# Patient Record
Sex: Female | Born: 1980
Health system: Southern US, Community
[De-identification: ages and names within clinical notes are randomized; demographics above are authoritative.]

## PROBLEM LIST (undated history)

## (undated) DIAGNOSIS — G35 Multiple sclerosis: Secondary | ICD-10-CM

## (undated) DIAGNOSIS — IMO0001 Reserved for inherently not codable concepts without codable children: Secondary | ICD-10-CM

## (undated) DIAGNOSIS — R002 Palpitations: Secondary | ICD-10-CM

## (undated) DIAGNOSIS — I639 Cerebral infarction, unspecified: Secondary | ICD-10-CM

## (undated) DIAGNOSIS — I10 Essential (primary) hypertension: Secondary | ICD-10-CM

## (undated) DIAGNOSIS — F419 Anxiety disorder, unspecified: Secondary | ICD-10-CM

## (undated) DIAGNOSIS — E7800 Pure hypercholesterolemia, unspecified: Secondary | ICD-10-CM

## (undated) DIAGNOSIS — E785 Hyperlipidemia, unspecified: Secondary | ICD-10-CM

## (undated) DIAGNOSIS — Z8719 Personal history of other diseases of the digestive system: Secondary | ICD-10-CM

## (undated) HISTORY — PX: LAPAROSCOPIC TUBAL LIGATION: SUR803

## (undated) HISTORY — DX: Personal history of other diseases of the digestive system: Z87.19

## (undated) HISTORY — PX: BLADDER NECK SUSPENSION: SHX1240

## (undated) HISTORY — DX: Anxiety disorder, unspecified: F41.9

## (undated) HISTORY — DX: Palpitations: R00.2

## (undated) HISTORY — PX: ABDOMINAL HYSTERECTOMY: SHX81

## (undated) HISTORY — DX: Hyperlipidemia, unspecified: E78.5

## (undated) HISTORY — PX: CHOLECYSTECTOMY: SHX55

---

## 2008-03-03 ENCOUNTER — Encounter (INDEPENDENT_AMBULATORY_CARE_PROVIDER_SITE_OTHER): Payer: Self-pay | Admitting: Urology

## 2008-03-03 ENCOUNTER — Ambulatory Visit (HOSPITAL_BASED_OUTPATIENT_CLINIC_OR_DEPARTMENT_OTHER): Admission: RE | Admit: 2008-03-03 | Discharge: 2008-03-03 | Payer: Self-pay | Admitting: Urology

## 2009-04-06 ENCOUNTER — Emergency Department (HOSPITAL_COMMUNITY): Admission: EM | Admit: 2009-04-06 | Discharge: 2009-04-06 | Payer: Self-pay | Admitting: Emergency Medicine

## 2010-09-04 LAB — URINALYSIS, ROUTINE W REFLEX MICROSCOPIC
Bilirubin Urine: NEGATIVE
Glucose, UA: NEGATIVE mg/dL
Ketones, ur: NEGATIVE mg/dL
Leukocytes, UA: NEGATIVE
Nitrite: NEGATIVE
pH: 6 (ref 5.0–8.0)

## 2010-09-04 LAB — URINE CULTURE

## 2010-09-04 LAB — URINE MICROSCOPIC-ADD ON

## 2010-09-04 LAB — PREGNANCY, URINE: Preg Test, Ur: NEGATIVE

## 2010-10-15 NOTE — Op Note (Signed)
[name], Lindsey Phelps             ACCOUNT NO.:  [id number]   MEDICAL RECORD NO.:  [id number]          PATIENT TYPE:  AMB   LOCATION:  [facility]                         FACILITY:  [facility]   PHYSICIAN:  Ronald L. Earlene Plater, M.D.  DATE OF BIRTH:  [birth year]   DATE OF PROCEDURE:  03/03/2008  DATE OF DISCHARGE:                               OPERATIVE REPORT   DIAGNOSIS:  Possible interstitial cystitis.   OPERATIVE PROCEDURE:  Cystourethroscopy, hydraulic bladder distention,  bladder biopsy.   SURGEON:  Lucrezia Starch. Earlene Plater, M.D.   ANESTHESIA:  LMA.   ESTIMATED BLOOD LOSS:  Negligible.   TUBES:  None.   COMPLICATIONS:  None.   FINDINGS:  A 600 mL capacity bladder at 80 cm water pressure, submucosal  glomerulations noted after distention.   INDICATIONS FOR PROCEDURE:  Lindsey Phelps is a lovely 30 year old white female  who presents with urinary symptoms.  She has had recurrent cystitis  since she was quite young.  She has had 2 episodes of gross hematuria.  She has also had suprapubic pressure relieved by voiding; urgency,  frequency and some dysuria.  Her workup has been essentially negative  and we are suspicious of interstitial cystitis.  After understanding the  risks, benefits and alternatives, she has elected to undergo cysto,  hydraulic bladder distention and bladder biopsy for both therapeutic and  diagnostic reasons.   DESCRIPTION OF PROCEDURE:  The patient was placed in supine position.  After appropriate LMA anesthesia, was placed in the dorsal lithotomy  position and prepped and draped with Betadine in a sterile fashion.  Cystourethroscopy was done with a 22.5-French Olympus panendoscope.  Utilizing the 12 and 70-degree lenses, the bladder was carefully  inspected and noted be without lesions.  Efflux of clear urine was noted  from the normally-placed ureteral orifices bilaterally.  There was some  mild circumferential urethritis.  The bladder was then carefully  distended to 80 cm of  water pressure with sterile water.  She had a  total capacity of 600 mL.  The bladder was drained.  Multiple submucosal  glomerulations were noted throughout the bladder and several areas of  mild cracking and bleeding were noted, consistent with interstitial  cystitis.  A biopsy was taken from the posterior midline with  cold cup biopsy forceps and submitted to pathology, and the base was  cauterized with Bugbee coagulation cautery.  Reinspection revealed there  were no other lesions and no significant bleeding was noted.  The  bladder was drained.  The panendoscope was removed and the patient was  taken to the recovery room stable.      Ronald L. Earlene Plater, M.D.  Electronically Signed     RLD/MEDQ  D:  03/03/2008  T:  03/04/2008  Job:  [id number]

## 2010-10-30 ENCOUNTER — Other Ambulatory Visit (HOSPITAL_COMMUNITY): Payer: Self-pay | Admitting: Family Medicine

## 2010-10-30 ENCOUNTER — Ambulatory Visit (HOSPITAL_COMMUNITY)
Admission: RE | Admit: 2010-10-30 | Discharge: 2010-10-30 | Disposition: A | Discharge status: Home / Self Care | Payer: Managed Care, Other (non HMO) | Source: Ambulatory Visit | Attending: Family Medicine | Admitting: Family Medicine

## 2010-10-30 DIAGNOSIS — R1031 Right lower quadrant pain: Secondary | ICD-10-CM

## 2010-10-30 DIAGNOSIS — N83209 Unspecified ovarian cyst, unspecified side: Secondary | ICD-10-CM | POA: Insufficient documentation

## 2010-10-30 DIAGNOSIS — R932 Abnormal findings on diagnostic imaging of liver and biliary tract: Secondary | ICD-10-CM | POA: Insufficient documentation

## 2010-10-30 MED ORDER — IOHEXOL 300 MG/ML  SOLN
100.0000 mL | Freq: Once | INTRAMUSCULAR | Status: AC | PRN
  Administered 2010-10-30: 100 mL via INTRAVENOUS

## 2010-12-17 ENCOUNTER — Encounter (INDEPENDENT_AMBULATORY_CARE_PROVIDER_SITE_OTHER): Payer: Self-pay | Admitting: Internal Medicine

## 2010-12-17 ENCOUNTER — Ambulatory Visit (INDEPENDENT_AMBULATORY_CARE_PROVIDER_SITE_OTHER): Payer: Managed Care, Other (non HMO) | Admitting: Internal Medicine

## 2010-12-17 DIAGNOSIS — K5190 Ulcerative colitis, unspecified, without complications: Secondary | ICD-10-CM

## 2010-12-18 NOTE — Consult Note (Unsigned)
NAME:  Lindsey Phelps, Lindsey Phelps                    ACCOUNT NO.:  MEDICAL RECORD NO.:  [id number]  LOCATION:  [facility]                           FACILITY:  [facility]  PHYSICIAN:  Lionel December, M.D.    DATE OF BIRTH:  05/28/1981  DATE OF CONSULTATION: DATE OF DISCHARGE:  10/30/2010                                CONSULTATION   REASON FOR CONSULTATION:  Colitis.  HISTORY OF PRESENT ILLNESS:  Lindsey Phelps is a 30 year old female referred to our office by Dr. Phillips Odor, for colitis.  At the first of June of this year, Bonnnie began to have right lower quadrant pain and back pain. She thought this was a kidney stone.  She underwent a CT of the abdomen which revealed questionable wall thickening and minimal pericolic inflammatory changes of the sigmoid and rectum, raising question of colitis.  No masses, adenopathy, or free fluid.  She had a 2.5 x 2.1 cm right ovarian cyst.  She was seen by her GYN and was told she had a normal exam.  Her pain lasted approximately 3 days.  During that time, she had multiple stools and also had diarrhea.  She does tell me she keeps diarrhea most of the time.  She has had diarrhea since 2011, after undergoing a cholecystectomy.  Today, she tells me she does have right- sided abdominal pain after she eats.  When she has this pain, she will usually have a bowel movement.  She is having anywhere from 3-6 stools a day and they are loose.  Sometimes they are watery.  She does tell me if she takes pain medicines, her stools are normal.  She has had no severe abdominal pain for month.  She does have a gassy feeling.  Her appetite has remained good.  There has been no weight loss.  No recent fever, no melena, or rectal bleeding.  HOME MEDICATIONS: 1. Nitrofurantoin 50 mg one a day. 2. Diazepam 5 mg at night. 3. Wellbutrin 150 mg b.i.d. for smoking cessation.  SURGERIES:  She underwent a hysterectomy in August of 2011, she tells me an incompetent cervix.  She had a cholecystectomy in  2009.  She had a bilateral tubal ligation.  She had a bladder distention in 2009.  MEDICAL HISTORY:  Interstitial cystitis.  FAMILY HISTORY:  Her mother and father are alive in good health.  Two brothers in good health with a history of coronary artery disease.  SOCIAL HISTORY:  She is married.  She is employed by [employer].  She smokes less than a pack a day x15 years.  She is however trying to quit smoking.  She occasionally drinks alcohol.  No children.  There is no family history of Crohn's.  Her father has history of colon polyps and diverticulitis.  OBJECTIVE:  VITAL SIGNS:  Her height is 5 feet 6 inches, her weight is 142.9, her blood pressure is 96/72, her temperature is 99.2, her pulse is 68. GENERAL:  She has natural teeth. HEENT:  Her oral mucosa is moist.  There are no lesions.  Her conjunctivae are pink.  Her sclerae are anicteric.  Her thyroid is normal.  There is no cervical lymphadenopathy.  LUNGS:  Clear. HEART:  Regular rate and rhythm. ABDOMEN:  Soft.  Bowel sounds are positive.  I do not feel any masses. She has very, very slight tenderness on palpation to the right lower quadrant. EXTREMITIES:  There is no edema to her lower extremities.  Her stool was guaiac negative today.  ASSESSMENT:  [name] is a 30 year old female presenting with an abnormal CT which revealed colitis.  An inflammatory disease such as Crohn's or ulcerative colitis needs to be ruled out.  I did discuss this case with Dr. Karilyn Cota.  RECOMMENDATIONS:  We will schedule a colonoscopy in the near future. The risk and benefits were reviewed with the patient and she is agreeable.  She will also start dicyclomine 10 mg twice a day #60.    ______________________________ Dorene Ar, NP   ______________________________ Lionel December, M.D.    TS/MEDQ  D:  12/17/2010  T:  12/18/2010  Job:  [id number]  cc:   Dr. Phillips Odor

## 2011-01-22 MED ORDER — SODIUM CHLORIDE 0.45 % IV SOLN
Freq: Once | INTRAVENOUS | Status: AC
  Administered 2011-01-23: 1000 mL via INTRAVENOUS

## 2011-01-23 ENCOUNTER — Encounter (HOSPITAL_COMMUNITY): Payer: Self-pay | Admitting: *Deleted

## 2011-01-23 ENCOUNTER — Encounter (HOSPITAL_COMMUNITY)
Admission: RE | Disposition: A | Discharge status: Home / Self Care | Payer: Self-pay | Source: Ambulatory Visit | Attending: Internal Medicine

## 2011-01-23 ENCOUNTER — Ambulatory Visit (HOSPITAL_COMMUNITY)
Admission: RE | Admit: 2011-01-23 | Discharge: 2011-01-23 | Disposition: A | Discharge status: Home / Self Care | Payer: Managed Care, Other (non HMO) | Source: Ambulatory Visit | Attending: Internal Medicine | Admitting: Internal Medicine

## 2011-01-23 DIAGNOSIS — J449 Chronic obstructive pulmonary disease, unspecified: Secondary | ICD-10-CM | POA: Insufficient documentation

## 2011-01-23 DIAGNOSIS — R859 Unspecified abnormal finding in specimens from digestive organs and abdominal cavity: Secondary | ICD-10-CM

## 2011-01-23 DIAGNOSIS — I10 Essential (primary) hypertension: Secondary | ICD-10-CM | POA: Insufficient documentation

## 2011-01-23 DIAGNOSIS — E119 Type 2 diabetes mellitus without complications: Secondary | ICD-10-CM | POA: Insufficient documentation

## 2011-01-23 DIAGNOSIS — R197 Diarrhea, unspecified: Secondary | ICD-10-CM | POA: Insufficient documentation

## 2011-01-23 DIAGNOSIS — J4489 Other specified chronic obstructive pulmonary disease: Secondary | ICD-10-CM | POA: Insufficient documentation

## 2011-01-23 DIAGNOSIS — R933 Abnormal findings on diagnostic imaging of other parts of digestive tract: Secondary | ICD-10-CM | POA: Insufficient documentation

## 2011-01-23 DIAGNOSIS — K5289 Other specified noninfective gastroenteritis and colitis: Secondary | ICD-10-CM

## 2011-01-23 HISTORY — PX: COLONOSCOPY: SHX5424

## 2011-01-23 SURGERY — COLONOSCOPY
Anesthesia: Moderate Sedation

## 2011-01-23 MED ORDER — DICYCLOMINE HCL 10 MG PO CAPS: 10.0000 mg | ORAL_CAPSULE | Freq: Three times a day (TID) | ORAL | Status: DC

## 2011-01-23 MED ORDER — MIDAZOLAM HCL 5 MG/5ML IJ SOLN
INTRAMUSCULAR | Status: AC
  Filled 2011-01-23: qty 10

## 2011-01-23 MED ORDER — FENTANYL CITRATE 0.05 MG/ML IJ SOLN
INTRAMUSCULAR | Status: AC
  Filled 2011-01-23: qty 2

## 2011-01-23 MED ORDER — FENTANYL CITRATE 0.05 MG/ML IJ SOLN
INTRAMUSCULAR | Status: DC | PRN
  Administered 2011-01-23 (×4): 25 ug via INTRAVENOUS

## 2011-01-23 MED ORDER — MIDAZOLAM HCL 5 MG/5ML IJ SOLN
INTRAMUSCULAR | Status: DC | PRN
  Administered 2011-01-23 (×5): 2 mg via INTRAVENOUS

## 2011-01-23 NOTE — Op Note (Signed)
COLONOSCOPY PROCEDURE REPORT  PATIENT:  Lindsey Phelps  MR#:  [id number] Birthdate:  Feb 14, 1981, 30 y.o., female Endoscopist:  Dr. Malissa Hippo, MD Referred By:  Loren Racer, MD  Procedure Date: 01/23/2011  Procedure:   Colonoscopy  Indications:  Diarrhea and thickened segments of colon on CT; negative stool studies.  Informed Consent:  Procedure and risks were reviewed with the patient and informed consent was obtained.  Medications:  Fentanyl 100 mcg IV. Versed 10  mg IV  Description of procedure:  After a digital rectal exam was performed, that colonoscope was advanced from the anus through the rectum and colon to the area of the cecum, ileocecal valve and appendiceal orifice. The cecum was deeply intubated. These structures were well-seen and photographed for the record. From the level of the cecum and ileocecal valve, the scope was slowly and cautiously withdrawn. The mucosal surfaces were carefully surveyed utilizing scope tip to flexion to facilitate fold flattening as needed. The scope was pulled down into the rectum where a thorough exam including retroflexion was performed.  Findings:   Prep excellent. Normal mucosa of terminal ileum. Normal colorectal mucosa. Normal anorectal junction.  Therapeutic/Diagnostic Maneuvers Performed:  None  Complications:  None  Cecal Withdrawal Time:  8 minutes  Impression:  Normal terminal ileum. Normal colonoscopy. She possibly had self-limiting enterocolitis which would account for CT findings. Current symptoms  secondary to irritable bowel syndrome.  Recommendations:  Resume usual medications and diet. Dicyclomine 10 mg before each meal. Office visit in 8 week  REHMAN,NAJEEB U  01/23/2011 4:13 PM  CC: Dr. Colette Ribas, MD

## 2011-01-23 NOTE — H&P (Signed)
Lindsey Phelps is an 30 y.o. female.   Chief Complaint: Patient is here for colonoscopy HPI: Patient is 29 year old Asian female who is here for diagnostic colonoscopy. In May of this year she developed pain in right side of her abdomen. She thought she had kidney stone. She went to emergency room and had abdominopelvic CT which showed changes of colitis no evidence of kidney stone to her pain has resolved however she still has postprandial diarrhea therefore undergoing this exam to rule out inflammatory bowel disease also has intermittent nocturnal bowel movement. She denies rectal bleeding or melena. she says she has gained few pounds since she had hysterectomy one year ago . She does not take any NSAIDs on regular basis. Him history is negative for inflammatory bowel disease.  History reviewed. No pertinent past medical history.  Past Surgical History  Procedure Date  . Abdominal hysterectomy   . Cholecystectomy   . Bladder neck suspension   . Laparoscopic tubal ligation     Family History  Problem Relation Age of Onset  . Diabetes Mother   . Hypertension Mother   . Coronary artery disease Mother   . COPD Mother   . Diabetes Father   . Hypertension Father   . Colon polyps Father    Social History:  reports that she has been smoking Cigarettes.  She has a 7.5 pack-year smoking history. She does not have any smokeless tobacco history on file. She reports that she drinks about .6 ounces of alcohol per week. She reports that she does not use illicit drugs.  Allergies:  Allergies  Allergen Reactions  . Darvocet (Propoxyphene N-Acetaminophen)   . Demerol     Medications Prior to Admission  Medication Dose Route Frequency Provider Last Rate Last Dose  . 0.45 % sodium chloride infusion   Intravenous Once Malissa Hippo, MD 20 mL/hr at 01/23/11 1429 1,000 mL at 01/23/11 1429  . midazolam (VERSED) 5 MG/5ML injection            Medications Prior to Admission  Medication Sig Dispense  Refill  . buPROPion (WELLBUTRIN SR) 150 MG 12 hr tablet Take 150 mg by mouth 2 (two) times daily.        . nitrofurantoin (MACRODANTIN) 50 MG capsule Take 50 mg by mouth 1 day or 1 dose. As needed       . diazepam (VALIUM) 5 MG tablet Take 5 mg by mouth at bedtime as needed.          No results found for this or any previous visit (from the past 48 hour(s)). No results found.  Review of Systems  Constitutional: Negative for fever and weight loss.  Gastrointestinal: Positive for diarrhea. Negative for constipation, blood in stool and melena.    Blood pressure 118/74, pulse 82, temperature 98.6 F (37 C), temperature source Oral, resp. rate 16, height 5\' 6"  (1.676 m), weight 142 lb (64.411 kg), SpO2 100.00%. Physical Exam  Constitutional: She appears well-developed and well-nourished.  HENT:  Mouth/Throat: Oropharynx is clear and moist.  Eyes: Conjunctivae are normal. No scleral icterus.  Neck: No thyromegaly present.  Cardiovascular: Normal rate, regular rhythm and normal heart sounds.   No murmur heard. Respiratory: Breath sounds normal.  GI: Soft. She exhibits no distension and no mass. There is no tenderness.       Has tattoo in RUQ  Lymphadenopathy:    She has no cervical adenopathy.  Neurological: She is alert.  Skin: Skin is warm and dry.  Assessment/Plan Diarrhea and abnormal abdominal CT revealing changes of colitis. Colonoscopy  REHMAN,NAJEEB U 01/23/2011, 3:40 PM

## 2011-01-28 ENCOUNTER — Encounter (HOSPITAL_COMMUNITY): Payer: Self-pay | Admitting: Internal Medicine

## 2011-03-04 LAB — POCT PREGNANCY, URINE: Preg Test, Ur: NEGATIVE

## 2011-03-10 ENCOUNTER — Encounter (INDEPENDENT_AMBULATORY_CARE_PROVIDER_SITE_OTHER): Payer: Self-pay | Admitting: Internal Medicine

## 2011-03-10 ENCOUNTER — Ambulatory Visit (INDEPENDENT_AMBULATORY_CARE_PROVIDER_SITE_OTHER): Payer: Managed Care, Other (non HMO) | Admitting: Internal Medicine

## 2011-03-10 VITALS — BP 98/52 | HR 80 | Temp 98.7°F | Ht 66.0 in | Wt 141.0 lb

## 2011-03-10 DIAGNOSIS — K589 Irritable bowel syndrome without diarrhea: Secondary | ICD-10-CM

## 2011-03-10 NOTE — Progress Notes (Signed)
Subjective:     Patient ID: Lindsey Phelps, female   DOB: 06-02-1981, 30 y.o.   MRN: [id number]  HPI  Lindsey Phelps is a 30 yr old female here today for f/u after recently undergoing a colonoscopy for colitis. She had been having rt lower quadrant pain and  Back pain.  She underwent a CT scan of the abdomen which revealed questionable wall thickening and minimal pericolic colitis.  She tells me the Dicyclomine is helping. She really does not have pain since starting the Dicyclomine.  She is having a watery stool about once every other weeks. Her stools have firmed up.  She is taking the Dicyclomine on a prn basis. She is satisfied at this point. Her appetite is good. No weight loss. She has a BM about once a day or twice a day. Normal.   Colonoscopy 01/23/11 Impression:  Normal terminal ileum.  Normal colonoscopy.  She possibly had self-limiting enterocolitis which would account for CT findings.  Current symptoms secondary to irritable bowel syndrome.     Review of Systems see hpi    Objective:   Physical Exam Alert and oriented. Skin warm and dry. Oral mucosa is moist. Natural teeth in good condition. Sclera anicteric, conjunctivae is pink. Thyroid not enlarged. No cervical lymphadenopathy. Lungs clear. Heart regular rate and rhythm.  Abdomen is soft. Bowel sounds are positive. No hepatomegaly. No abdominal masses felt. No tenderness.  No edema to lower extremities. Patient is alert and oriented.      Assessment:    Possible IBS.  Symptoms are much better since starting the Dicyclomine.    Plan:     Continue the Dicyclomine.    Patient is satisified with her care.  She will have an office visit in one year unless she has further GI problems.

## 2011-03-10 NOTE — Patient Instructions (Signed)
Irritable Bowel Syndrome   (Spastic Colon)   Irritable Bowel Syndrome (IBS) is caused by a disturbance of normal bowel function. Other terms used are spastic colon, mucous colitis, and irritable colon. It does not require surgery, nor does it lead to cancer. There is no cure for IBS. But with proper diet, stress reduction, and medication, you will find that your problems (symptoms) will gradually disappear or improve. IBS is a common digestive disorder. It usually appears in late adolescence or early adulthood. Women develop it twice as often as men.   CAUSES   After food has been digested and absorbed in the small intestine, waste material is moved into the colon (large intestine). In the colon, water and salts are absorbed from the undigested products coming from the small intestine. The remaining residue, or fecal material, is held for elimination. Under normal circumstances, gentle, rhythmic contractions on the bowel walls push the fecal material along the colon towards the rectum. In IBS, however, these contractions are irregular and poorly coordinated. The fecal material is either retained too long, resulting in constipation, or expelled too soon, producing diarrhea.   SYMPTOMS   The most common symptom of IBS is pain. It is typically in the lower left side of the belly (abdomen). But it may occur anywhere in the abdomen. It can be felt as heartburn, backache, or even as a dull pain in the arms or shoulders. The pain comes from excessive bowel-muscle spasms and from the buildup of gas and fecal material in the colon. This pain:   Can range from sharp belly (abdominal) cramps to a dull, continuous ache.   Usually worsens soon after eating.   Is typically relieved by having a bowel movement or passing gas.   Abdominal pain is usually accompanied by constipation. But it may also produce diarrhea. The diarrhea typically occurs right after a meal or upon arising in the morning. The stools are typically soft and  watery. They are often flecked with secretions (mucus).   Other symptoms of IBS include:   Bloating.   Loss of appetite.  Heartburn.   Feeling sick to your stomach (nausea).  Belching   Vomiting  Gas.    IBS may also cause a number of symptoms that are unrelated to the digestive system:   Fatigue.   Headaches.  Anxiety   Shortness of breath  Difficulty in concentrating.   Dizziness.    These symptoms tend to come and go.   DIAGNOSIS   The symptoms of IBS closely mimic the symptoms of other, more serious digestive disorders. So your caregiver may wish to perform a variety of additional tests to exclude these disorders. He/she wants to be certain of learning what is wrong (diagnosis). The nature and purpose of each test will be explained to you.   TREATMENT   A number of medications are available to help correct bowel function and/or relieve bowel spasms and abdominal pain. Among the drugs available are:   Mild, non-irritating laxatives for severe constipation and to help restore normal bowel habits.   Specific anti-diarrheal medications to treat severe or prolonged diarrhea.   Anti-spasmodic agents to relieve intestinal cramps.   Your caregiver may also decide to treat you with a mild tranquilizer or sedative during unusually stressful periods in your life.   The important thing to remember is that if any drug is prescribed for you, make sure that you take it exactly as directed. Make sure that your caregiver knows how well it worked   for you.   HOME CARE INSTRUCTIONS   Avoid foods that are high in fat or oils. Some examples are:heavy cream, butter, frankfurters, sausage, and other fatty meats.   Avoid foods that have a laxative effect, such as fruit, fruit juice, and dairy products.   Cut out carbonated drinks, chewing gum, and "gassy" foods, such as beans and cabbage. This may help relieve bloating and belching.   Bran taken with plenty of liquids may help relieve constipation.   Keep track of what foods seem to  trigger your symptoms.   Avoid emotionally charged situations or circumstances that produce anxiety.   Start or continue exercising.   Get plenty of rest and sleep.   MAKE SURE YOU:   Understand these instructions.   Will watch your condition.   Will get help right away if you are not doing well or get worse.   Document Released: 05/19/2005 Document Re-Released: 10/05/2008   [employer]® Patient Information ©2011 [employer].

## 2011-11-12 ENCOUNTER — Encounter: Payer: Self-pay | Admitting: *Deleted

## 2012-03-10 ENCOUNTER — Encounter (INDEPENDENT_AMBULATORY_CARE_PROVIDER_SITE_OTHER): Payer: Self-pay | Admitting: *Deleted

## 2012-03-25 ENCOUNTER — Ambulatory Visit (INDEPENDENT_AMBULATORY_CARE_PROVIDER_SITE_OTHER): Payer: Managed Care, Other (non HMO) | Admitting: Internal Medicine

## 2012-04-07 ENCOUNTER — Encounter (INDEPENDENT_AMBULATORY_CARE_PROVIDER_SITE_OTHER): Payer: Self-pay | Admitting: Internal Medicine

## 2012-04-07 ENCOUNTER — Ambulatory Visit (INDEPENDENT_AMBULATORY_CARE_PROVIDER_SITE_OTHER): Payer: Managed Care, Other (non HMO) | Admitting: Internal Medicine

## 2012-04-07 VITALS — BP 102/70 | HR 60 | Temp 98.1°F | Ht 66.0 in | Wt 146.9 lb

## 2012-04-07 DIAGNOSIS — K5289 Other specified noninfective gastroenteritis and colitis: Secondary | ICD-10-CM

## 2012-04-07 DIAGNOSIS — K529 Noninfective gastroenteritis and colitis, unspecified: Secondary | ICD-10-CM

## 2012-04-07 NOTE — Patient Instructions (Addendum)
May follow up on a prn basis. 

## 2012-04-07 NOTE — Progress Notes (Signed)
Subjective:     Patient ID: Lindsey Phelps, female   DOB: 26-Feb-1981, 31 y.o.   MRN: [id number]  HPI Here today for f/u. She was seen in May of this for follow up. She underwent a  CT of the abdomen which revealed questionable wall thickening and minimal pericolic colitis.  She usually has a BM about twice a day and are solid Appetite is good. No weight loss. She has actually gained weight. No GI problems.    Colonoscopy 01/23/11  Impression:  Normal terminal ileum.  Normal colonoscopy.  She possibly had self-limiting enterocolitis which would account for CT findings.  Current symptoms secondary to irritable bowel syndrome.   Review of Systems see hpi Current Outpatient Prescriptions  Medication Sig Dispense Refill  . nitrofurantoin (MACRODANTIN) 50 MG capsule Take 50 mg by mouth 1 day or 1 dose. As needed       . dicyclomine (BENTYL) 10 MG capsule Take 10 mg by mouth as needed.      . [DISCONTINUED] dicyclomine (BENTYL) 10 MG capsule Take 1 capsule (10 mg total) by mouth 3 (three) times daily before meals.  90 capsule  5   Past Medical History  Diagnosis Date  . History of IBS   . Heart palpitations    Past Surgical History  Procedure Date  . Abdominal hysterectomy   . Cholecystectomy   . Bladder neck suspension   . Laparoscopic tubal ligation   . Colonoscopy 01/23/2011    Procedure: COLONOSCOPY;  Surgeon: Malissa Hippo, MD;  Location: [facility];  Service: Endoscopy;  Laterality: N/A;   History   Social History  . Marital Status: Married    Spouse Name: N/A    Number of Children: N/A  . Years of Education: N/A   Occupational History  . Not on file.   Social History Main Topics  . Smoking status: Current Every Day Smoker -- 0.5 packs/day for 15 years    Types: Cigarettes  . Smokeless tobacco: Not on file  . Alcohol Use: 0.6 oz/week    1 Shots of liquor per week     Comment: every few months  . Drug Use: No  . Sexually Active:    Other Topics Concern  .  Not on file   Social History Narrative  . No narrative on file   Family Status  Relation Status Death Age  . Father Alive    Allergies  Allergen Reactions  . Darvocet (Propoxyphene-Acetaminophen)   . Demerol         Objective:   Physical Exam  Filed Vitals:   04/07/12 1039  BP: 102/70  Pulse: 60  Temp: 98.1 F (36.7 C)  Height: 5\' 6"  (1.676 m)  Weight: 146 lb 14.4 oz (66.633 kg)   Alert and oriented. Skin warm and dry. Oral mucosa is moist.   . Sclera anicteric, conjunctivae is pink. Thyroid not enlarged. No cervical lymphadenopathy. Lungs clear. Heart regular rate and rhythm.  Abdomen is soft. Bowel sounds are positive. No hepatomegaly. No abdominal masses felt. No tenderness.  No edema to lower extremities      Assessment:   Colitis which has resolved. No GI problems.    Plan:    Follow up on a prn basis.

## 2013-09-15 ENCOUNTER — Encounter: Payer: Self-pay | Admitting: *Deleted

## 2013-09-16 ENCOUNTER — Encounter: Payer: Self-pay | Admitting: Cardiovascular Disease

## 2013-09-16 ENCOUNTER — Ambulatory Visit (INDEPENDENT_AMBULATORY_CARE_PROVIDER_SITE_OTHER): Payer: BC Managed Care – PPO | Admitting: Cardiovascular Disease

## 2013-09-16 ENCOUNTER — Encounter: Payer: Self-pay | Admitting: *Deleted

## 2013-09-16 VITALS — BP 122/84 | HR 82 | Ht 66.0 in | Wt 184.0 lb

## 2013-09-16 DIAGNOSIS — R000 Tachycardia, unspecified: Secondary | ICD-10-CM

## 2013-09-16 DIAGNOSIS — R42 Dizziness and giddiness: Secondary | ICD-10-CM

## 2013-09-16 DIAGNOSIS — R002 Palpitations: Secondary | ICD-10-CM

## 2013-09-16 DIAGNOSIS — Z136 Encounter for screening for cardiovascular disorders: Secondary | ICD-10-CM

## 2013-09-16 DIAGNOSIS — R079 Chest pain, unspecified: Secondary | ICD-10-CM

## 2013-09-16 NOTE — Progress Notes (Signed)
Patient ID: Lindsey Phelps, female   DOB: [birth year], 33 y.o.   MRN: [id number]       CARDIOLOGY CONSULT NOTE  Patient ID: Lindsey Phelps MRN: [id number] DOB/AGE: [birth year] 33 y.o.  Admit date: (Not on file) Primary Physician Colette Ribas, MD  Reason for Consultation: chest pain, palpitations, presyncope  HPI: The patient is a 33 year old woman who has been referred for chest pain, palpitations, and presyncope. She has been experiencing intermittent palpitations for the past 4 years. 4 years ago, she was reportedly hospitalized at [facility] for palpitations. An echocardiogram was reportedly done and she says it showed "3 leaky valves". She was told to quit smoking cigarettes and to reduce caffeine consumption. She quit smoking one year ago and has not had any caffeinated beverages for the past 2 months. She said for the past year, she has been experiencing more severe and frequent palpitations. She describes it as a rapid heart rate in the center of her chest which then "feels like the heart is flipping up and around" to the base of her neck. She has associated shortness of breath and dizziness and lightheadedness, and feels like she is going to pass out. She said that she did pass out 2 years ago due to this. The last time she wore a heart monitor was 4 years ago. She also describes intermittent chest pressure which causes a fullness in her chest and radiates to her neck and head. This can occur without exertion. Her palpitations also occur spontaneously and without exertion.  ECG today shows normal sinus rhythm, PR interval 104 ms, heart rate 82 beats per minute. There is an RSR prime pattern in V1 and V2.  Soc: Quit smoking one year ago. Works at [employer] in [place].  Fam: Brother had MI at age 53 and received two stents, is a smoker.    Allergies  Allergen Reactions  . Adhesive [Tape]     Including bandaids  . Darvocet [Propoxyphene N-Acetaminophen]   . Demerol      Current Outpatient Prescriptions  Medication Sig Dispense Refill  . cetirizine (ZYRTEC) 10 MG tablet Take 10 mg by mouth as needed for allergies.       No current facility-administered medications for this visit.    Past Medical History  Diagnosis Date  . History of IBS   . Heart palpitations   . Anxiety     Past Surgical History  Procedure Laterality Date  . Abdominal hysterectomy    . Cholecystectomy    . Bladder neck suspension    . Laparoscopic tubal ligation    . Colonoscopy  01/23/2011    Procedure: COLONOSCOPY;  Surgeon: Malissa Hippo, MD;  Location: [facility];  Service: Endoscopy;  Laterality: N/A;    History   Social History  . Marital Status: Married    Spouse Name: N/A    Number of Children: N/A  . Years of Education: N/A   Occupational History  . Not on file.   Social History Main Topics  . Smoking status: Former Smoker -- 0.50 packs/day for 15 years    Types: Cigarettes    Quit date: 09/16/2012  . Smokeless tobacco: Not on file  . Alcohol Use: 0.6 oz/week    1 Shots of liquor per week     Comment: every few months  . Drug Use: No  . Sexual Activity: Not on file   Other Topics Concern  . Not on file   Social History Narrative  .  No narrative on file     No family history of premature CAD in 1st degree relatives.  Prior to Admission medications   Medication Sig Start Date End Date Taking? Authorizing Provider  cetirizine (ZYRTEC) 10 MG tablet Take 10 mg by mouth as needed for allergies.   Yes Historical Provider, MD        Physical exam Blood pressure 122/84, pulse 82, height 5\' 6"  (1.676 m), weight 184 lb (83.462 kg). General: NAD Neck: No JVD, no thyromegaly or thyroid nodule.  Lungs: Clear to auscultation bilaterally with normal respiratory effort. CV: Nondisplaced PMI.  Heart regular S1/S2, no S3/S4, no murmur.  No peripheral edema.  No carotid bruit.  Normal pedal pulses.  Abdomen: Soft, nontender, no  hepatosplenomegaly, no distention.  Skin: Intact without lesions or rashes.  Neurologic: Alert and oriented x 3.  Psych: Normal affect. Extremities: No clubbing or cyanosis.  HEENT: Normal.   Labs:   Lab Results  Component Value Date   HGB 13.7 03/03/2008   No results found for this basename: NA, K, CL, CO2, BUN, CREATININE, CALCIUM, LABALBU, PROT, BILITOT, ALKPHOS, ALT, AST, GLUCOSE,  in the last 168 hours No results found for this basename: CKTOTAL, CKMB, CKMBINDEX, TROPONINI    No results found for this basename: CHOL   No results found for this basename: HDL   No results found for this basename: LDLCALC   No results found for this basename: TRIG   No results found for this basename: CHOLHDL   No results found for this basename: LDLDIRECT         Studies: No results found.  ASSESSMENT AND PLAN:  1. Tachycardia and palpitations: In order to rule out a symptomatic sinus tachycardia and supraventricular arrhythmia, I will proceed with a 30 day event monitor. I will also obtain an echocardiogram to evaluate for structural heart disease. 2. Chest pain: Her symptoms are somewhat atypical for ischemic heart disease. Her risk factors include a prior history of tobacco use and a family history of premature coronary artery disease. In order to be thorough, I will proceed with an exercise treadmill stress test.  Dispo: f/u 6 weeks.  Signed: Prentice DockerSuresh Cartina Brousseau, M.D., F.A.C.C.  09/16/2013, 2:59 PM

## 2013-09-16 NOTE — Patient Instructions (Signed)
Your physician has requested that you have an echocardiogram. Echocardiography is a painless test that uses sound waves to create images of your heart. It provides your doctor with information about the size and shape of your heart and how well your heart's chambers and valves are working. This procedure takes approximately one hour. There are no restrictions for this procedure. Your physician has requested that you have an exercise tolerance test. For further information please visit [email or web address] Please also follow instruction sheet, as given. Your physician has recommended that you wear a 30 day event monitor. Event monitors are medical devices that record the heart's electrical activity. Doctors most often [place] these monitors to diagnose arrhythmias. Arrhythmias are problems with the speed or rhythm of the heartbeat. The monitor is a small, portable device. You can wear one while you do your normal daily activities. This is usually used to diagnose what is causing palpitations/syncope (passing out). Office will contact with results via phone or letter.   Follow up in  6 weeks

## 2013-09-21 ENCOUNTER — Other Ambulatory Visit (HOSPITAL_COMMUNITY): Payer: BC Managed Care – PPO

## 2013-09-21 ENCOUNTER — Other Ambulatory Visit: Payer: Self-pay | Admitting: *Deleted

## 2013-09-21 DIAGNOSIS — R002 Palpitations: Secondary | ICD-10-CM

## 2013-09-22 ENCOUNTER — Ambulatory Visit (HOSPITAL_COMMUNITY)
Admission: RE | Admit: 2013-09-22 | Discharge: 2013-09-22 | Disposition: A | Discharge status: Home / Self Care | Payer: BC Managed Care – PPO | Source: Ambulatory Visit | Attending: Cardiovascular Disease | Admitting: Cardiovascular Disease

## 2013-09-22 DIAGNOSIS — Z87891 Personal history of nicotine dependence: Secondary | ICD-10-CM | POA: Insufficient documentation

## 2013-09-22 DIAGNOSIS — R079 Chest pain, unspecified: Secondary | ICD-10-CM

## 2013-09-22 DIAGNOSIS — R002 Palpitations: Secondary | ICD-10-CM | POA: Insufficient documentation

## 2013-09-22 DIAGNOSIS — R000 Tachycardia, unspecified: Secondary | ICD-10-CM | POA: Insufficient documentation

## 2013-09-22 DIAGNOSIS — R072 Precordial pain: Secondary | ICD-10-CM

## 2013-09-22 NOTE — Progress Notes (Signed)
  Echocardiogram 2D Echocardiogram has been performed.  Renae FickleCynthia L Mathias Bogacki 09/22/2013, 2:11 PM

## 2013-09-22 NOTE — Progress Notes (Addendum)
Stress Lab Nurses Notes - Lindsey Phelps  Lindsey Phelps 09/22/2013 Reason for doing test: Chest Pain and Palpitation Type of test: Regular GTX Nurse performing test: Lindsey Poisson, RN Nuclear Medicine Tech: Not Applicable Echo Tech: Not Applicable MD performing test: Lindsey Nicolaou/K.Lawrence NP Family MD: Lindsey Phelps Test explained and consent signed: yes IV started: No IV started Symptoms: fatigue in legs & lightheaded Treatment/Intervention: None Reason test stopped: fatigue and reached target HR After recovery IV was: NA Patient to return to Nuc. Med at : NA Patient discharged: Home Patient's Condition upon discharge was: stable Comments: During test peak BP 124/79 & HR 190.  Recovery BP 111/83 & HR 103  .  Symptoms resolved in recovery.  Lindsey Phelps  Attending Note Patient exercised according to the Bruce protocol for 7 min 39 sec achieving 10.10 METs. Heart rate increased from 102 bpm up to 190 bpm (102% of THR). Blood pressure increased from 109/87 up to 124/79. The test was stopped due to fatigue, the patient did not experience any chest pain.  Baseline EKG showed NSR. There were no ischemic EKG changes or significant arrhythmias.  Conclusions 1. Negative exercise stress test for ischemia 2. No exercised induced arrhythmias 3. Duke treadmill score of 7 consistent with low risk for major cardiac events. 4. Good exercise functional capacity (100% of predicted based on age and gender)   Lindsey Rich MD

## 2013-09-23 DIAGNOSIS — R002 Palpitations: Secondary | ICD-10-CM

## 2013-09-27 ENCOUNTER — Encounter: Payer: Self-pay | Admitting: *Deleted

## 2013-09-30 ENCOUNTER — Telehealth: Payer: Self-pay | Admitting: Cardiovascular Disease

## 2013-09-30 NOTE — Telephone Encounter (Signed)
Returned call in reference to monitor results.

## 2013-10-07 ENCOUNTER — Telehealth: Payer: Self-pay | Admitting: Cardiovascular Disease

## 2013-10-07 NOTE — Telephone Encounter (Signed)
Spoke with patient.  Stated that she now has a sinus infection since she has not been able to take her Zyrtec-D.  Stated PMD wants to prescribe Prednisone, but will not do so without our permission first.    Notified patient & Dr. Phillips Odor office okay per Dr. Purvis Sheffield to give Prednisone.

## 2013-10-07 NOTE — Telephone Encounter (Signed)
Called requesting permission to take prednisone due to her wearing a heart attack

## 2013-10-07 NOTE — Telephone Encounter (Signed)
Previously discussed with patient.  Patient still wearing monitor till 10/22/2013.

## 2013-10-28 ENCOUNTER — Ambulatory Visit (INDEPENDENT_AMBULATORY_CARE_PROVIDER_SITE_OTHER): Payer: BC Managed Care – PPO | Admitting: Cardiovascular Disease

## 2013-10-28 ENCOUNTER — Encounter: Payer: Self-pay | Admitting: Cardiovascular Disease

## 2013-10-28 VITALS — BP 125/85 | HR 87 | Ht 66.0 in | Wt 189.0 lb

## 2013-10-28 DIAGNOSIS — R002 Palpitations: Secondary | ICD-10-CM

## 2013-10-28 DIAGNOSIS — R000 Tachycardia, unspecified: Secondary | ICD-10-CM

## 2013-10-28 DIAGNOSIS — Z713 Dietary counseling and surveillance: Secondary | ICD-10-CM

## 2013-10-28 DIAGNOSIS — Z7182 Exercise counseling: Secondary | ICD-10-CM

## 2013-10-28 MED ORDER — METOPROLOL SUCCINATE ER 25 MG PO TB24: 25.0000 mg | ORAL_TABLET | Freq: Every day | ORAL | Status: DC

## 2013-10-28 NOTE — Progress Notes (Signed)
Patient ID: Lindsey Phelps, female   DOB: [birth year], 33 y.o.   MRN: [id number]      SUBJECTIVE: The patient is here to followup on the results of cardiovascular testing performed for the evaluation of palpitations and chest pain. Event monitoring demonstrated sinus rhythm and sinus tachycardia. Symptoms of chest pain correlated with sinus rhythm, wall symptoms of dizziness and rapid heartbeat correlated with sinus tachycardia. Her exercise treadmill stress test was normal with a Duke treadmill score of 7. Echocardiogram demonstrated normal left ventricular systolic function. She denies chest pain but continues to have palpitations. During one of her episodes of tachycardia, she was sitting down and her heart rate was 164 beats per minute. She quit smoking over one year ago. Her father died of lung cancer and she has committed never to smoke again. She has cut out sweetened soft drinks. She drinks much more water now. She has gained weight since quitting smoking and is trying to make lifestyle changes including walking, but hasn't begun walking yet.    Allergies  Allergen Reactions  . Adhesive [Tape]     Including bandaids  . Darvocet [Propoxyphene N-Acetaminophen]   . Demerol     Current Outpatient Prescriptions  Medication Sig Dispense Refill  . cetirizine (ZYRTEC) 10 MG tablet Take 10 mg by mouth as needed for allergies.      . nitrofurantoin (MACRODANTIN) 50 MG capsule Take 50 mg by mouth 4 (four) times daily.       No current facility-administered medications for this visit.    Past Medical History  Diagnosis Date  . History of IBS   . Heart palpitations   . Anxiety     Past Surgical History  Procedure Laterality Date  . Abdominal hysterectomy    . Cholecystectomy    . Bladder neck suspension    . Laparoscopic tubal ligation    . Colonoscopy  01/23/2011    Procedure: COLONOSCOPY;  Surgeon: Malissa HippoNajeeb U Rehman, MD;  Location: [facility];  Service: Endoscopy;  Laterality:  N/A;    History   Social History  . Marital Status: Married    Spouse Name: N/A    Number of Children: N/A  . Years of Education: N/A   Occupational History  . Not on file.   Social History Main Topics  . Smoking status: Former Smoker -- 0.50 packs/day for 15 years    Types: Cigarettes    Quit date: 09/16/2012  . Smokeless tobacco: Not on file  . Alcohol Use: 0.6 oz/week    1 Shots of liquor per week     Comment: every few months  . Drug Use: No  . Sexual Activity: Not on file   Other Topics Concern  . Not on file   Social History Narrative  . No narrative on file     Filed Vitals:   10/28/13 1058  BP: 125/85  Pulse: 87  Height: 5\' 6"  (1.676 m)  Weight: 189 lb (85.73 kg)    PHYSICAL EXAM General: NAD Neck: No JVD, no thyromegaly. Lungs: Clear to auscultation bilaterally with normal respiratory effort. CV: Nondisplaced PMI.  Regular rate and rhythm, normal S1/S2, no S3/S4, no murmur. No pretibial or periankle edema.  No carotid bruit.  Normal pedal pulses.  Abdomen: Soft, nontender, no hepatosplenomegaly, no distention.  Neurologic: Alert and oriented x 3.  Psych: Normal affect. Extremities: No clubbing or cyanosis.   ECG: reviewed and available in electronic records.      ASSESSMENT AND PLAN: 1.  Tachycardia and palpitations: I will start Toprol XL 25 mg daily, for what appears to be an inappropriate sinus tachycardia. I have advised her to continue to avoid caffeinated beverages in excess.  2. Chest pain: Given normal exercise treadmill stress test, no further testing is indicated, as her symptoms were atypical. 3. Lifestyle risk factor modification: I gave her extensive counseling on diet and exercise.  Dispo: f/u 1 month.     Prentice Docker, M.D., F.A.C.C.

## 2013-10-28 NOTE — Patient Instructions (Signed)
   Begin Toprol XL 25mg  daily - new sent to pharm Continue all other medications.   Follow up in  1 month

## 2013-11-21 ENCOUNTER — Telehealth: Payer: Self-pay | Admitting: Cardiovascular Disease

## 2013-11-21 NOTE — Telephone Encounter (Signed)
OV scheduled for 11/25/13 with Dr. Purvis SheffieldKoneswaran.    Feet, ankles, from knee down - swelling.  X 2 weeks, no weight gain, no SOB, no dizziness.  Thought this was due to the walking, but has not made any difference since stopping.  Has continued to take the Metoprolol for now.

## 2013-11-21 NOTE — Telephone Encounter (Signed)
I will need to examine her. Doubt Toprol-XL is causing this but I will evaluate at ov on 6/26.

## 2013-11-21 NOTE — Telephone Encounter (Signed)
Lindsey Phelps states that she has been walking for the past 2 weeks. Having pain in her legs,ankles and feet. States that she Is having a lot of swelling. Is wondering if the Toprol XL 25mg  daily could be a factor with this. # [id number].

## 2013-11-22 NOTE — Telephone Encounter (Signed)
Patient notified

## 2013-11-25 ENCOUNTER — Encounter: Payer: Self-pay | Admitting: Cardiovascular Disease

## 2013-11-25 ENCOUNTER — Ambulatory Visit (INDEPENDENT_AMBULATORY_CARE_PROVIDER_SITE_OTHER): Payer: BC Managed Care – PPO | Admitting: Cardiovascular Disease

## 2013-11-25 VITALS — BP 113/74 | HR 83 | Ht 66.0 in | Wt 190.0 lb

## 2013-11-25 DIAGNOSIS — R002 Palpitations: Secondary | ICD-10-CM

## 2013-11-25 DIAGNOSIS — R000 Tachycardia, unspecified: Secondary | ICD-10-CM

## 2013-11-25 DIAGNOSIS — M7989 Other specified soft tissue disorders: Secondary | ICD-10-CM

## 2013-11-25 DIAGNOSIS — M25476 Effusion, unspecified foot: Secondary | ICD-10-CM

## 2013-11-25 DIAGNOSIS — M25473 Effusion, unspecified ankle: Secondary | ICD-10-CM

## 2013-11-25 MED ORDER — POTASSIUM CHLORIDE ER 10 MEQ PO TBCR: 10.0000 meq | EXTENDED_RELEASE_TABLET | ORAL | Status: DC | PRN

## 2013-11-25 MED ORDER — FUROSEMIDE 20 MG PO TABS
20.0000 mg | ORAL_TABLET | ORAL | Status: DC | PRN
Start: 2013-11-25 — End: 2015-02-13

## 2013-11-25 NOTE — Progress Notes (Signed)
Patient ID: Lindsey Phelps, female   DOB: [birth year], 33 y.o.   MRN: [id number]      SUBJECTIVE: The patient is here to followup for tachycardia and palpitations. She had previously been experiencing chest pain and had a normal stress test. She has normal left ventricular systolic function. I previously started her on Toprol-XL. She has recently been complaining of pain and swelling in the legs, ankles, and feet. She is very happy with the Toprol-XL, as it has entirely alleviated her tachycardia and palpitations. She denies chest pain as well. She said her ankles are not swollen in the mornings, but notices significant bilateral ankle swelling as well as dorsal foot swelling by the end of the day.   Allergies  Allergen Reactions  . Adhesive [Tape]     Including bandaids  . Darvocet [Propoxyphene N-Acetaminophen]   . Demerol     Current Outpatient Prescriptions  Medication Sig Dispense Refill  . cetirizine (ZYRTEC) 10 MG tablet Take 10 mg by mouth as needed for allergies.      . metoprolol succinate (TOPROL XL) 25 MG 24 hr tablet Take 1 tablet (25 mg total) by mouth daily.  30 tablet  6  . nitrofurantoin (MACRODANTIN) 50 MG capsule Take 50 mg by mouth 4 (four) times daily as needed.        No current facility-administered medications for this visit.    Past Medical History  Diagnosis Date  . History of IBS   . Heart palpitations   . Anxiety     Past Surgical History  Procedure Laterality Date  . Abdominal hysterectomy    . Cholecystectomy    . Bladder neck suspension    . Laparoscopic tubal ligation    . Colonoscopy  01/23/2011    Procedure: COLONOSCOPY;  Surgeon: Malissa Hippo, MD;  Location: [facility];  Service: Endoscopy;  Laterality: N/A;    History   Social History  . Marital Status: Married    Spouse Name: N/A    Number of Children: N/A  . Years of Education: N/A   Occupational History  . Not on file.   Social History Main Topics  . Smoking status:  Former Smoker -- 0.50 packs/day for 15 years    Types: Cigarettes    Quit date: 09/16/2012  . Smokeless tobacco: Never Used  . Alcohol Use: 0.6 oz/week    1 Shots of liquor per week     Comment: every few months  . Drug Use: No  . Sexual Activity: Not on file   Other Topics Concern  . Not on file   Social History Narrative  . No narrative on file     Filed Vitals:   11/25/13 0846  BP: 113/74  Pulse: 83  Height: 5\' 6"  (1.676 m)  Weight: 190 lb (86.183 kg)  SpO2: 99%    PHYSICAL EXAM General: NAD Neck: No JVD, no thyromegaly. Lungs: Clear to auscultation bilaterally with normal respiratory effort. CV: Nondisplaced PMI.  Regular rate and rhythm, normal S1/S2, no S3/S4, no murmur. No pretibial or periankle edema.  No carotid bruit.  Normal pedal pulses.  Abdomen: Soft, nontender, no hepatosplenomegaly, no distention.  Neurologic: Alert and oriented x 3.  Psych: Normal affect. Extremities: No clubbing or cyanosis.   ECG: reviewed and available in electronic records.      ASSESSMENT AND PLAN: 1. Tachycardia and palpitations: I will continueToprol XL 25 mg daily, for what appears to be an inappropriate sinus tachycardia. I have advised her  to continue to avoid caffeinated beverages in excess.  2. Chest pain: Given normal exercise treadmill stress test, no further testing is indicated, as her symptoms were atypical.  3. Lifestyle risk factor modification: I gave her extensive counseling on diet and exercise.  4. Leg/ankle/feet swelling: She has bilateral venous for cost abuse. I will prescribe Lasix 20 mg as needed for ankle and feet swelling, with concomitant use of 10 mEq potassium chloride.  Dispo: f/u 6 weeks.  Prentice DockerSuresh Koneswaran, M.D., F.A.C.C.

## 2013-11-25 NOTE — Patient Instructions (Signed)
   Lasix 20mg  as needed for ankle/feet swelling   Potassium 10meq as needed on days that you take your Lasix Continue all other current medications. Follow up in  6 weeks

## 2013-12-30 ENCOUNTER — Ambulatory Visit (INDEPENDENT_AMBULATORY_CARE_PROVIDER_SITE_OTHER): Payer: BC Managed Care – PPO | Admitting: Cardiovascular Disease

## 2013-12-30 ENCOUNTER — Encounter: Payer: Self-pay | Admitting: Cardiovascular Disease

## 2013-12-30 VITALS — BP 109/74 | HR 72 | Ht 66.0 in | Wt 192.0 lb

## 2013-12-30 DIAGNOSIS — R002 Palpitations: Secondary | ICD-10-CM

## 2013-12-30 DIAGNOSIS — Z7182 Exercise counseling: Secondary | ICD-10-CM

## 2013-12-30 DIAGNOSIS — M7989 Other specified soft tissue disorders: Secondary | ICD-10-CM

## 2013-12-30 DIAGNOSIS — R079 Chest pain, unspecified: Secondary | ICD-10-CM

## 2013-12-30 DIAGNOSIS — R000 Tachycardia, unspecified: Secondary | ICD-10-CM

## 2013-12-30 MED ORDER — POTASSIUM CHLORIDE ER 10 MEQ PO TBCR: 20.0000 meq | EXTENDED_RELEASE_TABLET | ORAL | Status: DC | PRN

## 2013-12-30 NOTE — Progress Notes (Signed)
Patient ID: Lindsey Phelps, female   DOB: 1980-09-27, 33 y.o.   MRN: [id number]      SUBJECTIVE: The patient is here to followup for tachycardia and palpitations. She had previously been experiencing chest pain and had a normal stress test. She has normal left ventricular systolic function. I previously started her on Toprol-XL. This has completely alleviated her symptoms and she is very happy with this. However, she has been complaining of pain and swelling in the legs, ankles, and feet.   She said her ankles are not swollen in the mornings, but notices significant bilateral ankle swelling as well as dorsal foot swelling by the end of the day. She has taken 20 mg of Lasix for this and while this alleviated the swelling, she had significant leg and feet cramping. She initially tried 10 mEq of potassium but this did not alleviate it, and then tried 20 mEq and this has helped. She has done very little walking and physical activity but has modified her diet considerably. She is very interested in losing weight. She has cut out chocolate and mayonnaise and has reduced her sweet tea consumption.    Review of Systems: As per "subjective", otherwise negative.  Allergies  Allergen Reactions  . Adhesive [Tape]     Including bandaids  . Darvocet [Propoxyphene N-Acetaminophen]   . Demerol     Current Outpatient Prescriptions  Medication Sig Dispense Refill  . cetirizine (ZYRTEC) 10 MG tablet Take 10 mg by mouth as needed for allergies.      . furosemide (LASIX) 20 MG tablet Take 1 tablet (20 mg total) by mouth as needed for edema (ankle/feet swelling).  30 tablet  2  . metoprolol succinate (TOPROL XL) 25 MG 24 hr tablet Take 1 tablet (25 mg total) by mouth daily.  30 tablet  6  . nitrofurantoin (MACRODANTIN) 50 MG capsule Take 50 mg by mouth 4 (four) times daily as needed.       . potassium chloride (K-DUR) 10 MEQ tablet Take 1 tablet (10 mEq total) by mouth as needed (take on days you take your  Lasix).  30 tablet  2   No current facility-administered medications for this visit.    Past Medical History  Diagnosis Date  . History of IBS   . Heart palpitations   . Anxiety     Past Surgical History  Procedure Laterality Date  . Abdominal hysterectomy    . Cholecystectomy    . Bladder neck suspension    . Laparoscopic tubal ligation    . Colonoscopy  01/23/2011    Procedure: COLONOSCOPY;  Surgeon: Malissa HippoNajeeb U Rehman, MD;  Location: [facility];  Service: Endoscopy;  Laterality: N/A;    History   Social History  . Marital Status: Married    Spouse Name: N/A    Number of Children: N/A  . Years of Education: N/A   Occupational History  . Not on file.   Social History Main Topics  . Smoking status: Former Smoker -- 0.50 packs/day for 15 years    Types: Cigarettes    Quit date: 09/16/2012  . Smokeless tobacco: Never Used  . Alcohol Use: 0.6 oz/week    1 Shots of liquor per week     Comment: every few months  . Drug Use: No  . Sexual Activity: Not on file   Other Topics Concern  . Not on file   Social History Narrative  . No narrative on file     [facility]  [facility]itals:   12/30/13 1116  BP: 109/74  Pulse: 72  Height: 5\' 6"  (1.676 m)  Weight: 192 lb (87.091 kg)    PHYSICAL EXAM General: NAD Neck: No JVD, no thyromegaly. Lungs: Clear to auscultation bilaterally with normal respiratory effort. CV: Nondisplaced PMI.  Regular rate and rhythm, normal S1/S2, no S3/S4, no murmur. No pretibial or periankle edema.  No carotid bruit.  Normal pedal pulses.  Abdomen: Soft, nontender, no hepatosplenomegaly, no distention.  Neurologic: Alert and oriented x 3.  Psych: Normal affect. Extremities: No clubbing or cyanosis.   ECG: reviewed and available in electronic records.      ASSESSMENT AND PLAN: 1. Tachycardia and palpitations: I will continueToprol XL 25 mg daily, for what appears to be an inappropriate sinus tachycardia. I have advised her to continue to avoid  caffeinated beverages in excess. I offered switching her to diltiazem but she would prefer to stay on the metoprolol.  2. Chest pain: No further recurrences. Given normal exercise treadmill stress test, no further testing is indicated, as her symptoms were atypical.   3. Lifestyle risk factor modification: I again gave her extensive counseling on diet and exercise. I encouraged her to try swimming as well as using an elliptical.  4. Leg/ankle/feet swelling: She has bilateral venous varicosities. I will continue Lasix 20 mg as needed for ankle and feet swelling, with concomitant use of 20 mEq potassium chloride. I also encouraged her to drink electrolyte-containing beverages  Dispo: f/u 3 months.   Prentice Docker, M.D., F.A.C.C.

## 2013-12-30 NOTE — Patient Instructions (Signed)
   Increase Potassium to on days you take your Lasix Continue all other medications.   Follow up in  3 months

## 2014-03-23 ENCOUNTER — Telehealth: Payer: Self-pay | Admitting: *Deleted

## 2014-03-23 NOTE — Telephone Encounter (Signed)
Patient called with c/o chest pain/pressure.  Yesterday at work had cramp, sharp pain in left arm down to elbow.  Was awoken at 4:30 this morning with chest pain in center of chest, stabbing/pressure feeling.  Is currently at work & still has some pain (3 1/2 - 4/10).  Has eased some, but does feel dull, but constant.  Does c/o some SOB with pain.  No dizziness.  She questions if this could be indigestion.  Also, states that her heart rate feels normal.    Discussed above with Dr. Purvis SheffieldKoneswaran - offer patient appointment to come into office for evaluation this afternoon.    Patient notified of above.  States that she will take antacid & see how she feels.  States she has hot feeling in back of throat & feels bubbly in her chest.  Offered for her to be seen this afternoon in office multiple times, but she declined.  If gets worse, stated that she would go to ED.  Again offered OV & told her to call office back if she changed her mind.  Patient verbalized understanding.

## 2014-03-31 ENCOUNTER — Ambulatory Visit: Payer: BC Managed Care – PPO | Admitting: Cardiovascular Disease

## 2014-04-14 ENCOUNTER — Ambulatory Visit: Payer: BC Managed Care – PPO | Admitting: Cardiovascular Disease

## 2014-10-12 ENCOUNTER — Other Ambulatory Visit (HOSPITAL_COMMUNITY): Payer: Self-pay | Admitting: Internal Medicine

## 2014-10-12 ENCOUNTER — Ambulatory Visit (HOSPITAL_COMMUNITY)
Admission: RE | Admit: 2014-10-12 | Discharge: 2014-10-12 | Disposition: A | Discharge status: Home / Self Care | Payer: BLUE CROSS/BLUE SHIELD | Source: Ambulatory Visit | Attending: Internal Medicine | Admitting: Internal Medicine

## 2014-10-12 DIAGNOSIS — M79672 Pain in left foot: Secondary | ICD-10-CM

## 2015-01-22 ENCOUNTER — Ambulatory Visit: Payer: BLUE CROSS/BLUE SHIELD | Admitting: Podiatry

## 2015-01-30 ENCOUNTER — Encounter: Payer: BLUE CROSS/BLUE SHIELD | Admitting: Podiatry

## 2015-01-30 ENCOUNTER — Ambulatory Visit: Payer: Self-pay

## 2015-01-31 NOTE — Progress Notes (Signed)
This encounter was created in error - please disregard.

## 2015-02-13 ENCOUNTER — Ambulatory Visit (INDEPENDENT_AMBULATORY_CARE_PROVIDER_SITE_OTHER): Payer: BLUE CROSS/BLUE SHIELD | Admitting: Podiatry

## 2015-02-13 ENCOUNTER — Ambulatory Visit (INDEPENDENT_AMBULATORY_CARE_PROVIDER_SITE_OTHER): Payer: BLUE CROSS/BLUE SHIELD

## 2015-02-13 ENCOUNTER — Ambulatory Visit: Payer: BLUE CROSS/BLUE SHIELD

## 2015-02-13 ENCOUNTER — Encounter: Payer: Self-pay | Admitting: Podiatry

## 2015-02-13 VITALS — BP 118/77 | HR 73 | Resp 16 | Ht 66.0 in | Wt 200.0 lb

## 2015-02-13 DIAGNOSIS — M79672 Pain in left foot: Secondary | ICD-10-CM | POA: Diagnosis not present

## 2015-02-13 DIAGNOSIS — M79671 Pain in right foot: Secondary | ICD-10-CM | POA: Diagnosis not present

## 2015-02-13 DIAGNOSIS — M722 Plantar fascial fibromatosis: Secondary | ICD-10-CM

## 2015-02-13 MED ORDER — MELOXICAM 15 MG PO TABS: 15.0000 mg | ORAL_TABLET | Freq: Every day | ORAL | Status: DC

## 2015-02-13 MED ORDER — METHYLPREDNISOLONE 4 MG PO TBPK: ORAL_TABLET | ORAL | Status: DC

## 2015-02-13 NOTE — Progress Notes (Signed)
   Subjective:    Patient ID: Donnie Aho, female    DOB: 1980/08/20, 34 y.o.   MRN: [id number]  HPI: She presents today with a five-month duration of pain to her heels and the lateral aspect of her foot she states that she feels that she has broken bones along the fifth metatarsals bilaterally. She did not continue trying to treat the symptoms. She states this becoming worse as time goes along and is becoming more debilitating.:    Review of Systems  Musculoskeletal: Positive for arthralgias.  All other systems reviewed and are negative.      Objective:   Physical Exam: 34 year old white female in no apparent distress vital signs stable alert and oriented 3 pulses are strongly palpable. Neurologic sensorium is intact bilateral deep tendon reflexes are intact bilateral muscle strength is 5 over 5 dorsiflexion flexors and inverters everters all intrinsic musculature is intact. Orthopedic evaluation of his total joints distal to the ankle for range of motion without crepitus. She has pain on palpation fourth and fifth metatarsals of the bilateral foot she also has moderate tenderness on palpation of the plantar fascia bilateral. Radiographs 3 views taken today in the office demonstrated only soft tissue increase in density at the plantar fascial calcaneal insertion sites no fractures are identified. Cutaneous evaluation demonstrates supple well-hydrated cutis no erythema edema cellulitis drainage odors open lesions or wounds.        Assessment & Plan:  Assessment: Plantar fasciitis with more prominent lateral compensatory syndrome.  Plan: At this point she was scanned for a new set of orthotics started on methylprednisolone to be followed by meloxicam.

## 2015-03-16 ENCOUNTER — Ambulatory Visit: Payer: BLUE CROSS/BLUE SHIELD | Admitting: *Deleted

## 2015-03-16 DIAGNOSIS — M722 Plantar fascial fibromatosis: Secondary | ICD-10-CM

## 2015-03-16 NOTE — Patient Instructions (Signed)

## 2015-03-16 NOTE — Progress Notes (Signed)
Patient ID: Lindsey Phelps, female   DOB: Jan 22, 1981, 34 y.o.   MRN: [id number] Patient presents for orthotic pick up.  Verbal and written break in and wear instructions given.  Patient will follow up in 4 weeks if symptoms worsen or fail to improve.

## 2015-04-17 ENCOUNTER — Ambulatory Visit: Payer: BLUE CROSS/BLUE SHIELD | Admitting: Podiatry

## 2015-10-23 DIAGNOSIS — M25532 Pain in left wrist: Secondary | ICD-10-CM | POA: Diagnosis not present

## 2015-11-19 DIAGNOSIS — M25532 Pain in left wrist: Secondary | ICD-10-CM | POA: Diagnosis not present

## 2015-11-19 DIAGNOSIS — R202 Paresthesia of skin: Secondary | ICD-10-CM | POA: Diagnosis not present

## 2015-11-19 DIAGNOSIS — R200 Anesthesia of skin: Secondary | ICD-10-CM | POA: Diagnosis not present

## 2015-11-19 DIAGNOSIS — M7722 Periarthritis, left wrist: Secondary | ICD-10-CM | POA: Diagnosis not present

## 2015-11-26 DIAGNOSIS — M25532 Pain in left wrist: Secondary | ICD-10-CM | POA: Diagnosis not present

## 2015-11-26 DIAGNOSIS — R202 Paresthesia of skin: Secondary | ICD-10-CM | POA: Diagnosis not present

## 2015-11-26 DIAGNOSIS — R200 Anesthesia of skin: Secondary | ICD-10-CM | POA: Diagnosis not present

## 2016-01-08 ENCOUNTER — Encounter: Payer: Self-pay | Admitting: *Deleted

## 2016-01-09 ENCOUNTER — Ambulatory Visit (INDEPENDENT_AMBULATORY_CARE_PROVIDER_SITE_OTHER): Payer: BLUE CROSS/BLUE SHIELD | Admitting: Cardiovascular Disease

## 2016-01-09 ENCOUNTER — Encounter: Payer: Self-pay | Admitting: Cardiovascular Disease

## 2016-01-09 VITALS — BP 124/90 | HR 79 | Ht 66.0 in | Wt 209.0 lb

## 2016-01-09 DIAGNOSIS — I10 Essential (primary) hypertension: Secondary | ICD-10-CM

## 2016-01-09 DIAGNOSIS — R002 Palpitations: Secondary | ICD-10-CM | POA: Diagnosis not present

## 2016-01-09 DIAGNOSIS — R635 Abnormal weight gain: Secondary | ICD-10-CM

## 2016-01-09 DIAGNOSIS — R000 Tachycardia, unspecified: Secondary | ICD-10-CM | POA: Diagnosis not present

## 2016-01-09 MED ORDER — DILTIAZEM HCL 30 MG PO TABS: 30.0000 mg | ORAL_TABLET | ORAL | 6 refills | Status: DC

## 2016-01-09 NOTE — Progress Notes (Signed)
SUBJECTIVE: The patient is here to followup for tachycardia and palpitations. I have not seen her since 2015. She is no longer on metoprolol. She has had weight gain and fatigue. She has a watch which monitors her heart rate. She went walking with her husband the other day and her heart rate was 170 bpm. After 15 minutes it slowed to 145 bpm. Her blood pressure has been elevated and as high as 150/98.  Her mother has thyroid disease.  ECG performed in the office today which I personally reviewed demonstrates normal sinus rhythm with no ischemic ST segment or T-wave abnormalities, nor any arrhythmias.  Soc: Her brother Mitchell Heir is also my patient.    Review of Systems: As per "subjective", otherwise negative.  Allergies  Allergen Reactions  . Adhesive [Tape]     Including bandaids  . Darvocet [Propoxyphene N-Acetaminophen]   . Demerol     Current Outpatient Prescriptions  Medication Sig Dispense Refill  . cetirizine (ZYRTEC) 10 MG tablet Take 10 mg by mouth as needed for allergies.    . diazepam (VALIUM) 5 MG tablet Take 5 mg by mouth every 6 (six) hours as needed for anxiety.    . nitrofurantoin (MACRODANTIN) 50 MG capsule Take 50 mg by mouth 4 (four) times daily as needed.      No current facility-administered medications for this visit.     Past Medical History:  Diagnosis Date  . Anxiety   . Heart palpitations   . History of IBS     Past Surgical History:  Procedure Laterality Date  . ABDOMINAL HYSTERECTOMY    . BLADDER NECK SUSPENSION    . CHOLECYSTECTOMY    . COLONOSCOPY  01/23/2011   Procedure: COLONOSCOPY;  Surgeon: Malissa Hippo, MD;  Location: [facility];  Service: Endoscopy;  Laterality: N/A;  . LAPAROSCOPIC TUBAL LIGATION      Social History   Social History  . Marital status: Married    Spouse name: N/A  . Number of children: N/A  . Years of education: N/A   Occupational History  . Not on file.   Social History Main Topics  .  Smoking status: Former Smoker    Packs/day: 0.50    Years: 15.00    Types: Cigarettes    Quit date: 09/16/2012  . Smokeless tobacco: Never Used  . Alcohol use 0.6 oz/week    1 Shots of liquor per week     Comment: every few months  . Drug use: No  . Sexual activity: Not on file   Other Topics Concern  . Not on file   Social History Narrative  . No narrative on file     Vitals:   01/09/16 1302  BP: 124/90  Pulse: 79  SpO2: 99%  Weight: 209 lb (94.8 kg)  Height: 5\' 6"  (1.676 m)    PHYSICAL EXAM General: NAD HEENT: Normal. Neck: No JVD, no thyromegaly. Lungs: Clear to auscultation bilaterally with normal respiratory effort. CV: Nondisplaced PMI.  Regular rate and rhythm, normal S1/S2, no S3/S4, no murmur. No pretibial or periankle edema.  No carotid bruit.   Abdomen: Soft, nontender, no distention.  Neurologic: Alert and oriented.  Psych: Normal affect. Skin: Normal. Musculoskeletal: No gross deformities.    ECG: Most recent ECG reviewed.      ASSESSMENT AND PLAN: 1. Tachycardia and palpitations: I will start diltiazem 30 mg q am for what appears to be an inappropriate sinus tachycardia.   2. Essential  HTN: Starting diltiazem 30 mg q am for palps/tachycardia. Encouraged weight loss.  3. Weight gain: Will check TSH and free T4.  Dispo: fu 3 months.   Prentice Docker, M.D., F.A.C.C.

## 2016-01-09 NOTE — Patient Instructions (Signed)
Medication Instructions:   Begin Diltiazem (short acting) 30mg  every morning.  Continue all other medications.    Labwork:  TSH, free T4 - orders given today.   Office will contact with results via phone or letter.    Testing/Procedures: none  Follow-Up: 3 months  Any Other Special Instructions Will Be Listed Below (If Applicable).  If you need a refill on your cardiac medications before your next appointment, please call your pharmacy.

## 2016-01-15 ENCOUNTER — Encounter: Payer: Self-pay | Admitting: *Deleted

## 2016-01-22 ENCOUNTER — Telehealth: Payer: Self-pay | Admitting: Cardiovascular Disease

## 2016-01-22 NOTE — Telephone Encounter (Signed)
Pt describes SOB as "feels like I need a deeper breath" and pressure on chest when coughing. Coughing has been mainly in the evening. Says she takes Cardizem and phentermine in morning. Concerned that cough is side effect of Cardizem. Will forward to Dr. Wyline Mood Dr. Purvis Sheffield out of office. HR has been remaining in the 90s, BP running 130s/90s.

## 2016-01-22 NOTE — Telephone Encounter (Signed)
Mrs. Inwood called stating that she started Phentermine 37.5 mg from Dr. Sherwood Gambler on 01-11-16.  States that she also started taking the Cardizem 30mg  on 01-10-16. From 08-11 thru 08-20 everything seemed fine. She started coughing 2 days ago, having a dry mouth,she states that she is really not having shortness of breath but at times she feels like she can't get air.  She said that she sometimes feels like a weight is on her chest.  Please call her cell #.

## 2016-01-22 NOTE — Telephone Encounter (Signed)
Symptoms are not common side effects of cardizem, she can try not taking the cardizem for the rest of the week and then update us on Friday about her symptoms.

## 2016-01-22 NOTE — Telephone Encounter (Signed)
Pt aware and will hold cardizem until Friday and call us back

## 2016-01-25 MED ORDER — METOPROLOL TARTRATE 25 MG PO TABS: 25.0000 mg | ORAL_TABLET | Freq: Two times a day (BID) | ORAL | 6 refills | Status: DC

## 2016-01-25 NOTE — Telephone Encounter (Signed)
I see that she is on phentermine. This can cause palpitations, tachycardia and chest pressure as well. As she is feeling better off of diltiazem we can try low dose metoprolol 25 mg BID. She should not be on phentermine long term, as this is an amphetamine and to be used for only a very short time.

## 2016-01-25 NOTE — Telephone Encounter (Addendum)
Patient notified and verbalized understanding.  New medication sent to [facility] today.

## 2016-01-25 NOTE — Telephone Encounter (Signed)
Pt says side effects have resolved. Wanting to know what other medication she can take other than diltiazem. Will forward to Joni ReiningKathryn Lawrence, NP Dr. Purvis SheffieldKoneswaran out of office as well as Dr. Wyline MoodBranch

## 2016-01-25 NOTE — Addendum Note (Signed)
Addended by: Lesle Chris on: 01/25/2016 05:11 PM   Modules accepted: Orders

## 2016-01-26 ENCOUNTER — Emergency Department (HOSPITAL_COMMUNITY): Payer: BLUE CROSS/BLUE SHIELD

## 2016-01-26 ENCOUNTER — Encounter (HOSPITAL_COMMUNITY): Payer: Self-pay | Admitting: *Deleted

## 2016-01-26 ENCOUNTER — Emergency Department (HOSPITAL_COMMUNITY)
Admission: EM | Admit: 2016-01-26 | Discharge: 2016-01-26 | Disposition: A | Discharge status: Home / Self Care | Payer: BLUE CROSS/BLUE SHIELD | Attending: Emergency Medicine | Admitting: Emergency Medicine

## 2016-01-26 DIAGNOSIS — R079 Chest pain, unspecified: Secondary | ICD-10-CM | POA: Diagnosis present

## 2016-01-26 DIAGNOSIS — R0602 Shortness of breath: Secondary | ICD-10-CM | POA: Diagnosis not present

## 2016-01-26 DIAGNOSIS — R202 Paresthesia of skin: Secondary | ICD-10-CM | POA: Diagnosis not present

## 2016-01-26 DIAGNOSIS — Z87891 Personal history of nicotine dependence: Secondary | ICD-10-CM | POA: Diagnosis not present

## 2016-01-26 DIAGNOSIS — R0789 Other chest pain: Secondary | ICD-10-CM | POA: Diagnosis not present

## 2016-01-26 LAB — CBC WITH DIFFERENTIAL/PLATELET
BASOS ABS: 0 10*3/uL (ref 0.0–0.1)
BASOS PCT: 0 %
EOS ABS: 0.2 10*3/uL (ref 0.0–0.7)
EOS PCT: 4 %
HCT: 41.2 % (ref 36.0–46.0)
Hemoglobin: 14.2 g/dL (ref 12.0–15.0)
Lymphocytes Relative: 45 %
Lymphs Abs: 3.1 10*3/uL (ref 0.7–4.0)
MCH: 30.2 pg (ref 26.0–34.0)
MCHC: 34.5 g/dL (ref 30.0–36.0)
MCV: 87.7 fL (ref 78.0–100.0)
MONO ABS: 0.6 10*3/uL (ref 0.1–1.0)
Monocytes Relative: 8 %
NEUTROS ABS: 2.9 10*3/uL (ref 1.7–7.7)
Neutrophils Relative %: 43 %
PLATELETS: 262 10*3/uL (ref 150–400)
RBC: 4.7 MIL/uL (ref 3.87–5.11)
RDW: 12.1 % (ref 11.5–15.5)
WBC: 6.8 10*3/uL (ref 4.0–10.5)

## 2016-01-26 LAB — BASIC METABOLIC PANEL
ANION GAP: 7 (ref 5–15)
BUN: 8 mg/dL (ref 6–20)
CALCIUM: 9.6 mg/dL (ref 8.9–10.3)
CO2: 21 mmol/L — ABNORMAL LOW (ref 22–32)
Chloride: 109 mmol/L (ref 101–111)
Creatinine, Ser: 0.92 mg/dL (ref 0.44–1.00)
GFR calc Af Amer: >60 mL/min (ref >60)
GLUCOSE: 80 mg/dL (ref 65–99)
Potassium: 3.3 mmol/L — ABNORMAL LOW (ref 3.5–5.1)
Sodium: 137 mmol/L (ref 135–145)

## 2016-01-26 LAB — I-STAT TROPONIN, ED
TROPONIN I, POC: 0 ng/mL (ref 0.00–0.08)
TROPONIN I, POC: 0 ng/mL (ref 0.00–0.08)

## 2016-01-26 LAB — I-STAT BETA HCG BLOOD, ED (MC, WL, AP ONLY): I-stat hCG, quantitative: <5 m[IU]/mL (ref <5)

## 2016-01-26 MED ORDER — LORAZEPAM 1 MG PO TABS: 1.0000 mg | ORAL_TABLET | Freq: Once | ORAL | Status: DC

## 2016-01-26 MED ORDER — ONDANSETRON HCL 4 MG/2ML IJ SOLN
4.0000 mg | Freq: Once | INTRAMUSCULAR | Status: AC
  Administered 2016-01-26: 4 mg via INTRAVENOUS
  Filled 2016-01-26: qty 2

## 2016-01-26 MED ORDER — IOPAMIDOL (ISOVUE-370) INJECTION 76%
INTRAVENOUS | Status: AC
  Administered 2016-01-26: 100 mL
  Filled 2016-01-26: qty 100

## 2016-01-26 MED ORDER — HYDROMORPHONE HCL 1 MG/ML IJ SOLN
0.5000 mg | Freq: Once | INTRAMUSCULAR | Status: AC
  Administered 2016-01-26: 0.5 mg via INTRAVENOUS
  Filled 2016-01-26: qty 1

## 2016-01-26 NOTE — ED Provider Notes (Signed)
[facility]EMER[facility]ENCY DEPT Provider Note   CSN: [id number] Arrival date & time: 01/26/16  1719     History   Chief Complaint Chief Complaint  Patient presents with  . Chest Pain    HPI  Blood pressure 125/86, pulse 98, temperature 98.9 F (37.2 C), temperature source Oral, resp. rate 14, SpO2 100 %.  Lindsey Phelps is a 35 y.o. female complaining of severe acute onset of chest pain earlier in the day with associated radiation to the left jaw, left arm and associated bilateral arm tingling. No cough, shortness of breath, recent immobilization, calf pain, leg swelling. Patient recently started taking phentermine for weight loss and energy, started on the 11th, she cleared this with her cardiologist for short term use. Recently started Cardizem for palpitations and tachycardia. Extensive family history of ACS with brother having MI at 5 with 2 stents. She's never had a cath. Followed by cardiology for palpitations/tachycardia and hypertension. Patient denies cocaine, methamphetamine use. She was given full dose aspirin and nitroglycerin prior to arrival with little relief. States she has a history of anxiety panic but she hasn't been medicated in years and doesn't feel similar. No particular stressors in her life at this time. She denies numbness, weakness, syncope, abdominal pain, unilateral weakness.    Cardiology RF: Former Smoker, Brother MI 12 2x stents  Cardiology: Darl Householder PCP: Phillips Odor  HPI  Past Medical History:  Diagnosis Date  . Anxiety   . Heart palpitations   . History of IBS     Patient Active Problem List   Diagnosis Date Noted  . Leg swelling 12/30/2013  . Palpitations 09/16/2013  . Chest pain 09/16/2013    Past Surgical History:  Procedure Laterality Date  . ABDOMINAL HYSTERECTOMY    . BLADDER NECK SUSPENSION    . CHOLECYSTECTOMY    . COLONOSCOPY  01/23/2011   Procedure: COLONOSCOPY;  Surgeon: Malissa Hippo, MD;  Location: [facility];  Service:  Endoscopy;  Laterality: N/A;  . LAPAROSCOPIC TUBAL LIGATION      OB History    No data available       Home Medications    Prior to Admission medications   Medication Sig Start Date End Date Taking? Authorizing Provider  cetirizine (ZYRTEC) 10 MG tablet Take 10 mg by mouth as needed for allergies.    Historical Provider, MD  diazepam (VALIUM) 5 MG tablet Take 5 mg by mouth every 6 (six) hours as needed for anxiety.    Historical Provider, MD  metoprolol tartrate (LOPRESSOR) 25 MG tablet Take 1 tablet (25 mg total) by mouth 2 (two) times daily. 01/25/16   Jodelle Gross, NP  nitrofurantoin (MACRODANTIN) 50 MG capsule Take 50 mg by mouth 4 (four) times daily as needed.     Historical Provider, MD    Family History Family History  Problem Relation Age of Onset  . Diabetes Father   . Hypertension Father   . Colon polyps Father   . Diabetes Mother   . Hypertension Mother   . Coronary artery disease Mother   . COPD Mother     Social History Social History  Substance Use Topics  . Smoking status: Former Smoker    Packs/day: 0.50    Years: 15.00    Types: Cigarettes    Quit date: 09/16/2012  . Smokeless tobacco: Never Used  . Alcohol use 0.6 oz/week    1 Shots of liquor per week     Comment: every few months  Allergies   Adhesive [tape]; Darvocet [propoxyphene n-acetaminophen]; and Demerol   Review of Systems Review of Systems   Physical Exam Updated Vital Signs  Physical Exam  Constitutional: She is oriented to person, place, and time. She appears well-developed and well-nourished. She appears distressed.  Appears very uncomfortable screaming in pain at the inflation of the blood pressure cuff.  HENT:  Head: Normocephalic.  Mouth/Throat: Oropharynx is clear and moist.  Eyes: Conjunctivae are normal.  Neck: Normal range of motion. No JVD present. No tracheal deviation present.  Cardiovascular: Normal rate, regular rhythm and intact distal pulses.     Pulses 2+ 4 extremities, strength and sensation is intact 4  Pulmonary/Chest: Effort normal and breath sounds normal. No stridor. No respiratory distress. She has no wheezes. She has no rales. She exhibits no tenderness.  Abdominal: Soft. She exhibits no distension and no mass. There is no tenderness. There is no rebound and no guarding.  Musculoskeletal: Normal range of motion. She exhibits no edema or tenderness.  No calf asymmetry, superficial collaterals, palpable cords, edema, Homans sign negative bilaterally.    Neurological: She is alert and oriented to person, place, and time.  Skin: Skin is warm. She is not diaphoretic.  Psychiatric: She has a normal mood and affect.  Nursing note and vitals reviewed.    ED Treatments / Results  Labs (all labs ordered are listed, but only abnormal results are displayed) Labs Reviewed  BASIC METABOLIC PANEL - Abnormal; Notable for the following:       Result Value   Potassium 3.3 (*)    CO2 21 (*)    All other components within normal limits  CBC WITH DIFFERENTIAL/PLATELET  I-STAT BETA HCG BLOOD, ED ([facility], [facility], [facility] ONLY)  I-STAT TROPOININ, ED  Rosezena Sensor, ED  Rosezena Sensor, ED    EKG  EKG Interpretation  Date/Time:  Saturday January 26 2016 17:24:24 EDT Ventricular Rate:  97 PR Interval:    QRS Duration: 96 QT Interval:  343 QTC Calculation: 436 R Axis:   38 Text Interpretation:  Confirmed by Donnald Garre, MD, Lebron Conners [phone]) on 01/26/2016 6:41:18 PM       Radiology Dg Chest 2 View  Result Date: 01/26/2016 CLINICAL DATA:  Chest pressure. EXAM: CHEST  2 VIEW COMPARISON:  07/18/2009 FINDINGS: The heart size and mediastinal contours are within normal limits. Both lungs are clear. The visualized skeletal structures are unremarkable. IMPRESSION: No active cardiopulmonary disease. Electronically Signed   By: Signa Kell M.D.   On: 01/26/2016 18:31   Ct Angio Chest Aorta W And/or Wo Contrast  Result Date: 01/26/2016 CLINICAL  DATA:  Severe acute chest pain and shortness of breath. EXAM: CT ANGIOGRAPHY CHEST WITH CONTRAST TECHNIQUE: Multidetector CT imaging of the chest was performed using the standard protocol before and during bolus administration of intravenous contrast. Multiplanar CT image reconstructions and MIPs were obtained to evaluate the vascular anatomy. CONTRAST:  100 cc Isovue 370 COMPARISON:  Chest x-ray dated 01/26/2016 and chest CT angiogram dated 07/18/2009 FINDINGS: Mediastinum/Lymph Nodes: No pulmonary emboli or thoracic aortic dissection identified. No masses or pathologically enlarged lymph nodes identified. Lungs/Pleura: No pulmonary mass, infiltrate, or effusion. The 2 tiny areas of scarring at the left lung base laterally are unchanged since 2011. Upper abdomen: The gallbladder has been removed. Otherwise, normal. Musculoskeletal: No chest wall mass or suspicious bone lesions identified. Review of the MIP images confirms the above findings. IMPRESSION: Essentially normal CT angiogram of the chest. Electronically Signed   By: Fayrene Fearing  Maxwell M.D.   On: 01/26/2016 19:08    Procedures Procedures (including critical care time)  Medications Ordered in ED Medications  LORazepam (ATIVAN) tablet 1 mg (1 mg Oral Refused 01/26/16 2108)  HYDROmorphone (DILAUDID) injection 0.5 mg (0.5 mg Intravenous Given 01/26/16 1751)  ondansetron (ZOFRAN) injection 4 mg (4 mg Intravenous Given 01/26/16 1751)  iopamidol (ISOVUE-370) 76 % injection (100 mLs  Contrast Given 01/26/16 1831)     Initial Impression / Assessment and Plan / ED Course  I have reviewed the triage vital signs and the nursing notes.  Pertinent labs & imaging results that were available during my care of the patient were reviewed by me and considered in my medical decision making (see chart for details).  Clinical Course    Vitals:   01/26/16 1945 01/26/16 2000 01/26/16 2030 01/26/16 2115  BP: 135/91 130/85 130/84 133/89  Pulse: 95 92    Resp: 18  17 14 14   Temp:      TempSrc:      SpO2: 100% 100%      Medications  LORazepam (ATIVAN) tablet 1 mg (1 mg Oral Refused 01/26/16 2108)  HYDROmorphone (DILAUDID) injection 0.5 mg (0.5 mg Intravenous Given 01/26/16 1751)  ondansetron (ZOFRAN) injection 4 mg (4 mg Intravenous Given 01/26/16 1751)  iopamidol (ISOVUE-370) 76 % injection (100 mLs  Contrast Given 01/26/16 1831)    Donnie AhoBonnie W Phelps is 35 y.o. female presenting with Ear acute onset of chest pain radiating to the jaw and bilateral arms and associated with numbness. My neurologic exam is nonfocal however given the severity of her pain in her distress with the numbness dissection is considered. EKG with no acute changes. Patient's bicarbonate is low, I was told by EMS that she was hyperventilating in route, I think that likely the source of a low bicarbonate initial troponin negative. Will obtain dissection study of the chest.  Dissection study negative, her pain has improved with Dilaudid but it still persists, I think that the phentermine may be the source of her pain and discomfort, think is likely exacerbating her underlying palpitations, will try to counteract this with Ativan.  Discussed with attending who agrees with care plan of delta troponin and discharged home with follow-up with cardiology and DC of phentermine.  Second troponin has resulted as negative. Discussed with patient who seems upset, I have reassured her that her workup here today is negative, I think that the phentermine may be the source of the discomfort and have advised her to DC this, patient verbalizes understanding. I think she is upset because I offered her Ativan, was informed by nurse that she seemed displeased with this. I explained the reasoning of the Ativan.   Was asked by ataxia going to the room, it appears that her mother is upset, stating that she hasn't seen a cardiologist, I have advised the patient that I am happy to have the attending emergency  medicine physician evaluate her daughter, she states that her daughter is upset and wants to leave.  Evaluation does not show pathology that would require ongoing emergent intervention or inpatient treatment. Pt is hemodynamically stable and mentating appropriately. Discussed findings and plan with patient/guardian, who agrees with care plan. All questions answered. Return precautions discussed and outpatient follow up given.    Final Clinical Impressions(s) / ED Diagnoses   Final diagnoses:  Atypical chest pain    New Prescriptions New Prescriptions   No medications on file     Wynetta Emeryicole Lennis Rader, PA-C 01/26/16 2313  Arby Barrette, MD 01/26/16 [phone]

## 2016-01-26 NOTE — ED Triage Notes (Addendum)
Pt arrived by [facility]. Reports onset this afternoon of chest pain, radiates into her neck and has tingling to bilateral hands. Received ASA and nitro pta.

## 2016-01-26 NOTE — ED Notes (Signed)
Phlebotomy at bedside to draw Troponin. RN informed patient that Ativan ordered for patient. Patient stated "that's for sleep." RN educated patient that Ativan is also an antianxiety medication, and will benefit patient. Patient began frowning, raised voice, and stated "I don't take that. I don't want it." RN informed patient that she has the right to refuse the medication. Patient appeared angry. Medication not administered to patient - per patient request. Family at bedside.

## 2016-01-26 NOTE — ED Notes (Addendum)
Patient had removed blood pressure cuff & pulse ox. Sitting on side of bed, fully dressed. Declined vital signs for discharge. Stated "I'm ready to go."

## 2016-01-26 NOTE — ED Notes (Addendum)
Patient angry and hostile towards staff that she is being discharged & not admitted for observation. Patient stated that she does not understand why she was not seen by cardiology while in the ED. RN reviewed that patient's labs and tests were negative, and that she is to follow up with her cardiologist. Patient angrily stated that her cardiologist is out of town until Sept 5th, but that she'll "be sure to call Monday and see one of the doctors at that office." Attempted to reassure patient that labs/tests were negative. Patient continued to appear angry. RN asked patient if she would like to see MD or PA to discuss further. Patient stated "no, I'm just ready to leave here." Patient discharged. Declined wheelchair at time of discharge.

## 2016-01-26 NOTE — Discharge Instructions (Signed)
Please stop taking the phentermine, do not hesitate to return to the emergency department for any new, worsening or concerning symptoms.  Please follow with your cardiologist next week.  Please let your primary care physician none the were seen in the emergency department, they should obtain records for follow-up as an outpatient.

## 2016-01-28 ENCOUNTER — Encounter (HOSPITAL_COMMUNITY): Payer: Self-pay | Admitting: Emergency Medicine

## 2016-01-28 ENCOUNTER — Emergency Department (HOSPITAL_COMMUNITY)
Admission: EM | Admit: 2016-01-28 | Discharge: 2016-01-28 | Disposition: A | Discharge status: Home / Self Care | Payer: BLUE CROSS/BLUE SHIELD | Attending: Emergency Medicine | Admitting: Emergency Medicine

## 2016-01-28 ENCOUNTER — Telehealth: Payer: Self-pay | Admitting: *Deleted

## 2016-01-28 ENCOUNTER — Emergency Department (HOSPITAL_COMMUNITY): Payer: BLUE CROSS/BLUE SHIELD

## 2016-01-28 DIAGNOSIS — R079 Chest pain, unspecified: Secondary | ICD-10-CM | POA: Diagnosis not present

## 2016-01-28 DIAGNOSIS — Z79899 Other long term (current) drug therapy: Secondary | ICD-10-CM | POA: Insufficient documentation

## 2016-01-28 DIAGNOSIS — H539 Unspecified visual disturbance: Secondary | ICD-10-CM | POA: Insufficient documentation

## 2016-01-28 DIAGNOSIS — Z87891 Personal history of nicotine dependence: Secondary | ICD-10-CM | POA: Diagnosis not present

## 2016-01-28 DIAGNOSIS — R531 Weakness: Secondary | ICD-10-CM | POA: Diagnosis not present

## 2016-01-28 DIAGNOSIS — R0789 Other chest pain: Secondary | ICD-10-CM | POA: Insufficient documentation

## 2016-01-28 DIAGNOSIS — R0602 Shortness of breath: Secondary | ICD-10-CM | POA: Diagnosis not present

## 2016-01-28 DIAGNOSIS — M791 Myalgia: Secondary | ICD-10-CM | POA: Insufficient documentation

## 2016-01-28 DIAGNOSIS — R61 Generalized hyperhidrosis: Secondary | ICD-10-CM | POA: Insufficient documentation

## 2016-01-28 DIAGNOSIS — Z7982 Long term (current) use of aspirin: Secondary | ICD-10-CM | POA: Diagnosis not present

## 2016-01-28 LAB — BASIC METABOLIC PANEL
ANION GAP: 11 (ref 5–15)
BUN: 16 mg/dL (ref 6–20)
CALCIUM: 9.4 mg/dL (ref 8.9–10.3)
CHLORIDE: 106 mmol/L (ref 101–111)
CO2: 19 mmol/L — ABNORMAL LOW (ref 22–32)
CREATININE: 0.85 mg/dL (ref 0.44–1.00)
GFR calc non Af Amer: >60 mL/min (ref >60)
Glucose, Bld: 96 mg/dL (ref 65–99)
Potassium: 3.5 mmol/L (ref 3.5–5.1)
SODIUM: 136 mmol/L (ref 135–145)

## 2016-01-28 LAB — CBC
HCT: 40.8 % (ref 36.0–46.0)
HEMOGLOBIN: 14.1 g/dL (ref 12.0–15.0)
MCH: 30.5 pg (ref 26.0–34.0)
MCHC: 34.6 g/dL (ref 30.0–36.0)
MCV: 88.1 fL (ref 78.0–100.0)
PLATELETS: 250 10*3/uL (ref 150–400)
RBC: 4.63 MIL/uL (ref 3.87–5.11)
RDW: 12.5 % (ref 11.5–15.5)
WBC: 5.9 10*3/uL (ref 4.0–10.5)

## 2016-01-28 LAB — TROPONIN I

## 2016-01-28 MED ORDER — SODIUM CHLORIDE 0.9 % IV SOLN: Freq: Once | INTRAVENOUS | Status: DC

## 2016-01-28 NOTE — Discharge Instructions (Signed)
Follow up with your Physician for recheck. Take your medications as directed  

## 2016-01-28 NOTE — ED Triage Notes (Signed)
Pt reports left sided cp radiating to left arm, left neck, left jaw since Saturday.  Pt has also had sob, diaphoresis, denies n/v/d.  [facility] and all tests were normal.  Pain has gotten worse since.

## 2016-01-28 NOTE — Telephone Encounter (Signed)
Pt walked in office c/o CP. Recent ED visit to [facility] by EMS, was told to stop phentermine - pt has not taken since ED visit. Says she has still been having CP/SOB and says feel like elephant sitting on her chest. Says she was given metoprolol 25 mg bid, but hasn't started this because she wanted to know what was going on first, and says she wasn't told anything - BP 124/84 HR 99. Pt says she wants to speak with Cardiologist. ED notes suggest she was offered but didn't want to wait.

## 2016-01-28 NOTE — ED Provider Notes (Signed)
[facility]-EMERG[facility]Y DEPT Provider Note   CSN: [id number] Arrival date & time: 01/28/16  1010   By signing my name below, I, Lindsey Phelps, attest that this documentation has been prepared under the direction and in the presence of Langston Masker, [place]. Electronically Signed: Christel Phelps, Scribe. 01/28/2016. 11:34 AM.   History   Chief Complaint Chief Complaint  Patient presents with  . Chest Pain    The history is provided by the patient. No language interpreter was used.   HPI Comments:  Lindsey Phelps is a 35 y.o. female with PMHx of anxiety who presents to the Emergency Department complaining of sudden onset, worsening left-sided chest pain x 2 days. Pt reports that pain radiates to L arm, L neck, and L jaw. Pt further reports intermittent episodes of "sharp and shooting" pain. Pt had a cough x 6-7 days. Pt complains of associated SOB and diaphoresis. Pt was seen at [facility] ED on Saturday, all tests were normal. Pt notes PHx of rapid heart beat. Pt began having pressure in her chest on x 2 days, her pulse went to 159 BPM, arms went numb, began having pain in shoulder, arm and jaw, vision went black then everything went fuzzy. Pt then was transported to [facility] via ambulance where her BP was 145/100. Pt states that her cardiologist told the her that taking phentermine was ok, but has not taken it in 2 days. Pt reports that she blew nose and had small amount of blood in R side. Pt denies fever, chills.   Pt has pertinent FHx of MI. Brother had a major MI in his 42s; grandfather died from MI in his 60s.  Pt is upset because she is sure pain is her heart and feels like nothing is being done.   Past Medical History:  Diagnosis Date  . Anxiety   . Heart palpitations   . History of IBS     Patient Active Problem List   Diagnosis Date Noted  . Leg swelling 12/30/2013  . Palpitations 09/16/2013  . Chest pain 09/16/2013    Past Surgical History:  Procedure Laterality Date  . ABDOMINAL  HYSTERECTOMY    . BLADDER NECK SUSPENSION    . CHOLECYSTECTOMY    . COLONOSCOPY  01/23/2011   Procedure: COLONOSCOPY;  Surgeon: Malissa Hippo, MD;  Location: [facility] ENDO SUITE;  Service: Endoscopy;  Laterality: N/A;  . LAPAROSCOPIC TUBAL LIGATION      OB History    No data available       Home Medications    Prior to Admission medications   Medication Sig Start Date End Date Taking? Authorizing Provider  aspirin 325 MG tablet Take 325 mg by mouth once.   Yes Historical Provider, MD  aspirin EC 81 MG tablet Take 324 mg by mouth once.   Yes Historical Provider, MD  cetirizine (ZYRTEC) 10 MG tablet Take 10 mg by mouth as needed for allergies.   Yes Historical Provider, MD  diazepam (VALIUM) 5 MG tablet Take 5 mg by mouth every 6 (six) hours as needed for anxiety.   Yes Historical Provider, MD  nitrofurantoin (MACRODANTIN) 50 MG capsule Take 50 mg by mouth 4 (four) times daily as needed (bladder).    Yes Historical Provider, MD  phentermine (ADIPEX-P) 37.5 MG tablet Take 37.5 mg by mouth daily. 01/10/16  Yes Historical Provider, MD  metoprolol tartrate (LOPRESSOR) 25 MG tablet Take 1 tablet (25 mg total) by mouth 2 (two) times daily. Patient not taking: Reported on 01/28/2016  01/25/16   Lindsey Gross, NP    Family History Family History  Problem Relation Age of Onset  . Diabetes Father   . Hypertension Father   . Colon polyps Father   . Diabetes Mother   . Hypertension Mother   . Coronary artery disease Mother   . COPD Mother     Social History Social History  Substance Use Topics  . Smoking status: Former Smoker    Packs/day: 0.50    Years: 15.00    Types: Cigarettes    Quit date: 09/16/2012  . Smokeless tobacco: Never Used  . Alcohol use 0.6 oz/week    1 Shots of liquor per week     Comment: every few months     Allergies   Adhesive [tape]; Darvocet [propoxyphene n-acetaminophen]; and Demerol   Review of Systems Review of Systems  Constitutional: Positive for  diaphoresis. Negative for chills and fever.  Eyes: Positive for visual disturbance.  Respiratory: Positive for shortness of breath.   Cardiovascular: Positive for chest pain.  Musculoskeletal: Positive for myalgias and neck pain.  Neurological: Positive for weakness and numbness.  Psychiatric/Behavioral: Negative for behavioral problems.     Physical Exam Updated Vital Signs BP 122/83 (BP Location: Left Arm)   Pulse 94   Temp 97.8 F (36.6 C) (Oral)   Resp 16   Ht 5\' 6"  (1.676 m)   Wt 194 lb (88 kg)   SpO2 100%   BMI 31.31 kg/m   Physical Exam  Constitutional: She appears well-developed and well-nourished. No distress.  HENT:  Head: Normocephalic and atraumatic.  Eyes: Conjunctivae are normal.  Cardiovascular: Normal rate.   Pulmonary/Chest: Effort normal.  Abdominal: She exhibits no distension.  Neurological: She is alert.  Skin: Skin is warm and dry.  Psychiatric: She has a normal mood and affect.  Nursing note and vitals reviewed.    ED Treatments / Results  DIAGNOSTIC STUDIES:  Oxygen Saturation is 100% on RA, normal by my interpretation.    COORDINATION OF CARE:  11:34 AM Discussed treatment plan with pt at bedside and pt agreed to plan.  Labs (all labs ordered are listed, but only abnormal results are displayed) Labs Reviewed  BASIC METABOLIC PANEL - Abnormal; Notable for the following:       Result Value   CO2 19 (*)    All other components within normal limits  CBC  TROPONIN I    EKG  EKG Interpretation None       Radiology Dg Chest 2 View  Result Date: 01/28/2016 CLINICAL DATA:  Left chest, chest tightness and left all arm and jaw pain since 01/26/2016. No known injury. EXAM: CHEST  2 VIEW COMPARISON:  CT chest and PA and lateral chest 01/26/2016. FINDINGS: The lungs are clear. Heart size is normal. No pneumothorax or pleural effusion. No bony abnormality. IMPRESSION: Negative chest. Electronically Signed   By: Drusilla Kanner M.D.   On:  01/28/2016 10:45   Dg Chest 2 View  Result Date: 01/26/2016 CLINICAL DATA:  Chest pressure. EXAM: CHEST  2 VIEW COMPARISON:  07/18/2009 FINDINGS: The heart size and mediastinal contours are within normal limits. Both lungs are clear. The visualized skeletal structures are unremarkable. IMPRESSION: No active cardiopulmonary disease. Electronically Signed   By: Signa Kell M.D.   On: 01/26/2016 18:31   Ct Angio Chest Aorta W And/or Wo Contrast  Result Date: 01/26/2016 CLINICAL DATA:  Severe acute chest pain and shortness of breath. EXAM: CT ANGIOGRAPHY CHEST WITH CONTRAST  TECHNIQUE: Multidetector CT imaging of the chest was performed using the standard protocol before and during bolus administration of intravenous contrast. Multiplanar CT image reconstructions and MIPs were obtained to evaluate the vascular anatomy. CONTRAST:  100 cc Isovue 370 COMPARISON:  Chest x-ray dated 01/26/2016 and chest CT angiogram dated 07/18/2009 FINDINGS: Mediastinum/Lymph Nodes: No pulmonary emboli or thoracic aortic dissection identified. No masses or pathologically enlarged lymph nodes identified. Lungs/Pleura: No pulmonary mass, infiltrate, or effusion. The 2 tiny areas of scarring at the left lung base laterally are unchanged since 2011. Upper abdomen: The gallbladder has been removed. Otherwise, normal. Musculoskeletal: No chest wall mass or suspicious bone lesions identified. Review of the MIP images confirms the above findings. IMPRESSION: Essentially normal CT angiogram of the chest. Electronically Signed   By: Francene BoyersJames  Maxwell M.D.   On: 01/26/2016 19:08    Procedures Procedures (including critical care time)  Medications Ordered in ED Medications - No data to display   Initial Impression / Assessment and Plan / ED Course  I have reviewed the triage vital signs and the nursing notes.  Pertinent labs & imaging results that were available during my care of the patient were reviewed by me and considered in my  medical decision making (see chart for details).  Clinical Course  EKG and labs reviewed.Troponin is negative  I discussed with Dr. Estell HarpinZammit.  He advised consulting Dr. Tenny Crawoss as this is patient second visit and she has had previous cardiac issues. I spoke with Dr. Tenny Crawoss who will see pt here.     Final Clinical Impressions(s) / ED Diagnoses   Final diagnoses:  Chest pain, unspecified chest pain type    New Prescriptions New Prescriptions   No medications on file   I personally performed the services in this documentation, which was scribed in my presence.  The recorded information has been reviewed and considered.   Barnet PallKaren SofiaPAC.   Lonia SkinnerLeslie K MakakiloSofia, PA-C 01/28/16 1406    Bethann BerkshireJoseph Zammit, MD 02/02/16 [phone]

## 2016-01-28 NOTE — ED Provider Notes (Signed)
[facility]ER[facility] DE[facility] Provider Note   CSN: [id number] Arrival date & time: 01/28/16  1010     History   Chief Complaint Chief Complaint  Patient presents with  . Chest Pain    HPI Lindsey Phelps is a 35 y.o. female.  The history is provided by the patient. No language interpreter was used.  Chest Pain   This is a recurrent problem. The current episode started more than 1 week ago. The problem occurs constantly. The problem has been gradually worsening. The pain is present in the substernal region and epigastric region. The pain is severe. The pain does not radiate. The symptoms are aggravated by deep breathing. Pertinent negatives include no shortness of breath. The treatment provided no relief. There are no known risk factors.  Pt reports she had blood from right side of her nose. Small drop   Past Medical History:  Diagnosis Date  . Anxiety   . Heart palpitations   . History of IBS     Patient Active Problem List   Diagnosis Date Noted  . Leg swelling 12/30/2013  . Palpitations 09/16/2013  . Chest pain 09/16/2013    Past Surgical History:  Procedure Laterality Date  . ABDOMINAL HYSTERECTOMY    . BLADDER NECK SUSPENSION    . CHOLECYSTECTOMY    . COLONOSCOPY  01/23/2011   Procedure: COLONOSCOPY;  Surgeon: Malissa Hippo, MD;  Location: [facility] SU[facility];  Service: Endoscopy;  Laterality: N/A;  . LAPAROSCOPIC TUBAL LIGATION      OB History    No data available       Home Medications    Prior to Admission medications   Medication Sig Start Date End Date Taking? Authorizing Provider  aspirin 325 MG tablet Take 325 mg by mouth once.   Yes Historical Provider, MD  aspirin EC 81 MG tablet Take 324 mg by mouth once.   Yes Historical Provider, MD  cetirizine (ZYRTEC) 10 MG tablet Take 10 mg by mouth as needed for allergies.   Yes Historical Provider, MD  diazepam (VALIUM) 5 MG tablet Take 5 mg by mouth every 6 (six) hours as needed for anxiety.   Yes Historical  Provider, MD  nitrofurantoin (MACRODANTIN) 50 MG capsule Take 50 mg by mouth 4 (four) times daily as needed (bladder).    Yes Historical Provider, MD  phentermine (ADIPEX-P) 37.5 MG tablet Take 37.5 mg by mouth daily. 01/10/16  Yes Historical Provider, MD  metoprolol tartrate (LOPRESSOR) 25 MG tablet Take 1 tablet (25 mg total) by mouth 2 (two) times daily. Patient not taking: Reported on 01/28/2016 01/25/16   Jodelle Gross, NP    Family History Family History  Problem Relation Age of Onset  . Diabetes Father   . Hypertension Father   . Colon polyps Father   . Diabetes Mother   . Hypertension Mother   . Coronary artery disease Mother   . COPD Mother     Social History Social History  Substance Use Topics  . Smoking status: Former Smoker    Packs/day: 0.50    Years: 15.00    Types: Cigarettes    Quit date: 09/16/2012  . Smokeless tobacco: Never Used  . Alcohol use 0.6 oz/week    1 Shots of liquor per week     Comment: every few months     Allergies   Adhesive [tape]; Darvocet [propoxyphene n-acetaminophen]; and Demerol   Review of Systems Review of Systems  Respiratory: Negative for shortness of breath.  Cardiovascular: Positive for chest pain.  All other systems reviewed and are negative.    Physical Exam Updated Vital Signs BP 122/69   Pulse 68   Temp 97.8 F (36.6 C) (Oral)   Resp 18   Ht 5\' 6"  (1.676 m)   Wt 88 kg   SpO2 99%   BMI 31.31 kg/m   Physical Exam  Constitutional: She appears well-developed and well-nourished. No distress.  HENT:  Head: Normocephalic and atraumatic.  Right Ear: External ear normal.  Left Ear: External ear normal.  Nose: Nose normal.  Mouth/Throat: Oropharynx is clear and moist.  Eyes: Conjunctivae are normal.  Neck: Neck supple.  Cardiovascular: Normal rate, regular rhythm and normal heart sounds.   No murmur heard. Pulmonary/Chest: Effort normal and breath sounds normal. No respiratory distress.  Abdominal: Soft.  Bowel sounds are normal. There is no tenderness.  Musculoskeletal: Normal range of motion. She exhibits no edema.  Neurological: She is alert. She has normal reflexes.  Skin: Skin is warm and dry.  Psychiatric: She has a normal mood and affect.  Nursing note and vitals reviewed.    ED Treatments / Results  Labs (all labs ordered are listed, but only abnormal results are displayed) Labs Reviewed  BASIC METABOLIC PANEL - Abnormal; Notable for the following:       Result Value   CO2 19 (*)    All other components within normal limits  CBC  TROPONIN I    EKG  EKG Interpretation None       Radiology Dg Chest 2 View  Result Date: 01/28/2016 CLINICAL DATA:  Left chest, chest tightness and left all arm and jaw pain since 01/26/2016. No known injury. EXAM: CHEST  2 VIEW COMPARISON:  CT chest and PA and lateral chest 01/26/2016. FINDINGS: The lungs are clear. Heart size is normal. No pneumothorax or pleural effusion. No bony abnormality. IMPRESSION: Negative chest. Electronically Signed   By: Drusilla Kanner M.D.   On: 01/28/2016 10:45   Dg Chest 2 View  Result Date: 01/26/2016 CLINICAL DATA:  Chest pressure. EXAM: CHEST  2 VIEW COMPARISON:  07/18/2009 FINDINGS: The heart size and mediastinal contours are within normal limits. Both lungs are clear. The visualized skeletal structures are unremarkable. IMPRESSION: No active cardiopulmonary disease. Electronically Signed   By: Signa Kell M.D.   On: 01/26/2016 18:31   Ct Angio Chest Aorta W And/or Wo Contrast  Result Date: 01/26/2016 CLINICAL DATA:  Severe acute chest pain and shortness of breath. EXAM: CT ANGIOGRAPHY CHEST WITH CONTRAST TECHNIQUE: Multidetector CT imaging of the chest was performed using the standard protocol before and during bolus administration of intravenous contrast. Multiplanar CT image reconstructions and MIPs were obtained to evaluate the vascular anatomy. CONTRAST:  100 cc Isovue 370 COMPARISON:  Chest x-ray  dated 01/26/2016 and chest CT angiogram dated 07/18/2009 FINDINGS: Mediastinum/Lymph Nodes: No pulmonary emboli or thoracic aortic dissection identified. No masses or pathologically enlarged lymph nodes identified. Lungs/Pleura: No pulmonary mass, infiltrate, or effusion. The 2 tiny areas of scarring at the left lung base laterally are unchanged since 2011. Upper abdomen: The gallbladder has been removed. Otherwise, normal. Musculoskeletal: No chest wall mass or suspicious bone lesions identified. Review of the MIP images confirms the above findings. IMPRESSION: Essentially normal CT angiogram of the chest. Electronically Signed   By: Francene Boyers M.D.   On: 01/26/2016 19:08    Procedures Procedures (including critical care time)  Medications Ordered in ED Medications - No data to display  Initial Impression / Assessment and Plan / ED Course  I have reviewed the triage vital signs and the nursing notes.  Pertinent labs & imaging results that were available during my care of the patient were reviewed by me and considered in my medical decision making (see chart for details).  Clinical Course    Pt had 2 negative troponins 2 days ago.  Negative chest CT.  I discussed with Dr. Estell HarpinZammit.  He recommended call Dr. Tenny Crawoss.   Dr. Tenny Crawoss agreed to see Pt here.  Pt and family upset about wait.  I reassured test are normal  Pt seen in Fast track in order for her to be seen sooner.   Final Clinical Impressions(s) / ED Diagnoses   Final diagnoses:  Chest pain, unspecified chest pain type    New Prescriptions New Prescriptions   No medications on file     Elson AreasLeslie K Sofia, PA-C 01/28/16 [id number]    Leslie K MechanicsvilleSofia, PA-C 01/28/16 1631    Bethann BerkshireJoseph Zammit, MD 02/02/16 [phone]

## 2016-01-28 NOTE — ED Notes (Addendum)
Error

## 2016-01-28 NOTE — Telephone Encounter (Signed)
Pt agreeable to report to ED with symptoms, will have her mother take to [facility]. Will forward to DOD [facility]

## 2016-01-28 NOTE — ED Notes (Signed)
Pt aware of care plan and that Dr. Tenny Craw to see pt in ED.

## 2016-01-28 NOTE — Consult Note (Signed)
Primary Physician: Primary Cardiologist:  Purvis Sheffield    Asked to see re CP    HPI: Pt is a 35 yo with no known CAD  Present with CP  Pain has been a rel constant pressure and episodic pain  She has had for a couple of days  Seen at [facility] ER on Saturday  CT done  Negative  No evidence of coronary calcifications.  She also noted some rapid HR over the past couple days  Some dizziness No syncope   Taking phentermine    She has not had much to eatover the past few days  2 bananas  A little fluid          Past Medical History:  Diagnosis Date  . Anxiety   . Heart palpitations   . History of IBS      (Not in a hospital admission)     Infusions:   Allergies  Allergen Reactions  . Adhesive [Tape]     Including bandaids  . Darvocet [Propoxyphene N-Acetaminophen]   . Demerol     Social History   Social History  . Marital status: Married    Spouse name: N/A  . Number of children: N/A  . Years of education: N/A   Occupational History  . Not on file.   Social History Main Topics  . Smoking status: Former Smoker    Packs/day: 0.50    Years: 15.00    Types: Cigarettes    Quit date: 09/16/2012  . Smokeless tobacco: Never Used  . Alcohol use 0.6 oz/week    1 Shots of liquor per week     Comment: every few months  . Drug use: No  . Sexual activity: Not on file   Other Topics Concern  . Not on file   Social History Narrative  . No narrative on file    Family History  Problem Relation Age of Onset  . Diabetes Father   . Hypertension Father   . Colon polyps Father   . Diabetes Mother   . Hypertension Mother   . Coronary artery disease Mother   . COPD Mother     REVIEW OF SYSTEMS:  All systems reviewed  Negative to the above problem except as noted above.    PHYSICAL EXAM: Vitals:   01/28/16 1500 01/28/16 1530  BP:    Pulse: 76 90  Resp: 17 19  Temp:      No intake or output data in the 24 hours ending 01/28/16 1642  General:  Well appearing.  No respiratory difficulty HEENT: normal Neck: supple. no JVD. Carotids 2+ bilat; no bruits. No lymphadenopathy or thryomegaly appreciated. Cor: PMI nondisplaced. Regular rate & rhythm. No rubs, gallops or murmurs. Chest  Tender   Lungs: clear Abdomen: soft, nontender, nondistended. No hepatosplenomegaly. No bruits or masses. Good bowel sounds. Extremities: no cyanosis, clubbing, rash, edema Neuro: alert & oriented x 3, cranial nerves grossly intact. moves all 4 extremities w/o difficulty. Affect pleasant.  ECG:  SR  Nonspecific ST changes    Results for orders placed or performed during the hospital encounter of 01/28/16 (from the past 24 hour(s))  Basic metabolic panel     Status: Abnormal   Collection Time: 01/28/16 10:38 AM  Result Value Ref Range   Sodium 136 135 - 145 mmol/L   Potassium 3.5 3.5 - 5.1 mmol/L   Chloride 106 101 - 111 mmol/L   CO2 19 (L) 22 - 32 mmol/L   Glucose, Bld 96  65 - 99 mg/dL   BUN 16 6 - 20 mg/dL   Creatinine, Ser 4.090.85 0.44 - 1.00 mg/dL   Calcium 9.4 8.9 - 81.110.3 mg/dL   GFR calc non Af Amer >60 >60 mL/min   GFR calc Af Amer >60 >60 mL/min   Anion gap 11 5 - 15  CBC     Status: None   Collection Time: 01/28/16 10:38 AM  Result Value Ref Range   WBC 5.9 4.0 - 10.5 K/uL   RBC 4.63 3.87 - 5.11 MIL/uL   Hemoglobin 14.1 12.0 - 15.0 g/dL   HCT 91.440.8 78.236.0 - 95.646.0 %   MCV 88.1 78.0 - 100.0 fL   MCH 30.5 26.0 - 34.0 pg   MCHC 34.6 30.0 - 36.0 g/dL   RDW 21.312.5 08.611.5 - 57.815.5 %   Platelets 250 150 - 400 K/uL  Troponin I     Status: None   Collection Time: 01/28/16 10:38 AM  Result Value Ref Range   Troponin I <0.03 <0.03 ng/mL   Dg Chest 2 View  Result Date: 01/28/2016 CLINICAL DATA:  Left chest, chest tightness and left all arm and jaw pain since 01/26/2016. No known injury. EXAM: CHEST  2 VIEW COMPARISON:  CT chest and PA and lateral chest 01/26/2016. FINDINGS: The lungs are clear. Heart size is normal. No pneumothorax or pleural effusion. No bony abnormality.  IMPRESSION: Negative chest. Electronically Signed   By: Drusilla Kannerhomas  Dalessio M.D.   On: 01/28/2016 10:45   Dg Chest 2 View  Result Date: 01/26/2016 CLINICAL DATA:  Chest pressure. EXAM: CHEST  2 VIEW COMPARISON:  07/18/2009 FINDINGS: The heart size and mediastinal contours are within normal limits. Both lungs are clear. The visualized skeletal structures are unremarkable. IMPRESSION: No active cardiopulmonary disease. Electronically Signed   By: Signa Kellaylor  Stroud M.D.   On: 01/26/2016 18:31   Ct Angio Chest Aorta W And/or Wo Contrast  Result Date: 01/26/2016 CLINICAL DATA:  Severe acute chest pain and shortness of breath. EXAM: CT ANGIOGRAPHY CHEST WITH CONTRAST TECHNIQUE: Multidetector CT imaging of the chest was performed using the standard protocol before and during bolus administration of intravenous contrast. Multiplanar CT image reconstructions and MIPs were obtained to evaluate the vascular anatomy. CONTRAST:  100 cc Isovue 370 COMPARISON:  Chest x-ray dated 01/26/2016 and chest CT angiogram dated 07/18/2009 FINDINGS: Mediastinum/Lymph Nodes: No pulmonary emboli or thoracic aortic dissection identified. No masses or pathologically enlarged lymph nodes identified. Lungs/Pleura: No pulmonary mass, infiltrate, or effusion. The 2 tiny areas of scarring at the left lung base laterally are unchanged since 2011. Upper abdomen: The gallbladder has been removed. Otherwise, normal. Musculoskeletal: No chest wall mass or suspicious bone lesions identified. Review of the MIP images confirms the above findings. IMPRESSION: Essentially normal CT angiogram of the chest. Electronically Signed   By: Francene BoyersJames  Maxwell M.D.   On: 01/26/2016 19:08     ASSESSMENT: CP  I do not think it is due to coronary artery dz/cardiac ischemia   Trop negative despite prolonged discomfort   ? If she is orthostatic with intercostal pain I would recomm orthstatic vital signs  Could try hydrating   Consider trial of pain meds     I do  not think phentermine is good choic for this pt with history of palpitations  And CP     Lindsey PatesPaula Phelps   Addendum:  Orthostatic BP stays rel stable  HR 70 to 100  Evid of some volume depletion  I would recomm hydrate  Pt does not want IV  Drink fluids  Increase salt  OK to d/c.  Lindsey Phelps

## 2016-01-28 NOTE — ED Notes (Signed)
Dr Tenny Craw in to see pt

## 2016-02-01 DIAGNOSIS — E6609 Other obesity due to excess calories: Secondary | ICD-10-CM | POA: Diagnosis not present

## 2016-02-01 DIAGNOSIS — Z6832 Body mass index (BMI) 32.0-32.9, adult: Secondary | ICD-10-CM | POA: Diagnosis not present

## 2016-02-01 DIAGNOSIS — J069 Acute upper respiratory infection, unspecified: Secondary | ICD-10-CM | POA: Diagnosis not present

## 2016-02-01 DIAGNOSIS — Z1389 Encounter for screening for other disorder: Secondary | ICD-10-CM | POA: Diagnosis not present

## 2016-04-14 ENCOUNTER — Ambulatory Visit: Payer: BLUE CROSS/BLUE SHIELD | Admitting: Cardiovascular Disease

## 2016-05-09 ENCOUNTER — Encounter: Payer: Self-pay | Admitting: Cardiovascular Disease

## 2016-05-09 ENCOUNTER — Ambulatory Visit (INDEPENDENT_AMBULATORY_CARE_PROVIDER_SITE_OTHER): Payer: BLUE CROSS/BLUE SHIELD | Admitting: Cardiovascular Disease

## 2016-05-09 VITALS — BP 122/82 | HR 94 | Ht 66.0 in | Wt 191.0 lb

## 2016-05-09 DIAGNOSIS — I10 Essential (primary) hypertension: Secondary | ICD-10-CM

## 2016-05-09 DIAGNOSIS — Z9289 Personal history of other medical treatment: Secondary | ICD-10-CM

## 2016-05-09 DIAGNOSIS — R002 Palpitations: Secondary | ICD-10-CM | POA: Diagnosis not present

## 2016-05-09 DIAGNOSIS — R000 Tachycardia, unspecified: Secondary | ICD-10-CM | POA: Diagnosis not present

## 2016-05-09 DIAGNOSIS — F419 Anxiety disorder, unspecified: Secondary | ICD-10-CM

## 2016-05-09 NOTE — Progress Notes (Signed)
SUBJECTIVE: The patient presents for follow-up of tachycardia and palpitations. Saw Dr. Tenny Crawoss in August 2017 for complaints of chest pain. She was reportedly dehydrated and her pain was not deemed to be cardiac in etiology. Had a normal CT angiogram of the chest on 01/26/16.  She has had less frequent episodes since starting metoprolol 25 g twice daily. She carries a lot of anxiety and stress and we talked extensively about this.   Soc: Her brother Mitchell HeirMark Wyatt is also my patient. Works at [employer] in [place].  Review of Systems: As per "subjective", otherwise negative.  Allergies  Allergen Reactions  . Adhesive [Tape]     Including bandaids  . Darvocet [Propoxyphene N-Acetaminophen]   . Demerol     Current Outpatient Prescriptions  Medication Sig Dispense Refill  . aspirin 325 MG tablet Take 325 mg by mouth as needed.     . cetirizine (ZYRTEC) 10 MG tablet Take 10 mg by mouth as needed for allergies.    . diazepam (VALIUM) 5 MG tablet Take 5 mg by mouth every 6 (six) hours as needed for anxiety.    . metoprolol tartrate (LOPRESSOR) 25 MG tablet Take 1 tablet (25 mg total) by mouth 2 (two) times daily. 60 tablet 6  . nitrofurantoin (MACRODANTIN) 50 MG capsule Take 50 mg by mouth 4 (four) times daily as needed (bladder).     . phentermine (ADIPEX-P) 37.5 MG tablet Take 37.5 mg by mouth daily.  2   No current facility-administered medications for this visit.     Past Medical History:  Diagnosis Date  . Anxiety   . Heart palpitations   . History of IBS     Past Surgical History:  Procedure Laterality Date  . ABDOMINAL HYSTERECTOMY    . BLADDER NECK SUSPENSION    . CHOLECYSTECTOMY    . COLONOSCOPY  01/23/2011   Procedure: COLONOSCOPY;  Surgeon: Malissa HippoNajeeb U Rehman, MD;  Location: [facility];  Service: Endoscopy;  Laterality: N/A;  . LAPAROSCOPIC TUBAL LIGATION      Social History   Social History  . Marital status: Married    Spouse name: N/A  . Number of children:  N/A  . Years of education: N/A   Occupational History  . Not on file.   Social History Main Topics  . Smoking status: Former Smoker    Packs/day: 0.50    Years: 15.00    Types: Cigarettes    Quit date: 09/16/2012  . Smokeless tobacco: Never Used  . Alcohol use 0.6 oz/week    1 Shots of liquor per week     Comment: every few months  . Drug use: No  . Sexual activity: Not on file   Other Topics Concern  . Not on file   Social History Narrative  . No narrative on file     Vitals:   05/09/16 0837  BP: 122/82  Pulse: 94  SpO2: 98%  Weight: 191 lb (86.6 kg)  Height: 5\' 6"  (1.676 m)    PHYSICAL EXAM General: NAD HEENT: Normal. Neck: No JVD, no thyromegaly. Lungs: Clear to auscultation bilaterally with normal respiratory effort. CV: Nondisplaced PMI.  Regular rate and rhythm, normal S1/S2, no S3/S4, no murmur. No pretibial or periankle edema.  No carotid bruit.   Abdomen: Soft, nontender, no distention.  Neurologic: Alert and oriented.  Psych: Normal affect. Skin: Normal. Musculoskeletal: No gross deformities.    ECG: Most recent ECG reviewed.      ASSESSMENT AND  PLAN: 1. Tachycardia and palpitations: Continue metoprolol 25 mg bid for what appears to be an inappropriate sinus tachycardia. I think it is likely driven by an underlying generalized anxiety disorder.  2. Essential HTN: Controlled. No changes.  3. Anxiety disorder: I have recommended she speak with her PCP about seeking cognitive behavioral therapy and perhaps medications, as this is causing her symptoms.  Dispo: fu 1 yr.  Time spent: 40 minutes, of which greater than 50% was spent reviewing symptoms, relevant blood tests and studies, and discussing management plan with the patient.  Prentice Docker, M.D., F.A.C.C.

## 2016-05-09 NOTE — Patient Instructions (Signed)
Your physician wants you to follow-up in: 1 YEAR WITH DR KONESWARAN You will receive a reminder letter in the mail two months in advance. If you don't receive a letter, please call our office to schedule the follow-up appointment.  Your physician recommends that you continue on your current medications as directed. Please refer to the Current Medication list given to you today.  Thank you for choosing [facility]!!    

## 2016-05-13 ENCOUNTER — Emergency Department (HOSPITAL_COMMUNITY)
Admission: EM | Admit: 2016-05-13 | Discharge: 2016-05-13 | Disposition: A | Discharge status: Home / Self Care | Payer: BLUE CROSS/BLUE SHIELD | Attending: Emergency Medicine | Admitting: Emergency Medicine

## 2016-05-13 ENCOUNTER — Encounter (HOSPITAL_COMMUNITY): Payer: Self-pay | Admitting: Emergency Medicine

## 2016-05-13 DIAGNOSIS — Z79899 Other long term (current) drug therapy: Secondary | ICD-10-CM | POA: Insufficient documentation

## 2016-05-13 DIAGNOSIS — Z7982 Long term (current) use of aspirin: Secondary | ICD-10-CM | POA: Insufficient documentation

## 2016-05-13 DIAGNOSIS — Y939 Activity, unspecified: Secondary | ICD-10-CM | POA: Diagnosis not present

## 2016-05-13 DIAGNOSIS — W270XXA Contact with workbench tool, initial encounter: Secondary | ICD-10-CM | POA: Insufficient documentation

## 2016-05-13 DIAGNOSIS — S01511A Laceration without foreign body of lip, initial encounter: Secondary | ICD-10-CM

## 2016-05-13 DIAGNOSIS — Y929 Unspecified place or not applicable: Secondary | ICD-10-CM | POA: Diagnosis not present

## 2016-05-13 DIAGNOSIS — Y999 Unspecified external cause status: Secondary | ICD-10-CM | POA: Diagnosis not present

## 2016-05-13 DIAGNOSIS — Z87891 Personal history of nicotine dependence: Secondary | ICD-10-CM | POA: Insufficient documentation

## 2016-05-13 MED ORDER — LIDOCAINE HCL (PF) 1 % IJ SOLN
5.0000 mL | Freq: Once | INTRAMUSCULAR | Status: AC
  Administered 2016-05-13: 5 mL via INTRADERMAL
  Filled 2016-05-13: qty 5

## 2016-05-13 NOTE — Discharge Instructions (Signed)
Soft foods and liquids.  Ibuprofen if needed for pain.  Ice packs applied on/off will help with swelling and bruising.  The sutures will dissolve and the dermabond will begin to peel off in a week or so

## 2016-05-13 NOTE — ED Triage Notes (Signed)
Pt states she was hitting something with a hammer and it popped up and hit her in the face. Pt bit through her bottom lip on the R side.

## 2016-05-13 NOTE — ED Provider Notes (Signed)
[facility] Provider Note   CSN: [id number] Arrival date & time: 05/13/16  1027     History   Chief Complaint Chief Complaint  Patient presents with  . Laceration    HPI Lindsey Phelps is a 35 y.o. female.  HPI  Lindsey Phelps is a 35 y.o. female who presents to the Emergency Department complaining of laceration to the lower right lip.  She states that she was hammering on a piece of plastic and it struck her mouth causing her tooth to cut the inside of her lip.  She reports pain and swelling to her lip with a small amt of bleeding.  Td is up to date.  She denies facial injury, dental pain or injury.  Past Medical History:  Diagnosis Date  . Anxiety   . Heart palpitations   . History of IBS     Patient Active Problem List   Diagnosis Date Noted  . Leg swelling 12/30/2013  . Palpitations 09/16/2013  . Chest pain 09/16/2013    Past Surgical History:  Procedure Laterality Date  . ABDOMINAL HYSTERECTOMY    . BLADDER NECK SUSPENSION    . CHOLECYSTECTOMY    . COLONOSCOPY  01/23/2011   Procedure: COLONOSCOPY;  Surgeon: Malissa Hippo, MD;  Location: [facility];  Service: Endoscopy;  Laterality: N/A;  . LAPAROSCOPIC TUBAL LIGATION      OB History    Gravida Para Term Preterm AB Living             0   SAB TAB Ectopic Multiple Live Births                   Home Medications    Prior to Admission medications   Medication Sig Start Date End Date Taking? Authorizing Provider  metoprolol tartrate (LOPRESSOR) 25 MG tablet Take 1 tablet (25 mg total) by mouth 2 (two) times daily. 01/25/16  Yes Jodelle Gross, NP  phentermine (ADIPEX-P) 37.5 MG tablet Take 37.5 mg by mouth daily. 01/10/16  Yes Historical Provider, MD  aspirin 325 MG tablet Take 325 mg by mouth as needed.     Historical Provider, MD  cetirizine (ZYRTEC) 10 MG tablet Take 10 mg by mouth as needed for allergies.    Historical Provider, MD  diazepam (VALIUM) 5 MG tablet Take 5 mg by mouth  every 6 (six) hours as needed for anxiety.    Historical Provider, MD  nitrofurantoin (MACRODANTIN) 50 MG capsule Take 50 mg by mouth 4 (four) times daily as needed (bladder).     Historical Provider, MD    Family History Family History  Problem Relation Age of Onset  . Diabetes Father   . Hypertension Father   . Colon polyps Father   . Diabetes Mother   . Hypertension Mother   . Coronary artery disease Mother   . COPD Mother     Social History Social History  Substance Use Topics  . Smoking status: Former Smoker    Packs/day: 0.50    Years: 15.00    Types: Cigarettes    Quit date: 09/16/2012  . Smokeless tobacco: Never Used  . Alcohol use 0.6 oz/week    1 Shots of liquor per week     Comment: every few months     Allergies   Adhesive [tape]; Darvocet [propoxyphene n-acetaminophen]; and Demerol   Review of Systems Review of Systems  Constitutional: Negative for chills and fever.  HENT: Negative for dental problem, ear pain, facial  swelling, sore throat and trouble swallowing.        Lip laceration  Eyes: Negative for pain.  Respiratory: Negative for shortness of breath.   Cardiovascular: Negative for chest pain.  Gastrointestinal: Negative for nausea and vomiting.  Musculoskeletal: Negative for arthralgias, back pain and joint swelling.  Skin: Positive for wound.  Neurological: Negative for dizziness, weakness, numbness and headaches.  Hematological: Does not bruise/bleed easily.  All other systems reviewed and are negative.    Physical Exam Updated Vital Signs BP 133/80 (BP Location: Left Arm)   Pulse 72   Temp 98.2 F (36.8 C) (Oral)   Resp 18   Ht 5\' 6"  (1.676 m)   Wt 86.6 kg   SpO2 100%   BMI 30.83 kg/m   Physical Exam  Constitutional: She is oriented to person, place, and time. She appears well-developed and well-nourished. No distress.  HENT:  Head: Normocephalic and atraumatic.  Right Ear: Tympanic membrane and ear canal normal.  Left Ear:  Tympanic membrane and ear canal normal.  Mouth/Throat: Uvula is midline, oropharynx is clear and moist and mucous membranes are normal. No trismus in the jaw. Normal dentition. Lacerations present. No uvula swelling.    Through and through laceration to the right lower lip.  Bleeding controlled.  Mild edema.  No dental fx's , no malocclusion.    Eyes: EOM are normal. Pupils are equal, round, and reactive to light.  Neck: Normal range of motion.  Cardiovascular: Normal rate, regular rhythm and intact distal pulses.   Pulmonary/Chest: Effort normal and breath sounds normal. No respiratory distress.  Musculoskeletal: She exhibits no edema or tenderness.  Neurological: She is alert and oriented to person, place, and time. She exhibits normal muscle tone. Coordination normal.  Skin: Skin is warm. Laceration noted.  Psychiatric: She has a normal mood and affect.  Nursing note and vitals reviewed.    ED Treatments / Results  Labs (all labs ordered are listed, but only abnormal results are displayed) Labs Reviewed - No data to display  EKG  EKG Interpretation None       Radiology No results found.  Procedures Procedures (including critical care time)  LACERATION REPAIR Performed by: Cherisse Carrell L. Authorized by: Maxwell Caul Consent: Verbal consent obtained. Risks and benefits: risks, benefits and alternatives were discussed Consent given by: patient Patient identity confirmed: provided demographic data Prepped and Draped in normal sterile fashion Wound explored  Laceration Location: lower lip  Laceration Length: 1 cm  No Foreign Bodies seen or palpated  Anesthesia: local infiltration  Local anesthetic: lidocaine 1 % w/o epinephrine  Anesthetic total: 1 ml  Irrigation method: syringe Amount of cleaning: standard  Skin closure: 5-0 chromic  Number of sutures: 2  Technique: simple interrupted  Patient tolerance: Patient tolerated the procedure well with  no immediate complications.   Outer lip closed with topical application of dermabond.  Bleeding controlled  Medications Ordered in ED Medications  lidocaine (PF) (XYLOCAINE) 1 % injection 5 mL (not administered)     Initial Impression / Assessment and Plan / ED Course  I have reviewed the triage vital signs and the nursing notes.  Pertinent labs & imaging results that were available during my care of the patient were reviewed by me and considered in my medical decision making (see chart for details).  Clinical Course    Pt also seen by Dr. Adriana Simas and care plan discussed.  Td is up to date  Final Clinical Impressions(s) / ED Diagnoses   Final  diagnoses:  Lip laceration, initial encounter    New Prescriptions New Prescriptions   No medications on file     Rosey Bathammy Antonisha Waskey, PA-C 05/13/16 2002    Donnetta HutchingBrian Cook, MD 05/16/16 [phone]

## 2016-06-23 DIAGNOSIS — E663 Overweight: Secondary | ICD-10-CM | POA: Diagnosis not present

## 2016-06-23 DIAGNOSIS — E669 Obesity, unspecified: Secondary | ICD-10-CM | POA: Diagnosis not present

## 2016-06-23 DIAGNOSIS — Z1389 Encounter for screening for other disorder: Secondary | ICD-10-CM | POA: Diagnosis not present

## 2016-06-23 DIAGNOSIS — Z6828 Body mass index (BMI) 28.0-28.9, adult: Secondary | ICD-10-CM | POA: Diagnosis not present

## 2017-01-13 DIAGNOSIS — F419 Anxiety disorder, unspecified: Secondary | ICD-10-CM | POA: Diagnosis not present

## 2017-01-13 DIAGNOSIS — Z1389 Encounter for screening for other disorder: Secondary | ICD-10-CM | POA: Diagnosis not present

## 2017-01-13 DIAGNOSIS — Z6830 Body mass index (BMI) 30.0-30.9, adult: Secondary | ICD-10-CM | POA: Diagnosis not present

## 2017-01-13 DIAGNOSIS — Z79899 Other long term (current) drug therapy: Secondary | ICD-10-CM | POA: Diagnosis not present

## 2017-02-20 DIAGNOSIS — H539 Unspecified visual disturbance: Secondary | ICD-10-CM | POA: Diagnosis not present

## 2017-02-20 DIAGNOSIS — Z79899 Other long term (current) drug therapy: Secondary | ICD-10-CM | POA: Diagnosis not present

## 2017-02-20 DIAGNOSIS — Z6831 Body mass index (BMI) 31.0-31.9, adult: Secondary | ICD-10-CM | POA: Diagnosis not present

## 2017-02-20 DIAGNOSIS — R202 Paresthesia of skin: Secondary | ICD-10-CM | POA: Diagnosis not present

## 2017-02-20 DIAGNOSIS — R471 Dysarthria and anarthria: Secondary | ICD-10-CM | POA: Diagnosis not present

## 2017-02-20 DIAGNOSIS — Z1389 Encounter for screening for other disorder: Secondary | ICD-10-CM | POA: Diagnosis not present

## 2017-03-31 ENCOUNTER — Ambulatory Visit (INDEPENDENT_AMBULATORY_CARE_PROVIDER_SITE_OTHER): Payer: BLUE CROSS/BLUE SHIELD | Admitting: Neurology

## 2017-03-31 ENCOUNTER — Encounter: Payer: Self-pay | Admitting: Neurology

## 2017-03-31 VITALS — BP 126/82 | HR 76 | Resp 16 | Ht 66.0 in | Wt 196.0 lb

## 2017-03-31 DIAGNOSIS — R42 Dizziness and giddiness: Secondary | ICD-10-CM | POA: Diagnosis not present

## 2017-03-31 DIAGNOSIS — R269 Unspecified abnormalities of gait and mobility: Secondary | ICD-10-CM | POA: Insufficient documentation

## 2017-03-31 DIAGNOSIS — R200 Anesthesia of skin: Secondary | ICD-10-CM | POA: Insufficient documentation

## 2017-03-31 DIAGNOSIS — R4781 Slurred speech: Secondary | ICD-10-CM | POA: Insufficient documentation

## 2017-03-31 DIAGNOSIS — F419 Anxiety disorder, unspecified: Secondary | ICD-10-CM | POA: Diagnosis not present

## 2017-03-31 DIAGNOSIS — R002 Palpitations: Secondary | ICD-10-CM

## 2017-03-31 DIAGNOSIS — H539 Unspecified visual disturbance: Secondary | ICD-10-CM | POA: Insufficient documentation

## 2017-03-31 MED ORDER — BUSPIRONE HCL 15 MG PO TABS: 15.0000 mg | ORAL_TABLET | Freq: Two times a day (BID) | ORAL | 5 refills | Status: DC

## 2017-03-31 NOTE — Progress Notes (Signed)
[facility]  PATIENT: Lindsey Phelps DOB: 01-14-1981  REFERRING DOCTOR OR PCP:  Elfredia NevinsLawrence Fusco SOURCE: Patient, notes from Dr. Sherwood GamblerFusco, laboratory results.  _________________________________   HISTORICAL  CHIEF COMPLAINT:  Chief Complaint  Patient presents with  . Speech Disturbance    Occasional slurred speech, difficulty with word finding, numbness bilat arms, neck, jaw, dizziness, difficulty with balance, weakness and pain bilat legs onset about 4-6 mos. ago.  Sts. mother has hx. of ? optic neuritis/fim  . Numbness    HISTORY OF PRESENT ILLNESS:  I had the pleasure seeing you patient, Marcelo BaldyBonnie Tsosie, at [facility] for neurologic consultation regarding her numbness, word finding difficulties, balance issues and other neurologic symptoms.  She is a 36 yo woman who had an episode with chest pain and bilateral arm numbness on 01/27/2016 and was taken to the ED (workup was normal).  She who noted numbness in her jaw at night about 5 months ago when starting to fall asleep.  In August 2018, she had another episode of chest tightness and arm numbness and went to an ED.   Since then, she has noted more symptoms such as slurred speech, verbal fluency issues, leg weakness and balance issues.    These symptoms mostly come and go for 10 - 30 minutes and now occur daily.   They occur randomly and in any position (sitting, standing, walking).    She also notes vision is blurry and she has episodes of worsening blurry vision, also lasting 10-30 minutes.   She has vertigo that is mild but constant.   It intensifies for a few minutes at a time sometimes while she is walking.      She has had panic attacks since 2016 after her brother was in a serious accident but she feels current symptoms are different.  She takes valium prn, just a few a month.  Many years ago, she was on Celexa.   She continues to note episodes of palpitations and tachycardia.   Metoprolol was  started with benefit.     I reviewed the laboratory results from 02/21/2017. B12 is low-normal at 289. TSH, CBC and CMP are normal. LDL  and triglycerides were slightly elevated.   01/25/2017 CTA of the chest and CXR were normal.   She had a 30 day event monitor that showed intermittent tachycardia by her reports.  An Echo ws reportedly normal.    REVIEW OF SYSTEMS: Constitutional: No fevers, chills, sweats, or change in appetite.   She has mild sleep onset insomnia Eyes: No visual changes, double vision, eye pain Ear, nose and throat: No hearing loss, ear pain, nasal congestion, sore throat Cardiovascular: No chest pain.  Has intermittent tachycardia.  Respiratory: No shortness of breath at rest or with exertion.   No wheezes GastrointestinaI: No nausea, vomiting, diarrhea, abdominal pain, fecal incontinence Genitourinary: No dysuria, urinary retention or frequency.  No nocturia. Musculoskeletal: No neck pain, back pain Integumentary: No rash, pruritus, skin lesions Neurological: as above Psychiatric: No depression at this time.  H/o panic attacks. Endocrine: No palpitations, diaphoresis, change in appetite, change in weigh or increased thirst Hematologic/Lymphatic: No anemia, purpura, petechiae. Allergic/Immunologic: has some seasonal allergies  ALLERGIES: Allergies  Allergen Reactions  . Adhesive [Tape]     Including bandaids  . Darvocet [Propoxyphene N-Acetaminophen]   . Demerol     HOME MEDICATIONS:  Current Outpatient Prescriptions:  .  cetirizine (ZYRTEC) 10 MG tablet, Take 10 mg by mouth as needed for allergies.,  Disp: , Rfl:  .  diazepam (VALIUM) 5 MG tablet, Take 5 mg by mouth every 6 (six) hours as needed for anxiety., Disp: , Rfl:  .  metoprolol tartrate (LOPRESSOR) 25 MG tablet, Take 1 tablet (25 mg total) by mouth 2 (two) times daily., Disp: 60 tablet, Rfl: 6 .  busPIRone (BUSPAR) 15 MG tablet, Take 1 tablet (15 mg total) by mouth 2 (two) times daily., Disp: 60  tablet, Rfl: 5  PAST MEDICAL HISTORY: Past Medical History:  Diagnosis Date  . Anxiety   . Heart palpitations   . History of IBS     PAST SURGICAL HISTORY: Past Surgical History:  Procedure Laterality Date  . ABDOMINAL HYSTERECTOMY    . BLADDER NECK SUSPENSION    . CHOLECYSTECTOMY    . COLONOSCOPY  01/23/2011   Procedure: COLONOSCOPY;  Surgeon: Malissa Hippo, MD;  Location[facility];  Service: Endoscopy;  Laterality: N/A;  . LAPAROSCOPIC TUBAL LIGATION      FAMILY HISTORY: Family History  Problem Relation Age of Onset  . Diabetes Father   . Hypertension Father   . Colon polyps Father   . Lung cancer Father   . Hypertension Mother   . Coronary artery disease Mother   . COPD Mother     SOCIAL HISTORY:  Social History   Social History  . Marital status: Married    Spouse name: N/A  . Number of children: N/A  . Years of education: N/A   Occupational History  . Not on file.   Social History Main Topics  . Smoking status: Former Smoker    Packs/day: 0.50    Years: 15.00    Types: Cigarettes    Quit date: 09/16/2012  . Smokeless tobacco: Never Used  . Alcohol use 0.6 oz/week    1 Shots of liquor per week     Comment: every few months  . Drug use: No  . Sexual activity: Yes    Birth control/ protection: Surgical   Other Topics Concern  . Not on file   Social History Narrative  . No narrative on file     PHYSICAL EXAM  Vitals:   03/31/17 0922  BP: 126/82  Pulse: 76  Resp: 16  Weight: 196 lb (88.9 kg)  Height: 5\' 6"  (1.676 m)    Body mass index is 31.64 kg/m.   General: The patient is well-developed and well-nourished and in no acute distress  Eyes:  Funduscopic exam shows normal optic discs and retinal vessels.  Neck: The neck is supple, no carotid bruits are noted.  The neck is nontender.  Cardiovascular: The heart has a regular rate and rhythm with a normal S1 and S2. There were no murmurs, gallops or rubs. Lungs are clear to  auscultation.  Skin: Extremities are without significant edema.  Musculoskeletal:  Back is nontender  Neurologic Exam  Mental status: The patient is alert and oriented x 3 at the time of the examination. The patient has apparent normal recent and remote memory, with an apparently normal attention span and concentration ability.   Speech is normal.  Cranial nerves: Extraocular movements are full. Pupils are equal, round, and reactive to light and accomodation.  Visual fields are full.  Facial symmetry is present. There is good facial sensation to soft touch bilaterally.Facial strength is normal.  Trapezius and sternocleidomastoid strength is normal. No dysarthria is noted.  The tongue is midline, and the patient has symmetric elevation of the soft palate. No obvious hearing  deficits are noted.  Motor:  Muscle bulk is normal.   Tone is normal. Strength is  5 / 5 in all 4 extremities.   Sensory: Sensory testing is intact to pinprick, soft touch and vibration sensation in all 4 extremities.  Coordination: Cerebellar testing reveals good finger-nose-finger and heel-to-shin bilaterally.  Gait and station: Station is normal.   Gait is normal. Tandem gait is mildly wide. Romberg is negative.   Reflexes: Deep tendon reflexes are symmetric and 3 bilaterally in arms and knees and 2 at the ankles.  .   Plantar responses are flexor.    DIAGNOSTIC DATA (LABS, IMAGING, TESTING) - I reviewed patient records, labs, notes, testing and imaging myself where available.  Lab Results  Component Value Date   WBC 5.9 01/28/2016   HGB 14.1 01/28/2016   HCT 40.8 01/28/2016   MCV 88.1 01/28/2016   PLT 250 01/28/2016      Component Value Date/Time   NA 136 01/28/2016 1038   K 3.5 01/28/2016 1038   CL 106 01/28/2016 1038   CO2 19 (L) 01/28/2016 1038   GLUCOSE 96 01/28/2016 1038   BUN 16 01/28/2016 1038   CREATININE 0.85 01/28/2016 1038   CALCIUM 9.4 01/28/2016 1038   GFRNONAA >60 01/28/2016 1038    GFRAA >60 01/28/2016 1038       ASSESSMENT AND PLAN  Slurred speech - Plan: MR BRAIN W WO CONTRAST  Numbness  Vertigo  Visual disturbance  Anxiety  Palpitations  Gait disturbance   In summary, Devita Hewatt is a 36 year old woman with multiple transient neurologic symptoms also has a history of anxiety and panic attacks. Due to the nature of her symptoms and the more persistant visual changes and numbness, we need to check an MRI of the brain to determine if there have been demyelinating or ischemic events that could explain her symptoms.   Based on results, additional testing (laboratory or CSF) may be necessary.    She does have some anxiety and I will place her on BuSpar as she did not tolerate an SSRI well in the past.  She will return to see me in 2 months or contact us sooner if there are new or worsening neurologic symptoms.  Thank you for asking me to see Ms. Munos for neurologic consultation. Please let me know if I can be of further assistance with her or other patients in the future.  Richard A. Epimenio Foot, MD, [facility] 03/31/2017, 11:44 AM Certified in Neurology, Clinical Neurophysiology, Sleep Medicine, Pain Medicine and Neuroimaging  [facility] [address], [address] [place], [place] [address] [phone]

## 2017-04-07 ENCOUNTER — Ambulatory Visit: Payer: BLUE CROSS/BLUE SHIELD

## 2017-04-07 ENCOUNTER — Telehealth: Payer: Self-pay | Admitting: Neurology

## 2017-04-07 ENCOUNTER — Encounter: Payer: Self-pay | Admitting: Neurology

## 2017-04-07 DIAGNOSIS — R4781 Slurred speech: Secondary | ICD-10-CM | POA: Diagnosis not present

## 2017-04-07 DIAGNOSIS — I639 Cerebral infarction, unspecified: Secondary | ICD-10-CM | POA: Insufficient documentation

## 2017-04-07 MED ORDER — GADOPENTETATE DIMEGLUMINE 469.01 MG/ML IV SOLN: 18.0000 mL | Freq: Once | INTRAVENOUS | Status: AC | PRN

## 2017-04-07 NOTE — Telephone Encounter (Signed)
Discussed with her that the MRI is consistent with a posterior left frontal lobe stroke that occurred about 2 months ago. Given her age we need to do additional testing.  She is feeling better and feels her speech has improved. Additionally anxiety is much better with BuSpar.  I will order a transesophageal echocardiogram and a carotid Doppler study.  She has a cardiologist, Dr. Darl Householder  She is advised to take aspirin 81 mg daily

## 2017-04-08 ENCOUNTER — Telehealth: Payer: Self-pay | Admitting: Cardiovascular Disease

## 2017-04-08 ENCOUNTER — Telehealth: Payer: Self-pay | Admitting: Neurology

## 2017-04-08 NOTE — Telephone Encounter (Signed)
Spoke with Kendal HymenBonnie and answered questions regarding carotid doppler and echo./fim

## 2017-04-08 NOTE — Telephone Encounter (Signed)
Will forward to provider if pt needs echo prior to appt. Looks like Dr Epimenio Foot mentioned transesophageal echo and carotid US

## 2017-04-08 NOTE — Telephone Encounter (Signed)
Pt is asking for a call back with regard to call from Dr Epimenio Foot about the stroke he stated she had, please call

## 2017-04-08 NOTE — Telephone Encounter (Signed)
Mrs. Lindsey Phelps called stating that she had [facility] Brain with and without contrast [facility]  . She received a call from Dr. Epimenio FootSater stating that she has had a stroke on the side of her brain. It was mentioned that she will need Echo.  Patient was concerned if she would wait until the 19th of November to see Dr. Purvis SheffieldKoneswaran. Please call her cell # [phone]

## 2017-04-08 NOTE — Telephone Encounter (Signed)
It appears he has already ordered a TEE and carotid Dopplers. [facility]. I can see her anytime in the next several weeks.

## 2017-04-08 NOTE — Telephone Encounter (Signed)
Pt says she spoke with Dr Ledon Snare office and doesn't have a preference where TEE or carotid are done. Looks like orders have already been placed. Pt says per Dr Ledon Snare office today they would call her for these appointments. Pt wanted to make sure that Dr Purvis Sheffield would be able to see results as well and pt aware that he would be able to see results.

## 2017-04-08 NOTE — Telephone Encounter (Signed)
Pt called back as requested that both TEE and carotid be done at [facility] in [place]. Will also forward this to Dr Epimenio Foot

## 2017-04-10 ENCOUNTER — Telehealth: Payer: Self-pay | Admitting: Cardiovascular Disease

## 2017-04-10 ENCOUNTER — Telehealth: Payer: Self-pay | Admitting: Neurology

## 2017-04-10 DIAGNOSIS — I6389 Other cerebral infarction: Secondary | ICD-10-CM

## 2017-04-10 NOTE — Telephone Encounter (Signed)
Spoke with Lindsey Phelps--she gets Diazepam rx. from her pcp--needs to call that office regarding this rx.  She verbalized understanding of same/fim

## 2017-04-10 NOTE — Telephone Encounter (Signed)
This note has already been sent to Dr Epimenio Foot with notes to schedule at [facility].

## 2017-04-10 NOTE — Telephone Encounter (Signed)
Patient called asking the status of procedures being ordered at [facility].  Stated that another doctor is ordering them,but you asked her to call if they had not contacted her by today.

## 2017-04-10 NOTE — Telephone Encounter (Signed)
Pt called she is having a hard time sleeping since she got the dx she had a stroke. She is taking diazepam (VALIUM) 5 MG tablet for the past 3 nights, said her mind is racing and won't shut off. She is wanting to know if she could take 10mg  valium at night to sleep, this has not been prescribed by Dr Epimenio Foot, she is wanting to make sure it won't inter with the busPIRone (BUSPAR) 15 MG tablet . Please call to discuss

## 2017-04-13 ENCOUNTER — Other Ambulatory Visit: Payer: Self-pay | Admitting: Neurology

## 2017-04-13 DIAGNOSIS — I639 Cerebral infarction, unspecified: Secondary | ICD-10-CM

## 2017-04-13 NOTE — Telephone Encounter (Signed)
Carotid doppler reordered as requested/fim

## 2017-04-13 NOTE — Telephone Encounter (Signed)
Opened in error

## 2017-04-13 NOTE — Addendum Note (Signed)
Addended by: Candis SchatzMISENHEIMER, Hopelynn Gartland I on: 04/13/2017 12:03 PM   Modules accepted: Orders

## 2017-04-13 NOTE — Telephone Encounter (Signed)
Faith can you re-order Doppler to this order number [id number]

## 2017-04-13 NOTE — Progress Notes (Signed)
Please let her know that I would also like her to have a 30 day event monitor to make sure that she does not have atrial fibrillation which could also cause a stroke.   I place order and she can probably get this in [place] at Dr. Junius Argyle office

## 2017-04-13 NOTE — Telephone Encounter (Signed)
Asking about esophogeal Ultra Sound.  Would like to know who will be contacting her.  Stated she still has not heard from anyone from [facility] to schedule

## 2017-04-14 ENCOUNTER — Telehealth: Payer: Self-pay | Admitting: *Deleted

## 2017-04-14 NOTE — Telephone Encounter (Signed)
Tobe Sos (referrals) from Dr. Bonnita Hollow office (neurology) has called requesting [place] do a 30 day event monitor on this patient.  (Dx - CVA)  This patient has 1 year appointment already scheduled for 04/20/2017 in the [place] office.  Is this okay to order in your name for your review?    Faith - Dr. Bonnita Hollow nurse.

## 2017-04-14 NOTE — Telephone Encounter (Signed)
Patient has been scheduled for her US Carotid Bilateral 04/17/2017 At [facility] . Spoke to [facility] Dr. Darl Householder Nurse at [facility]  She will set up the CARDIAC EVENT MONITOR .and call the patient.

## 2017-04-14 NOTE — Telephone Encounter (Signed)
Pt requesting hypoallergenic stickers for monitor

## 2017-04-14 NOTE — Telephone Encounter (Signed)
Noted.  Will make note when enroll with [employer] for monitor.  Enrolled now.

## 2017-04-14 NOTE — Telephone Encounter (Signed)
That would be fine 

## 2017-04-17 ENCOUNTER — Ambulatory Visit (HOSPITAL_COMMUNITY)
Admission: RE | Admit: 2017-04-17 | Discharge: 2017-04-17 | Disposition: A | Discharge status: Home / Self Care | Payer: BLUE CROSS/BLUE SHIELD | Source: Ambulatory Visit | Attending: Neurology | Admitting: Neurology

## 2017-04-17 DIAGNOSIS — R29818 Other symptoms and signs involving the nervous system: Secondary | ICD-10-CM | POA: Diagnosis not present

## 2017-04-17 DIAGNOSIS — I639 Cerebral infarction, unspecified: Secondary | ICD-10-CM | POA: Insufficient documentation

## 2017-04-20 ENCOUNTER — Encounter: Payer: Self-pay | Admitting: Cardiovascular Disease

## 2017-04-20 ENCOUNTER — Telehealth: Payer: Self-pay | Admitting: *Deleted

## 2017-04-20 ENCOUNTER — Ambulatory Visit (INDEPENDENT_AMBULATORY_CARE_PROVIDER_SITE_OTHER): Payer: BLUE CROSS/BLUE SHIELD | Admitting: Cardiovascular Disease

## 2017-04-20 VITALS — BP 104/70 | HR 80 | Ht 66.0 in | Wt 194.0 lb

## 2017-04-20 DIAGNOSIS — I10 Essential (primary) hypertension: Secondary | ICD-10-CM | POA: Diagnosis not present

## 2017-04-20 DIAGNOSIS — I639 Cerebral infarction, unspecified: Secondary | ICD-10-CM | POA: Diagnosis not present

## 2017-04-20 DIAGNOSIS — R000 Tachycardia, unspecified: Secondary | ICD-10-CM

## 2017-04-20 DIAGNOSIS — F419 Anxiety disorder, unspecified: Secondary | ICD-10-CM

## 2017-04-20 DIAGNOSIS — R002 Palpitations: Secondary | ICD-10-CM

## 2017-04-20 MED ORDER — ROSUVASTATIN CALCIUM 10 MG PO TABS: 10.0000 mg | ORAL_TABLET | Freq: Every day | ORAL | 3 refills | Status: DC

## 2017-04-20 NOTE — Telephone Encounter (Signed)
-----   Message from Asa Lente, MD sent at 04/17/2017  3:29 PM EST ----- Please let her know that the carotid Dopplers did not show any stenosis.

## 2017-04-20 NOTE — Patient Instructions (Signed)
Your physician recommends that you schedule a follow-up appointment in: 3 months with Dr.Koneswaran   Your physician has requested that you have a TEE. During a TEE, sound waves are used to create images of your heart. It provides your doctor with information about the size and shape of your heart and how well your heart's chambers and valves are working. In this test, a transducer is attached to the end of a flexible tube that's guided down your throat and into your esophagus (the tube leading from you mouth to your stomach) to get a more detailed image of your heart. You are not awake for the procedure. Please see the instruction sheet given to you today. For further information please visit [email or web address].     START Crestor 10 mg at dinner     Thank you for choosing [facility] !

## 2017-04-20 NOTE — H&P (View-Only) (Signed)
    SUBJECTIVE: The patient presents for routine follow-up.  She sustained a CVA and her neurologist contacted me requesting a 30-day event monitor to evaluate for atrial fibrillation.  MRI on 04/07/17 demonstrated a focus in the posterior left frontal lobe most consistent with a sub-chronic stroke.  There were some additional ischemic changes noted.  Carotid Dopplers showed no significant carotid atherosclerosis.  She has a history of tachycardia, palpitations, and chest pain.  Her chest pain was deemed noncardiac in etiology and due to dehydration in the past.  She has several questions regarding her stroke and its etiology.  Blood pressure is normal today, 104/70.  The 30-day event monitor which I order has been mailed to her.  Her symptoms of anxiety are much better controlled with BuSpar.  She describes some episodes of left-sided facial numbness associated with shortness of breath when she is drifting off to sleep.  She has had episodic slurred speech, imbalance, dizziness, and headaches.  For these reasons, her neurologist ordered the MRI.  She denies exertional chest pain and exertional dyspnea.  She has had some right sided inframammary pain and tenderness.  Her neurologist apparently ordered a TEE but this has not been scheduled.  She is due to follow-up with him on January 28.  ECG performed in the office today which I ordered and personally interpreted demonstrates normal sinus rhythm with no ischemic ST segment or T-wave abnormalities, nor any arrhythmias.    Soc Hx: Her brother Mark Wyatt is also my patient. Works at [employer] in [place]. Married.    Review of Systems: As per "subjective", otherwise negative.  Allergies  Allergen Reactions  . Adhesive [Tape]     Including bandaids  . Darvocet [Propoxyphene N-Acetaminophen]   . Demerol     Current Outpatient Medications  Medication Sig Dispense Refill  . aspirin EC 81 MG tablet Take 81 mg daily by mouth.      . busPIRone (BUSPAR) 15 MG tablet Take 1 tablet (15 mg total) by mouth 2 (two) times daily. 60 tablet 5  . cetirizine (ZYRTEC) 10 MG tablet Take 10 mg by mouth as needed for allergies.    . diazepam (VALIUM) 5 MG tablet Take 5 mg by mouth every 6 (six) hours as needed for anxiety.    . metoprolol tartrate (LOPRESSOR) 25 MG tablet Take 1 tablet (25 mg total) by mouth 2 (two) times daily. 60 tablet 6   No current facility-administered medications for this visit.    Facility-Administered Medications Ordered in Other Visits  Medication Dose Route Frequency Provider Last Rate Last Dose  . gadopentetate dimeglumine (MAGNEVIST) injection 18 mL  18 mL Intravenous Once PRN Sater, Richard A, MD        Past Medical History:  Diagnosis Date  . Anxiety   . Heart palpitations   . History of IBS     Past Surgical History:  Procedure Laterality Date  . ABDOMINAL HYSTERECTOMY    . BLADDER NECK SUSPENSION    . CHOLECYSTECTOMY    . COLONOSCOPY N/A 01/23/2011   Performed by Rehman, Najeeb U, MD at [facility]  . LAPAROSCOPIC TUBAL LIGATION      Social History   Socioeconomic History  . Marital status: Married    Spouse name: Not on file  . Number of children: Not on file  . Years of education: Not on file  . Highest education level: Not on file  Social Needs  . Financial resource strain: Not on file  .   Food insecurity - worry: Not on file  . Food insecurity - inability: Not on file  . Transportation needs - medical: Not on file  . Transportation needs - non-medical: Not on file  Occupational History  . Not on file  Tobacco Use  . Smoking status: Former Smoker    Packs/day: 0.50    Years: 15.00    Pack years: 7.50    Types: Cigarettes    Last attempt to quit: 09/16/2012    Years since quitting: 4.5  . Smokeless tobacco: Never Used  Substance and Sexual Activity  . Alcohol use: Yes    Alcohol/week: 0.6 oz    Types: 1 Shots of liquor per week    Comment: every few months  . Drug  use: No  . Sexual activity: Yes    Birth control/protection: Surgical  Other Topics Concern  . Not on file  Social History Narrative  . Not on file     Vitals:   04/20/17 1115  BP: 104/70  Pulse: 80  SpO2: 98%  Weight: 194 lb (88 kg)  Height: 5\' 6"  (1.676 m)    Wt Readings from Last 3 Encounters:  04/20/17 194 lb (88 kg)  03/31/17 196 lb (88.9 kg)  05/13/16 191 lb (86.6 kg)     PHYSICAL EXAM General: NAD HEENT: Normal. Neck: No JVD, no thyromegaly. Lungs: Clear to auscultation bilaterally with normal respiratory effort. CV: Regular rate and rhythm, normal S1/S2, no S3/S4, no murmur. No pretibial or periankle edema.  No carotid bruit.   Abdomen: Soft, nontender, no distention.  Neurologic: Alert and oriented.  Psych: Normal affect. Skin: Normal. Musculoskeletal: No gross deformities.    ECG: Most recent ECG reviewed.   Labs: Lab Results  Component Value Date/Time   K 3.5 01/28/2016 10:38 AM   BUN 16 01/28/2016 10:38 AM   CREATININE 0.85 01/28/2016 10:38 AM   HGB 14.1 01/28/2016 10:38 AM     Lipids: No results found for: LDLCALC, LDLDIRECT, CHOL, TRIG, HDL     ASSESSMENT AND PLAN: 1.  Sub-chronic CVA: A 30-day event monitor has been ordered to evaluate for atrial fibrillation.  She is currently on metoprolol 25 mg twice daily.  I will start Crestor 10 mg daily for its pleiotropic effects.  She has now recently been on aspirin 81 mg.  I will also schedule a transesophageal echocardiogram with me at [facility].  2.  Chronic hypertension: Controlled.  No changes to therapy.  3.  Tachycardia and palpitations: She is symptomatically stable with respect to palpitations on metoprolol.  Again, a 30-day event monitor has been ordered to evaluate for atrial fibrillation given her recent CVA.    Disposition: Follow up 3 months.  Time spent: 40 minutes, of which greater than 50% was spent reviewing symptoms, relevant blood tests and studies, and  discussing management plan with the patient.    Prentice Docker, M.D., F.A.C.C.

## 2017-04-20 NOTE — Telephone Encounter (Signed)
Spoke with Kendal HymenBonnie and explained carotid duplex showed no stenosis.  She verbalized understanding of same/fim

## 2017-04-20 NOTE — Progress Notes (Signed)
SUBJECTIVE: The patient presents for routine follow-up.  She sustained a CVA and her neurologist contacted me requesting a 30-day event monitor to evaluate for atrial fibrillation.  MRI on 04/07/17 demonstrated a focus in the posterior left frontal lobe most consistent with a sub-chronic stroke.  There were some additional ischemic changes noted.  Carotid Dopplers showed no significant carotid atherosclerosis.  She has a history of tachycardia, palpitations, and chest pain.  Her chest pain was deemed noncardiac in etiology and due to dehydration in the past.  She has several questions regarding her stroke and its etiology.  Blood pressure is normal today, 104/70.  The 30-day event monitor which I order has been mailed to her.  Her symptoms of anxiety are much better controlled with BuSpar.  She describes some episodes of left-sided facial numbness associated with shortness of breath when she is drifting off to sleep.  She has had episodic slurred speech, imbalance, dizziness, and headaches.  For these reasons, her neurologist ordered the MRI.  She denies exertional chest pain and exertional dyspnea.  She has had some right sided inframammary pain and tenderness.  Her neurologist apparently ordered a TEE but this has not been scheduled.  She is due to follow-up with him on January 28.  ECG performed in the office today which I ordered and personally interpreted demonstrates normal sinus rhythm with no ischemic ST segment or T-wave abnormalities, nor any arrhythmias.    Soc Hx: Her brother Mitchell HeirMark Wyatt is also my patient. Works at [employer] in [place]. Married.    Review of Systems: As per "subjective", otherwise negative.  Allergies  Allergen Reactions  . Adhesive [Tape]     Including bandaids  . Darvocet [Propoxyphene N-Acetaminophen]   . Demerol     Current Outpatient Medications  Medication Sig Dispense Refill  . aspirin EC 81 MG tablet Take 81 mg daily by mouth.      . busPIRone (BUSPAR) 15 MG tablet Take 1 tablet (15 mg total) by mouth 2 (two) times daily. 60 tablet 5  . cetirizine (ZYRTEC) 10 MG tablet Take 10 mg by mouth as needed for allergies.    . diazepam (VALIUM) 5 MG tablet Take 5 mg by mouth every 6 (six) hours as needed for anxiety.    . metoprolol tartrate (LOPRESSOR) 25 MG tablet Take 1 tablet (25 mg total) by mouth 2 (two) times daily. 60 tablet 6   No current facility-administered medications for this visit.    Facility-Administered Medications Ordered in Other Visits  Medication Dose Route Frequency Provider Last Rate Last Dose  . gadopentetate dimeglumine (MAGNEVIST) injection 18 mL  18 mL Intravenous Once PRN Sater, Pearletha Furlichard A, MD        Past Medical History:  Diagnosis Date  . Anxiety   . Heart palpitations   . History of IBS     Past Surgical History:  Procedure Laterality Date  . ABDOMINAL HYSTERECTOMY    . BLADDER NECK SUSPENSION    . CHOLECYSTECTOMY    . COLONOSCOPY N/A 01/23/2011   Performed by Malissa Hippoehman, Najeeb U, MD at [facility]  . LAPAROSCOPIC TUBAL LIGATION      Social History   Socioeconomic History  . Marital status: Married    Spouse name: Not on file  . Number of children: Not on file  . Years of education: Not on file  . Highest education level: Not on file  Social Needs  . Financial resource strain: Not on file  .  Food insecurity - worry: Not on file  . Food insecurity - inability: Not on file  . Transportation needs - medical: Not on file  . Transportation needs - non-medical: Not on file  Occupational History  . Not on file  Tobacco Use  . Smoking status: Former Smoker    Packs/day: 0.50    Years: 15.00    Pack years: 7.50    Types: Cigarettes    Last attempt to quit: 09/16/2012    Years since quitting: 4.5  . Smokeless tobacco: Never Used  Substance and Sexual Activity  . Alcohol use: Yes    Alcohol/week: 0.6 oz    Types: 1 Shots of liquor per week    Comment: every few months  . Drug  use: No  . Sexual activity: Yes    Birth control/protection: Surgical  Other Topics Concern  . Not on file  Social History Narrative  . Not on file     Vitals:   04/20/17 1115  BP: 104/70  Pulse: 80  SpO2: 98%  Weight: 194 lb (88 kg)  Height: 5\' 6"  (1.676 m)    Wt Readings from Last 3 Encounters:  04/20/17 194 lb (88 kg)  03/31/17 196 lb (88.9 kg)  05/13/16 191 lb (86.6 kg)     PHYSICAL EXAM General: NAD HEENT: Normal. Neck: No JVD, no thyromegaly. Lungs: Clear to auscultation bilaterally with normal respiratory effort. CV: Regular rate and rhythm, normal S1/S2, no S3/S4, no murmur. No pretibial or periankle edema.  No carotid bruit.   Abdomen: Soft, nontender, no distention.  Neurologic: Alert and oriented.  Psych: Normal affect. Skin: Normal. Musculoskeletal: No gross deformities.    ECG: Most recent ECG reviewed.   Labs: Lab Results  Component Value Date/Time   K 3.5 01/28/2016 10:38 AM   BUN 16 01/28/2016 10:38 AM   CREATININE 0.85 01/28/2016 10:38 AM   HGB 14.1 01/28/2016 10:38 AM     Lipids: No results found for: LDLCALC, LDLDIRECT, CHOL, TRIG, HDL     ASSESSMENT AND PLAN: 1.  Sub-chronic CVA: A 30-day event monitor has been ordered to evaluate for atrial fibrillation.  She is currently on metoprolol 25 mg twice daily.  I will start Crestor 10 mg daily for its pleiotropic effects.  She has now recently been on aspirin 81 mg.  I will also schedule a transesophageal echocardiogram with me at [facility].  2.  Chronic hypertension: Controlled.  No changes to therapy.  3.  Tachycardia and palpitations: She is symptomatically stable with respect to palpitations on metoprolol.  Again, a 30-day event monitor has been ordered to evaluate for atrial fibrillation given her recent CVA.    Disposition: Follow up 3 months.  Time spent: 40 minutes, of which greater than 50% was spent reviewing symptoms, relevant blood tests and studies, and  discussing management plan with the patient.    Prentice Docker, M.D., F.A.C.C.

## 2017-04-21 ENCOUNTER — Telehealth: Payer: Self-pay

## 2017-04-21 ENCOUNTER — Other Ambulatory Visit: Payer: Self-pay | Admitting: Cardiovascular Disease

## 2017-04-21 NOTE — Telephone Encounter (Signed)
Patient to arrive at 0830 hrs for TEE on 05/05/17 at 1000 hrs with Dr.Koneswaran, to go to Short Stay  She understands to be NPO after midnight the day before, may take meds with a sip of water,must have someone over 18 with her.Her spouse will be there.

## 2017-04-27 ENCOUNTER — Ambulatory Visit (INDEPENDENT_AMBULATORY_CARE_PROVIDER_SITE_OTHER): Payer: BLUE CROSS/BLUE SHIELD

## 2017-04-27 ENCOUNTER — Other Ambulatory Visit: Payer: Self-pay | Admitting: Cardiovascular Disease

## 2017-04-27 DIAGNOSIS — I639 Cerebral infarction, unspecified: Secondary | ICD-10-CM | POA: Diagnosis not present

## 2017-04-27 DIAGNOSIS — J329 Chronic sinusitis, unspecified: Secondary | ICD-10-CM | POA: Diagnosis not present

## 2017-04-27 DIAGNOSIS — Z6831 Body mass index (BMI) 31.0-31.9, adult: Secondary | ICD-10-CM | POA: Diagnosis not present

## 2017-04-27 DIAGNOSIS — R197 Diarrhea, unspecified: Secondary | ICD-10-CM | POA: Diagnosis not present

## 2017-04-27 DIAGNOSIS — E6609 Other obesity due to excess calories: Secondary | ICD-10-CM | POA: Diagnosis not present

## 2017-05-04 ENCOUNTER — Telehealth: Payer: Self-pay | Admitting: Cardiovascular Disease

## 2017-05-04 NOTE — Telephone Encounter (Signed)
Informed patient that she may take all medications the morning of her procedure.  She questioned it due to them running tube down throat.  Reiterated that it is not necessary for her to hold any medications, but if she feels more comfortable holding them, that is okay as well.  She verbalized understanding.

## 2017-05-04 NOTE — Telephone Encounter (Signed)
Patient is scheduled for TEE 05-05-17. She called wanting to know what she is suppose to do about taking her morning medications.

## 2017-05-05 ENCOUNTER — Encounter (HOSPITAL_COMMUNITY): Payer: Self-pay | Admitting: *Deleted

## 2017-05-05 ENCOUNTER — Encounter (HOSPITAL_COMMUNITY)
Admission: RE | Disposition: A | Discharge status: Home / Self Care | Payer: Self-pay | Source: Ambulatory Visit | Attending: Cardiovascular Disease

## 2017-05-05 ENCOUNTER — Other Ambulatory Visit: Payer: Self-pay

## 2017-05-05 ENCOUNTER — Ambulatory Visit (HOSPITAL_COMMUNITY)
Admission: RE | Admit: 2017-05-05 | Discharge: 2017-05-05 | Disposition: A | Discharge status: Home / Self Care | Payer: BLUE CROSS/BLUE SHIELD | Source: Ambulatory Visit | Attending: Cardiovascular Disease | Admitting: Cardiovascular Disease

## 2017-05-05 ENCOUNTER — Ambulatory Visit (HOSPITAL_BASED_OUTPATIENT_CLINIC_OR_DEPARTMENT_OTHER): Payer: BLUE CROSS/BLUE SHIELD

## 2017-05-05 DIAGNOSIS — Z87891 Personal history of nicotine dependence: Secondary | ICD-10-CM | POA: Insufficient documentation

## 2017-05-05 DIAGNOSIS — Z8673 Personal history of transient ischemic attack (TIA), and cerebral infarction without residual deficits: Secondary | ICD-10-CM | POA: Diagnosis not present

## 2017-05-05 DIAGNOSIS — Z7982 Long term (current) use of aspirin: Secondary | ICD-10-CM | POA: Insufficient documentation

## 2017-05-05 DIAGNOSIS — Z79899 Other long term (current) drug therapy: Secondary | ICD-10-CM | POA: Insufficient documentation

## 2017-05-05 DIAGNOSIS — R000 Tachycardia, unspecified: Secondary | ICD-10-CM | POA: Diagnosis not present

## 2017-05-05 DIAGNOSIS — I10 Essential (primary) hypertension: Secondary | ICD-10-CM | POA: Insufficient documentation

## 2017-05-05 DIAGNOSIS — I6389 Other cerebral infarction: Secondary | ICD-10-CM | POA: Diagnosis not present

## 2017-05-05 DIAGNOSIS — R002 Palpitations: Secondary | ICD-10-CM | POA: Insufficient documentation

## 2017-05-05 HISTORY — DX: Reserved for inherently not codable concepts without codable children: IMO0001

## 2017-05-05 HISTORY — PX: TEE WITHOUT CARDIOVERSION: SHX5443

## 2017-05-05 HISTORY — DX: Cerebral infarction, unspecified: I63.9

## 2017-05-05 SURGERY — ECHOCARDIOGRAM, TRANSESOPHAGEAL
Anesthesia: Moderate Sedation

## 2017-05-05 MED ORDER — BUTAMBEN-TETRACAINE-BENZOCAINE 2-2-14 % EX AERO
INHALATION_SPRAY | CUTANEOUS | Status: AC
  Filled 2017-05-05: qty 5

## 2017-05-05 MED ORDER — MIDAZOLAM HCL 5 MG/5ML IJ SOLN
INTRAMUSCULAR | Status: AC
  Filled 2017-05-05: qty 5

## 2017-05-05 MED ORDER — FENTANYL CITRATE (PF) 100 MCG/2ML IJ SOLN
INTRAMUSCULAR | Status: AC
  Filled 2017-05-05: qty 4

## 2017-05-05 MED ORDER — LIDOCAINE VISCOUS 2 % MT SOLN
OROMUCOSAL | Status: AC
  Filled 2017-05-05: qty 15

## 2017-05-05 MED ORDER — MIDAZOLAM HCL 5 MG/5ML IJ SOLN
INTRAMUSCULAR | Status: AC
  Filled 2017-05-05: qty 10

## 2017-05-05 MED ORDER — SODIUM CHLORIDE 0.9 % IV SOLN
INTRAVENOUS | Status: DC
  Administered 2017-05-05: 09:00:00 via INTRAVENOUS

## 2017-05-05 MED ORDER — MIDAZOLAM HCL 5 MG/5ML IJ SOLN
INTRAMUSCULAR | Status: DC | PRN
  Administered 2017-05-05 (×8): 2 mg via INTRAVENOUS

## 2017-05-05 MED ORDER — SODIUM CHLORIDE 0.9% FLUSH
INTRAVENOUS | Status: AC
  Filled 2017-05-05: qty 10

## 2017-05-05 MED ORDER — SODIUM CHLORIDE BACTERIOSTATIC 0.9 % IJ SOLN
INTRAMUSCULAR | Status: AC
  Filled 2017-05-05: qty 20

## 2017-05-05 MED ORDER — FENTANYL CITRATE (PF) 100 MCG/2ML IJ SOLN
INTRAMUSCULAR | Status: DC | PRN
  Administered 2017-05-05 (×4): 25 ug via INTRAVENOUS
  Administered 2017-05-05: 50 ug via INTRAVENOUS

## 2017-05-05 NOTE — Discharge Instructions (Signed)
Lindsey Phelps Lindsey echocardiography (TEE) is a special type of test that produces images of the heart by using sound waves (Phelps). This type of echocardiography can obtain better images of the heart than standard echocardiography. TEE is done by passing a flexible tube down the esophagus. The heart is located in front of the esophagus. Because the heart and esophagus are close to one another, your health care provider can take very clear, detailed pictures of the heart via ultrasound waves. TEE may be done:  If your health care provider needs more information based on standard echocardiography findings.  If you had a stroke. This might have happened because a clot formed in your heart. TEE can visualize different areas of the heart and check for clots.  To check valve anatomy and function.  To check for infection on the inside of your heart (endocarditis).  To evaluate the dividing wall (septum) of the heart and presence of a hole that did not close after birth (patent foramen ovale or atrial septal defect).  To help diagnose a tear in the wall of the aorta (aortic dissection).  During cardiac valve surgery. This allows the surgeon to assess the valve repair before closing the chest.  During a variety of other cardiac procedures to guide positioning of catheters.  Sometimes before a cardioversion, which is a shock to convert heart rhythm back to normal.  Tell a health care provider about:  Any allergies you have.  All medicines you are taking, including vitamins, herbs, eye drops, creams, and over-the-counter medicines.  Any problems you or family members have had with anesthetic medicines.  Any blood disorders you have.  Any surgeries you have had.  Any medical conditions you have.  Swallowing difficulties.  An esophageal obstruction. What are the risks? Generally, TEE is a safe procedure. However, as with any procedure, complications can  occur. Possible complications include an esophageal tear (rupture). What happens before the procedure?  Do not eat or drink for 6 hours before the procedure or as directed by your health care provider.  Arrange for someone to drive you home after the procedure. Do not drive yourself home. During the procedure, you will be given medicines that can continue to make you feel drowsy and can impair your reflexes.  An IV access tube will be started in the arm. What happens during the procedure?  A medicine to help you relax (sedative) will be given through the IV access tube.  A medicine may be sprayed or gargled to numb the back of the throat.  Your blood pressure, heart rate, and breathing (vital signs) will be monitored during the procedure.  The TEE probe is a long, flexible tube. The tip of the probe is placed into the back of the mouth, and you will be asked to swallow. This helps to pass the tip of the probe into the esophagus. Once the tip of the probe is in the correct area, your health care provider can take pictures of the heart.  TEE is usually not a painful procedure. You may feel the probe press against the back of the throat. The probe does not enter the trachea and does not affect your breathing. What happens after the procedure?  You will be in bed, resting, until you have fully returned to consciousness.  When you first awaken, your throat may feel slightly sore and will probably still feel numb. This will improve slowly over time.  You will not be allowed to eat or drink until  it is clear that the numbness has improved.  Once you have been able to drink, urinate, and sit on the edge of the bed without feeling sick to your stomach (nausea) or dizzy, you may be cleared to go home.  You should have a friend or family member with you for the next 24 hours after your procedure. This information is not intended to replace advice given to you by your health care provider. Make  sure you discuss any questions you have with your health care provider. Document Released: 08/09/2002 Document Revised: 10/25/2015 Document Reviewed: 11/18/2012 Elsevier Interactive Patient Education  2018 Elsevier Inc.   Lindsey Phelps Lindsey echocardiography (TEE) is a picture test of your heart using sound waves. The pictures taken can give very detailed pictures of your heart. This can help your doctor see if there are problems with your heart. TEE can check:  If your heart has blood clots in it.  How well your heart valves are working.  If you have an infection on the inside of your heart.  Some of the major arteries of your heart.  If your heart valve is working after a Psychologist, forensic.  Your heart before a procedure that uses a shock to your heart to get the rhythm back to normal.  What happens before the procedure?  Do not eat or drink for 6 hours before the procedure or as told by your doctor.  Make plans to have someone drive you home after the procedure. Do not drive yourself home.  An IV tube will be put in your arm. What happens during the procedure?  You will be given a medicine to help you relax (sedative). It will be given through the IV tube.  A numbing medicine will be sprayed or gargled in the back of your throat to help numb it.  The tip of the probe is placed into the back of your mouth. You will be asked to swallow. This helps to pass the probe into your esophagus.  Once the tip of the probe is in the right place, your doctor can take pictures of your heart.  You may feel pressure at the back of your throat. What happens after the procedure?  You will be taken to a recovery area so the sedative can wear off.  Your throat may be sore and scratchy. This will go away slowly over time.  You will go home when you are fully awake and able to swallow liquids.  You should have someone stay with you for the next 24 hours.  Do not drive or  operate machinery for the next 24 hours. This information is not intended to replace advice given to you by your health care provider. Make sure you discuss any questions you have with your health care provider.

## 2017-05-05 NOTE — Interval H&P Note (Signed)
History and Physical Interval Note: No interval changes. Will proceed with TEE as planned to evaluate for a cardiac embolic source as an etiology for CVA.  05/05/2017 8:37 AM  Donnie AhoBonnie W Perrault  has presented today for surgery, with the diagnosis of CVA  The various methods of treatment have been discussed with the patient and family. After consideration of risks, benefits and other options for treatment, the patient has consented to  Procedure(s): TRANSESOPHAGEAL ECHOCARDIOGRAM (TEE) (N/A) as a surgical intervention .  The patient's history has been reviewed, patient examined, no change in status, stable for surgery.  I have reviewed the patient's chart and labs.  Questions were answered to the patient's satisfaction.     Prentice DockerSuresh Claudell Wohler

## 2017-05-05 NOTE — CV Procedure (Signed)
Transesophageal Echocardiogram: Indication: CVA Sedation: Versed: 16 mg, Fentanyl: 150 mcg  Procedure:  The patient was moderately sedated with the above doses of versed and fentanyl.    During this procedure the patient is administered a total of Versed 16 mg and Fentanyl 150 mcg to achieve and maintain moderate conscious sedation.  The patient's heart rate, blood pressure, and oxygen saturation are monitored continuously during the procedure. The period of conscious sedation is 60 minutes, of which I was present face-to-face 100% of this time.  Using digital technique an omniplane probe was advanced into the distal esophagus without incident.   Transgastric imaging revealed normal LV function with no RWMA;s and no mural apical thrombus.  Estimated ejection fraction was 60-65%.  Right sided cardiac chambers were normal with no evidence of pulmonary hypertension.  The pulmonary and tricuspid valves were structurally normal.  There was no TR The mitral valve was structurally normal with no mitral regurgitation.   The aortic valve was trileaflet with no stenosis or regurgitation. The aortic root was normal.    Imaging of the septum showed no ASD or VSD Bubble study was negative for shunt 2D and color flow confirmed no PFO  The LAE was well visualized in orthogonal views.  There was no spontaneous contrast and no thrombus.    The descending thoracic aorta had no mural aortic debris with no evidence of aneurysmal dilation or disection  Impression:  1) Normal TEE with normal LV systolic function, LVEF 60-65%. 2) No valvular pathology. 3) No intracardiac thrombus. 4) No PFO.   Prentice DockerSuresh Koneswaran 05/05/2017 11:12 AM

## 2017-05-05 NOTE — Progress Notes (Signed)
*  PRELIMINARY RESULTS* Echocardiogram Echocardiogram Transesophageal has been performed.  Jeryl Columbia 05/05/2017, 11:04 AM

## 2017-05-05 NOTE — Progress Notes (Addendum)
Spoke to Dr. Purvis SheffieldKoneswaran about patient taking medications. After reviewing blood pressures and heart rate, patient can begin medications this evening per Dr. Purvis SheffieldKoneswaran. Patient's pulse was 60 before she left with blood pressure at 102/62. VSS. No further orders at this time. Patient and husband verbalized understanding of starting medications back with evening dose.

## 2017-05-06 ENCOUNTER — Encounter (HOSPITAL_COMMUNITY): Payer: Self-pay | Admitting: Cardiovascular Disease

## 2017-05-07 NOTE — Telephone Encounter (Signed)
Spoke with Kendal HymenBonnie and answered questions regarding testing she has had so far, difference  b/t hemorrhagic and ischemic cva, etc.  She would like a sooner appt. with RAS to discuss results in greater detail.  I will call her if one becomes available/fim

## 2017-05-07 NOTE — Telephone Encounter (Addendum)
Pt called she had TEE at [facility] Tuesday and cardiologist said there were no blockages, heart looked good. He advised she see her neurologist sooner than 1/28. The cardiologist told her husband and mother it could be vasculitis. She is still having pain in the legs, daily HA's, daily dizziness, weakness, off balance, some slurred speech. Please call to advise at [phone]

## 2017-05-14 ENCOUNTER — Telehealth: Payer: Self-pay | Admitting: Cardiovascular Disease

## 2017-05-14 NOTE — Telephone Encounter (Signed)
Patient states she cannot wear the monitor it is ripping her skin. Patient sending monitor back

## 2017-05-14 NOTE — Telephone Encounter (Signed)
Patient called stating that the heart monitor she is wearing has irritated her skin. She has not worn it in 2 weeks now.

## 2017-05-15 NOTE — Telephone Encounter (Addendum)
Strips that are available in [employer] up to this point shows sinus rhythm.  Will await end of service.  Follow up scheduled for 07/21/2017 in [place] with Dr. Purvis Sheffield.  (may have been worn approximately 2 weeks)

## 2017-06-03 ENCOUNTER — Telehealth: Payer: Self-pay | Admitting: *Deleted

## 2017-06-03 NOTE — Telephone Encounter (Signed)
-----   Message from Asa Lente, MD sent at 06/02/2017  4:13 PM EST ----- Please let her know that the cardiac event monitor was normal.

## 2017-06-03 NOTE — Telephone Encounter (Signed)
I have spoken with Lindsey Phelps this afternoon and advised her of nml cardiac event monitor. She verbalized understanding of same/fim

## 2017-06-11 ENCOUNTER — Encounter: Payer: Self-pay | Admitting: Neurology

## 2017-06-11 ENCOUNTER — Other Ambulatory Visit: Payer: Self-pay

## 2017-06-11 ENCOUNTER — Ambulatory Visit: Payer: BLUE CROSS/BLUE SHIELD | Admitting: Neurology

## 2017-06-11 ENCOUNTER — Telehealth: Payer: Self-pay | Admitting: Neurology

## 2017-06-11 VITALS — BP 110/72 | HR 57 | Resp 14 | Ht 66.0 in | Wt 197.5 lb

## 2017-06-11 DIAGNOSIS — I639 Cerebral infarction, unspecified: Secondary | ICD-10-CM

## 2017-06-11 DIAGNOSIS — R269 Unspecified abnormalities of gait and mobility: Secondary | ICD-10-CM | POA: Diagnosis not present

## 2017-06-11 DIAGNOSIS — F419 Anxiety disorder, unspecified: Secondary | ICD-10-CM | POA: Diagnosis not present

## 2017-06-11 DIAGNOSIS — R4782 Fluency disorder in conditions classified elsewhere: Secondary | ICD-10-CM

## 2017-06-11 DIAGNOSIS — R479 Unspecified speech disturbances: Secondary | ICD-10-CM | POA: Insufficient documentation

## 2017-06-11 NOTE — Progress Notes (Signed)
[facility]  PATIENT: Lindsey Phelps DOB: 1981-04-01  REFERRING DOCTOR OR PCP:  Elfredia Nevins SOURCE: Patient, notes from Dr. Sherwood Gambler, laboratory results.  _________________________________   HISTORICAL  CHIEF COMPLAINT:  Chief Complaint  Patient presents with  . Cerebrovascular Accident    Sts. speech is still slurred at times, but denies new sx.  Contines baby asa, BP and cholesterol management.  Believes Buspar was contributing to h/a's, so she decreasd to 1/3 tab bid/fim    HISTORY OF PRESENT ILLNESS:  Lindsey Phelps is a 37 year old woman with numbness, word finding difficulties, balance issues and other neurologic symptoms.  Update 06/11/2017: I first saw her 03/31/2017.   Was concerned about her word finding difficulties and an MRI of the brain was performed. It showed a subacute left frontal stroke. There were a few other punctate T2/FLAIR hyperintense foci consistent with minimal chronic microvascular ischemic change.   I was most concerned about an embolic etiology. The carotid ultrasound was essentially normal. The cardiac event monitor was essentially normal.  A TEE showed a normal ejection fraction of 60-65 percent.   There was no evidence of a patent foramen ovale or other right-to-left shunt. The study was felt to be normal.  She is on aspirin, metoprolol and Crestor.  Here to the previous visit, she feels her speech is not much  Better abd she still has some aphasia and feels she needs to concentrate more with her speech.  She feels her right arm is still clumsy.   She has noted she spells words wrong a lot and her typing is slowed with more errors.   She still has some slurring. There are no new symptoms.l;  She also has some anxiety and BuSpar was started.   It helped some but may have contributed to dizziness and headaches so the dose was decreased to 5 mg twice a day.    Headaches are less frequent in general (she was having more at last visit).    She will get occasional sharp pains.    She used to smoke but none in 4 years (quit after dad had lung cancer). No OCP (had hysterectomy)  She has FH of strokes and MI's .    From 03/31/2017: I had the pleasure seeing you patient, Lindsey Phelps, at [facility] for neurologic consultation regarding her numbness, word finding difficulties, balance issues and other neurologic symptoms.  She is a 37 yo woman who had an episode with chest pain and bilateral arm numbness on 01/27/2016 and was taken to the ED (workup was normal).  She who noted numbness in her jaw at night about 5 months ago when starting to fall asleep.  In August 2018, she had another episode of chest tightness and arm numbness and went to an ED.   Since then, she has noted more symptoms such as slurred speech, verbal fluency issues, leg weakness and balance issues.    These symptoms mostly come and go for 10 - 30 minutes and now occur daily.   They occur randomly and in any position (sitting, standing, walking).    She also notes vision is blurry and she has episodes of worsening blurry vision, also lasting 10-30 minutes.   She has vertigo that is mild but constant.   It intensifies for a few minutes at a time sometimes while she is walking.      She has had panic attacks since 2016 after her brother was in a serious accident but she  feels current symptoms are different.  She takes valium prn, just a few a month.  Many years ago, she was on Celexa.   She continues to note episodes of palpitations and tachycardia.   Metoprolol was started with benefit.     I reviewed the laboratory results from 02/21/2017. B12 is low-normal at 289. TSH, CBC and CMP are normal. LDL  and triglycerides were slightly elevated.   01/25/2017 CTA of the chest and CXR were normal.   She had a 30 day event monitor that showed intermittent tachycardia by her reports.  An Echo ws reportedly normal.    REVIEW OF SYSTEMS: Constitutional: No fevers,  chills, sweats, or change in appetite.   She has mild sleep onset insomnia Eyes: No visual changes, double vision, eye pain Ear, nose and throat: No hearing loss, ear pain, nasal congestion, sore throat Cardiovascular: No chest pain.  Has intermittent tachycardia.  Respiratory: No shortness of breath at rest or with exertion.   No wheezes GastrointestinaI: No nausea, vomiting, diarrhea, abdominal pain, fecal incontinence Genitourinary: No dysuria, urinary retention or frequency.  No nocturia. Musculoskeletal: No neck pain, back pain Integumentary: No rash, pruritus, skin lesions Neurological: as above Psychiatric: No depression at this time.  H/o panic attacks. Endocrine: No palpitations, diaphoresis, change in appetite, change in weigh or increased thirst Hematologic/Lymphatic: No anemia, purpura, petechiae. Allergic/Immunologic: has some seasonal allergies  ALLERGIES: Allergies  Allergen Reactions  . Adhesive [Tape]     Including bandaids  . Darvocet [Propoxyphene N-Acetaminophen]   . Demerol     HOME MEDICATIONS:  Current Outpatient Medications:  .  aspirin EC 81 MG tablet, Take 81 mg daily by mouth., Disp: , Rfl:  .  busPIRone (BUSPAR) 15 MG tablet, Take 1 tablet (15 mg total) by mouth 2 (two) times daily., Disp: 60 tablet, Rfl: 5 .  diazepam (VALIUM) 10 MG tablet, Take 5 mg by mouth at bedtime as needed for anxiety. , Disp: , Rfl:  .  metoprolol tartrate (LOPRESSOR) 25 MG tablet, TAKE ONE TABLET BY MOUTH TWICE DAILY, Disp: 60 tablet, Rfl: 6 .  rosuvastatin (CRESTOR) 10 MG tablet, Take 1 tablet (10 mg total) daily by mouth., Disp: 90 tablet, Rfl: 3 No current facility-administered medications for this visit.   Facility-Administered Medications Ordered in Other Visits:  .  gadopentetate dimeglumine (MAGNEVIST) injection 18 mL, 18 mL, Intravenous, Once PRN, Sater, Pearletha Furl, MD  PAST MEDICAL HISTORY: Past Medical History:  Diagnosis Date  . Anxiety   . Cold   . Heart  palpitations   . History of IBS   . Stroke [facility])     PAST SURGICAL HISTORY: Past Surgical History:  Procedure Laterality Date  . ABDOMINAL HYSTERECTOMY    . BLADDER NECK SUSPENSION    . CHOLECYSTECTOMY    . COLONOSCOPY  01/23/2011   Procedure: COLONOSCOPY;  Surgeon: Malissa Hippo, MD;  Location: [facility];  Service: Endoscopy;  Laterality: N/A;  . LAPAROSCOPIC TUBAL LIGATION    . TEE WITHOUT CARDIOVERSION N/A 05/05/2017   Procedure: TRANSESOPHAGEAL ECHOCARDIOGRAM (TEE);  Surgeon: Laqueta Linden, MD;  Location: [facility];  Service: Cardiovascular;  Laterality: N/A;    FAMILY HISTORY: Family History  Problem Relation Age of Onset  . Diabetes Father   . Hypertension Father   . Colon polyps Father   . Lung cancer Father   . Hypertension Mother   . Coronary artery disease Mother   . COPD Mother     SOCIAL HISTORY:  Social  History   Socioeconomic History  . Marital status: Married    Spouse name: Not on file  . Number of children: Not on file  . Years of education: Not on file  . Highest education level: Not on file  Social Needs  . Financial resource strain: Not on file  . Food insecurity - worry: Not on file  . Food insecurity - inability: Not on file  . Transportation needs - medical: Not on file  . Transportation needs - non-medical: Not on file  Occupational History  . Not on file  Tobacco Use  . Smoking status: Former Smoker    Packs/day: 0.50    Years: 15.00    Pack years: 7.50    Types: Cigarettes    Last attempt to quit: 09/16/2012    Years since quitting: 4.7  . Smokeless tobacco: Never Used  Substance and Sexual Activity  . Alcohol use: Yes    Alcohol/week: 0.6 oz    Types: 1 Shots of liquor per week    Comment: every few months  . Drug use: No  . Sexual activity: Yes    Birth control/protection: Surgical  Other Topics Concern  . Not on file  Social History Narrative  . Not on file     PHYSICAL EXAM  Vitals:   06/11/17 1018    BP: 110/72  Pulse: (!) 57  Resp: 14  Weight: 197 lb 8 oz (89.6 kg)  Height: 5\' 6"  (1.676 m)    Body mass index is 31.88 kg/m.   General: The patient is well-developed and well-nourished and in no acute distress   Cardiovascular: The heart has a regular rate and rhythm with a normal S1 and S2.   Neurologic Exam  Mental status: The patient is alert and oriented x 3 at the time of the examination. The patient has apparent normal recent and remote memory, with an apparently normal attention span and concentration ability.   She has some pauses in her speech and one paraphrasic error.     Cranial nerves: Extraocular movements are full.  Facial strength and sensation is normal. Trapezius strength is strong. No dysarthria is noted.  The tongue is midline, and the patient has symmetric elevation of the soft palate. No obvious hearing deficits are noted.  Motor:  Muscle bulk is normal.   Tone is normal. Strength is  5 / 5 in all 4 extremities.   Sensory: He has intact sensation to touch and vibration  Coordination: Cerebellar testing reveals good finger-nose-finger and heel-to-shin bilaterally.  Gait and station: Station is normal.   Gait is slightly wide. Tandem gait is mildly wide. Romberg is negative.   Reflexes: Deep tendon reflexes are symmetric and 3 bilaterally in arms and knees and 2 at the ankles.  .   Plantar responses are flexor.    DIAGNOSTIC DATA (LABS, IMAGING, TESTING) - I reviewed patient records, labs, notes, testing and imaging myself where available.  Lab Results  Component Value Date   WBC 5.9 01/28/2016   HGB 14.1 01/28/2016   HCT 40.8 01/28/2016   MCV 88.1 01/28/2016   PLT 250 01/28/2016      Component Value Date/Time   NA 136 01/28/2016 1038   K 3.5 01/28/2016 1038   CL 106 01/28/2016 1038   CO2 19 (L) 01/28/2016 1038   GLUCOSE 96 01/28/2016 1038   BUN 16 01/28/2016 1038   CREATININE 0.85 01/28/2016 1038   CALCIUM 9.4 01/28/2016 1038   GFRNONAA  >60 01/28/2016 1038  GFRAA >60 01/28/2016 1038       ASSESSMENT AND PLAN  Cerebrovascular accident (CVA), unspecified mechanism (HCC)  Gait disturbance  Fluency disorder associated with underlying disease  Anxiety   1.    Continue taking aspirin on a daily basis 81 mg. 2.    Try to eat healthy and reduce excess salt and fat from the diet. Try to stay active and exercises as tolerated. 3.     She will continue BuSpar 5 mg twice a day (may go up on the dose later if she desires) 4.     I signed a form to ask for some accommodations at work (additional work was added to her recently and she is having trouble doing additional workup but was having no trouble doing her old work)  5.   She will return to see [place] in 6 months. If she is stable at that time no further follow-up would be necessary but she would need to continue on the aspirin. Call sooner if any significant new or worsening neurologic symptoms.    Richard A. Epimenio Foot, MD, [facility] 06/11/2017, 12:34 PM Certified in Neurology, Clinical Neurophysiology, Sleep Medicine, Pain Medicine and Neuroimaging  [facility] [address] [place], [place] [address] [phone]

## 2017-06-11 NOTE — Telephone Encounter (Signed)
OV notes faxed as requested/fim

## 2017-06-11 NOTE — Telephone Encounter (Signed)
Pt is requesting a fax of her follow up report from today 06/11/17 sent to [phone]

## 2017-06-12 NOTE — Telephone Encounter (Signed)
Pt has called stating that she heard the fax machine go off yesterday but the fax never came thru can RN Faith please resend

## 2017-06-12 NOTE — Telephone Encounter (Signed)
Yesterday's ov note refaxed to pt./fim

## 2017-06-15 DIAGNOSIS — Z1389 Encounter for screening for other disorder: Secondary | ICD-10-CM | POA: Diagnosis not present

## 2017-06-15 DIAGNOSIS — Z6832 Body mass index (BMI) 32.0-32.9, adult: Secondary | ICD-10-CM | POA: Diagnosis not present

## 2017-06-15 DIAGNOSIS — I639 Cerebral infarction, unspecified: Secondary | ICD-10-CM | POA: Diagnosis not present

## 2017-06-15 DIAGNOSIS — Z23 Encounter for immunization: Secondary | ICD-10-CM | POA: Diagnosis not present

## 2017-06-15 DIAGNOSIS — F419 Anxiety disorder, unspecified: Secondary | ICD-10-CM | POA: Diagnosis not present

## 2017-06-15 DIAGNOSIS — E6609 Other obesity due to excess calories: Secondary | ICD-10-CM | POA: Diagnosis not present

## 2017-06-19 ENCOUNTER — Telehealth: Payer: Self-pay | Admitting: Cardiovascular Disease

## 2017-06-19 NOTE — Telephone Encounter (Signed)
Patient called asking if Dr Purvis Sheffield will take her out of work

## 2017-06-19 NOTE — Telephone Encounter (Signed)
Intermittent leave - questions if he will take her out of work.  Symptoms that she is describing such as slurred speech, memory issues, weakness sounds like neurological issues & that would need to come from them in that case.  Patient stated that she spoke with [facility] on 06/17/2017 & was told that she was being referred to [facility].  Patient was on mobile phone & the call dropped.  I did call patient back & left message on voice mail that we would give her a call back the first of next weeks after we get more information.

## 2017-06-23 NOTE — Telephone Encounter (Signed)
Mrs. Decapua called the office stating that she wanted to know if we had made referral to [facility]. Please call on cell # [phone].

## 2017-06-24 NOTE — Telephone Encounter (Signed)
Patient made aware that info had been faxed to [facility].  That will go through a process of checking insurance first, then sending information to the [facility] doc there, then they will contact patient after this happens.  Also, informed her that taking her out of work would have to be handled by neurology.  Dr. Purvis Sheffield is only able to do this if these symptoms were to be deemed cardiac in nature.  Patient already has 3 month follow up scheduled for 07/02/2017 with Dr. Purvis Sheffield.

## 2017-06-29 ENCOUNTER — Ambulatory Visit: Payer: BLUE CROSS/BLUE SHIELD | Admitting: Neurology

## 2017-07-02 ENCOUNTER — Ambulatory Visit (INDEPENDENT_AMBULATORY_CARE_PROVIDER_SITE_OTHER): Payer: BLUE CROSS/BLUE SHIELD | Admitting: Cardiovascular Disease

## 2017-07-02 ENCOUNTER — Encounter: Payer: Self-pay | Admitting: Cardiovascular Disease

## 2017-07-02 VITALS — BP 122/78 | HR 80 | Ht 66.0 in | Wt 199.0 lb

## 2017-07-02 DIAGNOSIS — R000 Tachycardia, unspecified: Secondary | ICD-10-CM

## 2017-07-02 DIAGNOSIS — I639 Cerebral infarction, unspecified: Secondary | ICD-10-CM

## 2017-07-02 DIAGNOSIS — R002 Palpitations: Secondary | ICD-10-CM

## 2017-07-02 DIAGNOSIS — I10 Essential (primary) hypertension: Secondary | ICD-10-CM

## 2017-07-02 NOTE — Patient Instructions (Signed)

## 2017-07-02 NOTE — Progress Notes (Signed)
SUBJECTIVE: The patient presents for routine follow-up.  I performed a TEE on 05/05/17 which demonstrated normal left ventricular systolic function, LVEF 60-65%, no valvular pathology, no intracardiac thrombus, and no patent foramen ovale.  I placed a neurology referral.  She has a history of CVA.  MRI on 04/07/17 demonstrated a focus in the posterior left frontal lobe most consistent with a sub-chronic stroke.  There were some additional ischemic changes noted.  30-day event monitoring demonstrated sinus rhythm with no arrhythmias.  She denies chest pain, palpitations, leg swelling, and shortness of breath.  She is waiting to be evaluated by [facility] neurology.   Soc Hx: Her brother Mitchell Heir is also my patient.Works at [employer] in [place]. Married.   Review of Systems: As per "subjective", otherwise negative.  Allergies  Allergen Reactions  . Adhesive [Tape]     Including bandaids  . Darvocet [Propoxyphene N-Acetaminophen]   . Demerol     Current Outpatient Medications  Medication Sig Dispense Refill  . aspirin EC 81 MG tablet Take 81 mg daily by mouth.    . busPIRone (BUSPAR) 15 MG tablet Take 1 tablet (15 mg total) by mouth 2 (two) times daily. 60 tablet 5  . diazepam (VALIUM) 10 MG tablet Take 5 mg by mouth at bedtime as needed for anxiety.     . metoprolol tartrate (LOPRESSOR) 25 MG tablet TAKE ONE TABLET BY MOUTH TWICE DAILY 60 tablet 6  . rosuvastatin (CRESTOR) 10 MG tablet Take 1 tablet (10 mg total) daily by mouth. 90 tablet 3   No current facility-administered medications for this visit.    Facility-Administered Medications Ordered in Other Visits  Medication Dose Route Frequency Provider Last Rate Last Dose  . gadopentetate dimeglumine (MAGNEVIST) injection 18 mL  18 mL Intravenous Once PRN Sater, Pearletha Furl, MD        Past Medical History:  Diagnosis Date  . Anxiety   . Cold   . Heart palpitations   . History of IBS   . Stroke [facility])     Past  Surgical History:  Procedure Laterality Date  . ABDOMINAL HYSTERECTOMY    . BLADDER NECK SUSPENSION    . CHOLECYSTECTOMY    . COLONOSCOPY  01/23/2011   Procedure: COLONOSCOPY;  Surgeon: Malissa Hippo, MD;  Location: [facility];  Service: Endoscopy;  Laterality: N/A;  . LAPAROSCOPIC TUBAL LIGATION    . TEE WITHOUT CARDIOVERSION N/A 05/05/2017   Procedure: TRANSESOPHAGEAL ECHOCARDIOGRAM (TEE);  Surgeon: Laqueta Linden, MD;  Location: [facility];  Service: Cardiovascular;  Laterality: N/A;    Social History   Socioeconomic History  . Marital status: Married    Spouse name: Not on file  . Number of children: Not on file  . Years of education: Not on file  . Highest education level: Not on file  Social Needs  . Financial resource strain: Not on file  . Food insecurity - worry: Not on file  . Food insecurity - inability: Not on file  . Transportation needs - medical: Not on file  . Transportation needs - non-medical: Not on file  Occupational History  . Not on file  Tobacco Use  . Smoking status: Former Smoker    Packs/day: 0.50    Years: 15.00    Pack years: 7.50    Types: Cigarettes    Last attempt to quit: 09/16/2012    Years since quitting: 4.7  . Smokeless tobacco: Never Used  Substance and Sexual Activity  .  Alcohol use: Yes    Alcohol/week: 0.6 oz    Types: 1 Shots of liquor per week    Comment: every few months  . Drug use: No  . Sexual activity: Yes    Birth control/protection: Surgical  Other Topics Concern  . Not on file  Social History Narrative  . Not on file     Vitals:   07/02/17 1404  BP: 122/78  Pulse: 80  SpO2: 98%  Weight: 199 lb (90.3 kg)  Height: 5\' 6"  (1.676 m)    Wt Readings from Last 3 Encounters:  07/02/17 199 lb (90.3 kg)  06/11/17 197 lb 8 oz (89.6 kg)  05/05/17 194 lb (88 kg)     PHYSICAL EXAM General: NAD HEENT: Normal. Neck: No JVD, no thyromegaly. Lungs: Clear to auscultation bilaterally with normal  respiratory effort. CV: Regular rate and rhythm, normal S1/S2, no S3/S4, no murmur. No pretibial or periankle edema.  Abdomen: Soft, nontender, no distention.  Neurologic: Alert and oriented.  Psych: Normal affect. Skin: Normal. Musculoskeletal: No gross deformities.    ECG: Most recent ECG reviewed.   Labs: Lab Results  Component Value Date/Time   K 3.5 01/28/2016 10:38 AM   BUN 16 01/28/2016 10:38 AM   CREATININE 0.85 01/28/2016 10:38 AM   HGB 14.1 01/28/2016 10:38 AM     Lipids: No results found for: LDLCALC, LDLDIRECT, CHOL, TRIG, HDL     ASSESSMENT AND PLAN: 1.  Subchronic CVA: No evidence of arrhythmias with 30-day event monitoring, and specifically no evidence of atrial fibrillation.  She is currently on metoprolol and Crestor as well as aspirin 81 mg.  Transesophageal echocardiogram was normal as detailed above.  There was no evidence of patent foramen ovale and no intracardiac thrombus.  2.  Chronic hypertension: Controlled.  No changes to therapy.  3.  Tachycardia and palpitations: No evidence of arrhythmias with event monitoring.  She is currently on metoprolol.    Disposition: Follow up 1 year   Prentice Docker, M.D., F.A.C.C.

## 2017-07-06 ENCOUNTER — Emergency Department (HOSPITAL_COMMUNITY): Payer: BLUE CROSS/BLUE SHIELD

## 2017-07-06 ENCOUNTER — Inpatient Hospital Stay (HOSPITAL_COMMUNITY)
Admission: EM | Admit: 2017-07-06 | Discharge: 2017-07-08 | DRG: 060 | Disposition: A | Discharge status: Home / Self Care | Payer: BLUE CROSS/BLUE SHIELD | Attending: Internal Medicine | Admitting: Internal Medicine

## 2017-07-06 ENCOUNTER — Encounter (HOSPITAL_COMMUNITY): Payer: Self-pay

## 2017-07-06 ENCOUNTER — Other Ambulatory Visit: Payer: Self-pay

## 2017-07-06 DIAGNOSIS — E7800 Pure hypercholesterolemia, unspecified: Secondary | ICD-10-CM | POA: Diagnosis not present

## 2017-07-06 DIAGNOSIS — Z91048 Other nonmedicinal substance allergy status: Secondary | ICD-10-CM

## 2017-07-06 DIAGNOSIS — G939 Disorder of brain, unspecified: Secondary | ICD-10-CM

## 2017-07-06 DIAGNOSIS — Z87891 Personal history of nicotine dependence: Secondary | ICD-10-CM

## 2017-07-06 DIAGNOSIS — Z7982 Long term (current) use of aspirin: Secondary | ICD-10-CM | POA: Diagnosis not present

## 2017-07-06 DIAGNOSIS — I69991 Dysphagia following unspecified cerebrovascular disease: Secondary | ICD-10-CM

## 2017-07-06 DIAGNOSIS — F418 Other specified anxiety disorders: Secondary | ICD-10-CM | POA: Diagnosis present

## 2017-07-06 DIAGNOSIS — R29810 Facial weakness: Secondary | ICD-10-CM | POA: Diagnosis present

## 2017-07-06 DIAGNOSIS — K589 Irritable bowel syndrome without diarrhea: Secondary | ICD-10-CM | POA: Diagnosis not present

## 2017-07-06 DIAGNOSIS — Z885 Allergy status to narcotic agent status: Secondary | ICD-10-CM

## 2017-07-06 DIAGNOSIS — G35 Multiple sclerosis: Secondary | ICD-10-CM | POA: Diagnosis not present

## 2017-07-06 DIAGNOSIS — I69928 Other speech and language deficits following unspecified cerebrovascular disease: Secondary | ICD-10-CM

## 2017-07-06 DIAGNOSIS — G379 Demyelinating disease of central nervous system, unspecified: Secondary | ICD-10-CM | POA: Diagnosis not present

## 2017-07-06 DIAGNOSIS — R1310 Dysphagia, unspecified: Secondary | ICD-10-CM | POA: Diagnosis not present

## 2017-07-06 DIAGNOSIS — Z79899 Other long term (current) drug therapy: Secondary | ICD-10-CM

## 2017-07-06 DIAGNOSIS — R4781 Slurred speech: Secondary | ICD-10-CM

## 2017-07-06 DIAGNOSIS — I639 Cerebral infarction, unspecified: Secondary | ICD-10-CM | POA: Diagnosis not present

## 2017-07-06 DIAGNOSIS — I69341 Monoplegia of lower limb following cerebral infarction affecting right dominant side: Secondary | ICD-10-CM

## 2017-07-06 DIAGNOSIS — F419 Anxiety disorder, unspecified: Secondary | ICD-10-CM | POA: Diagnosis not present

## 2017-07-06 DIAGNOSIS — I10 Essential (primary) hypertension: Secondary | ICD-10-CM | POA: Diagnosis present

## 2017-07-06 DIAGNOSIS — K219 Gastro-esophageal reflux disease without esophagitis: Secondary | ICD-10-CM | POA: Diagnosis not present

## 2017-07-06 DIAGNOSIS — G9389 Other specified disorders of brain: Secondary | ICD-10-CM

## 2017-07-06 DIAGNOSIS — E785 Hyperlipidemia, unspecified: Secondary | ICD-10-CM | POA: Diagnosis not present

## 2017-07-06 DIAGNOSIS — R471 Dysarthria and anarthria: Secondary | ICD-10-CM | POA: Diagnosis not present

## 2017-07-06 DIAGNOSIS — I69351 Hemiplegia and hemiparesis following cerebral infarction affecting right dominant side: Secondary | ICD-10-CM | POA: Diagnosis not present

## 2017-07-06 DIAGNOSIS — R836 Abnormal cytological findings in cerebrospinal fluid: Secondary | ICD-10-CM | POA: Diagnosis not present

## 2017-07-06 DIAGNOSIS — R269 Unspecified abnormalities of gait and mobility: Secondary | ICD-10-CM | POA: Diagnosis not present

## 2017-07-06 HISTORY — DX: Pure hypercholesterolemia, unspecified: E78.00

## 2017-07-06 HISTORY — DX: Essential (primary) hypertension: I10

## 2017-07-06 LAB — I-STAT TROPONIN, ED: Troponin i, poc: 0 ng/mL (ref 0.00–0.08)

## 2017-07-06 LAB — I-STAT BETA HCG BLOOD, ED (MC, WL, AP ONLY): I-stat hCG, quantitative: <5 m[IU]/mL (ref <5)

## 2017-07-06 LAB — DIFFERENTIAL
Basophils Absolute: 0 10*3/uL (ref 0.0–0.1)
Basophils Relative: 0 %
EOS ABS: 0.6 10*3/uL (ref 0.0–0.7)
EOS PCT: 8 %
LYMPHS ABS: 2.7 10*3/uL (ref 0.7–4.0)
LYMPHS PCT: 35 %
MONO ABS: 0.3 10*3/uL (ref 0.1–1.0)
Monocytes Relative: 4 %
NEUTROS PCT: 53 %
Neutro Abs: 4.1 10*3/uL (ref 1.7–7.7)

## 2017-07-06 LAB — CBC
HCT: 41.8 % (ref 36.0–46.0)
Hemoglobin: 13.8 g/dL (ref 12.0–15.0)
MCH: 30 pg (ref 26.0–34.0)
MCHC: 33 g/dL (ref 30.0–36.0)
MCV: 90.9 fL (ref 78.0–100.0)
PLATELETS: 248 10*3/uL (ref 150–400)
RBC: 4.6 MIL/uL (ref 3.87–5.11)
RDW: 12.6 % (ref 11.5–15.5)
WBC: 7.7 10*3/uL (ref 4.0–10.5)

## 2017-07-06 LAB — COMPREHENSIVE METABOLIC PANEL
ALK PHOS: 56 U/L (ref 38–126)
ALT: 30 U/L (ref 14–54)
ANION GAP: 10 (ref 5–15)
AST: 32 U/L (ref 15–41)
Albumin: 4.4 g/dL (ref 3.5–5.0)
BILIRUBIN TOTAL: 0.5 mg/dL (ref 0.3–1.2)
BUN: 13 mg/dL (ref 6–20)
CALCIUM: 9 mg/dL (ref 8.9–10.3)
CO2: 22 mmol/L (ref 22–32)
Chloride: 107 mmol/L (ref 101–111)
Creatinine, Ser: 0.76 mg/dL (ref 0.44–1.00)
GFR calc non Af Amer: >60 mL/min (ref >60)
Glucose, Bld: 95 mg/dL (ref 65–99)
POTASSIUM: 3.6 mmol/L (ref 3.5–5.1)
SODIUM: 139 mmol/L (ref 135–145)
TOTAL PROTEIN: 7.4 g/dL (ref 6.5–8.1)

## 2017-07-06 LAB — URINALYSIS, ROUTINE W REFLEX MICROSCOPIC
Bilirubin Urine: NEGATIVE
Glucose, UA: NEGATIVE mg/dL
Ketones, ur: NEGATIVE mg/dL
LEUKOCYTES UA: NEGATIVE
NITRITE: NEGATIVE
PH: 5.5 (ref 5.0–8.0)
Protein, ur: NEGATIVE mg/dL
SPECIFIC GRAVITY, URINE: 1.01 (ref 1.005–1.030)

## 2017-07-06 LAB — PROTIME-INR
INR: 0.96
PROTHROMBIN TIME: 12.7 s (ref 11.4–15.2)

## 2017-07-06 LAB — ETHANOL: Alcohol, Ethyl (B): <10 mg/dL (ref <10)

## 2017-07-06 LAB — URINALYSIS, MICROSCOPIC (REFLEX): WBC, UA: NONE SEEN WBC/hpf (ref 0–5)

## 2017-07-06 LAB — RAPID URINE DRUG SCREEN, HOSP PERFORMED
Amphetamines: NOT DETECTED
Barbiturates: NOT DETECTED
Benzodiazepines: POSITIVE — AB
COCAINE: NOT DETECTED
OPIATES: NOT DETECTED
Tetrahydrocannabinol: NOT DETECTED

## 2017-07-06 LAB — APTT: aPTT: 32 seconds (ref 24–36)

## 2017-07-06 LAB — SEDIMENTATION RATE: SED RATE: 15 mm/h (ref 0–22)

## 2017-07-06 MED ORDER — GADOBENATE DIMEGLUMINE 529 MG/ML IV SOLN
20.0000 mL | Freq: Once | INTRAVENOUS | Status: AC | PRN
  Administered 2017-07-06: 18 mL via INTRAVENOUS

## 2017-07-06 MED ORDER — ROSUVASTATIN CALCIUM 10 MG PO TABS
10.0000 mg | ORAL_TABLET | Freq: Every day | ORAL | Status: DC
  Administered 2017-07-06 – 2017-07-07 (×2): 10 mg via ORAL
  Filled 2017-07-06 (×2): qty 1

## 2017-07-06 MED ORDER — IBUPROFEN 400 MG PO TABS
400.0000 mg | ORAL_TABLET | Freq: Four times a day (QID) | ORAL | Status: DC | PRN
  Administered 2017-07-06 – 2017-07-07 (×2): 400 mg via ORAL
  Filled 2017-07-06 (×3): qty 1

## 2017-07-06 MED ORDER — LORATADINE 10 MG PO TABS
10.0000 mg | ORAL_TABLET | Freq: Every day | ORAL | Status: DC
  Administered 2017-07-06 – 2017-07-08 (×3): 10 mg via ORAL
  Filled 2017-07-06 (×3): qty 1

## 2017-07-06 MED ORDER — ONDANSETRON HCL 4 MG PO TABS: 4.0000 mg | ORAL_TABLET | Freq: Four times a day (QID) | ORAL | Status: DC | PRN

## 2017-07-06 MED ORDER — METHYLPREDNISOLONE SODIUM SUCC 125 MG IJ SOLR: 1000.0000 mg | Freq: Once | INTRAMUSCULAR | Status: DC

## 2017-07-06 MED ORDER — SODIUM CHLORIDE 0.9 % IV SOLN
INTRAVENOUS | Status: DC
  Administered 2017-07-07 – 2017-07-08 (×4): via INTRAVENOUS

## 2017-07-06 MED ORDER — ENOXAPARIN SODIUM 40 MG/0.4ML ~~LOC~~ SOLN: 40.0000 mg | SUBCUTANEOUS | Status: DC

## 2017-07-06 MED ORDER — ONDANSETRON HCL 4 MG/2ML IJ SOLN: 4.0000 mg | Freq: Four times a day (QID) | INTRAMUSCULAR | Status: DC | PRN

## 2017-07-06 MED ORDER — DIAZEPAM 5 MG PO TABS
10.0000 mg | ORAL_TABLET | Freq: Every evening | ORAL | Status: DC | PRN
  Administered 2017-07-06 – 2017-07-07 (×2): 10 mg via ORAL
  Filled 2017-07-06 (×2): qty 2

## 2017-07-06 MED ORDER — SODIUM CHLORIDE 0.9 % IV SOLN
1000.0000 mg | INTRAVENOUS | Status: DC
  Administered 2017-07-07: 1000 mg via INTRAVENOUS
  Filled 2017-07-06 (×3): qty 8

## 2017-07-06 MED ORDER — METOPROLOL TARTRATE 25 MG PO TABS
25.0000 mg | ORAL_TABLET | Freq: Two times a day (BID) | ORAL | Status: DC
  Administered 2017-07-06 – 2017-07-07 (×2): 25 mg via ORAL
  Filled 2017-07-06 (×4): qty 1

## 2017-07-06 MED ORDER — PANTOPRAZOLE SODIUM 40 MG PO TBEC
40.0000 mg | DELAYED_RELEASE_TABLET | Freq: Every day | ORAL | Status: DC
  Administered 2017-07-07 – 2017-07-08 (×2): 40 mg via ORAL
  Filled 2017-07-06 (×2): qty 1

## 2017-07-06 MED ORDER — SODIUM CHLORIDE 0.9 % IV SOLN
1000.0000 mg | Freq: Once | INTRAVENOUS | Status: AC
  Administered 2017-07-06: 1000 mg via INTRAVENOUS
  Filled 2017-07-06: qty 8

## 2017-07-06 MED ORDER — ASPIRIN EC 81 MG PO TBEC
81.0000 mg | DELAYED_RELEASE_TABLET | Freq: Every day | ORAL | Status: DC
  Administered 2017-07-06 – 2017-07-08 (×3): 81 mg via ORAL
  Filled 2017-07-06 (×3): qty 1

## 2017-07-06 MED ORDER — BUSPIRONE HCL 15 MG PO TABS
15.0000 mg | ORAL_TABLET | Freq: Two times a day (BID) | ORAL | Status: DC
  Administered 2017-07-06 – 2017-07-07 (×2): 15 mg via ORAL
  Filled 2017-07-06 (×2): qty 1

## 2017-07-06 NOTE — ED Notes (Signed)
Neurologist speaking with Dr Jacqulyn Bath

## 2017-07-06 NOTE — ED Notes (Signed)
Patient transported to MRI 

## 2017-07-06 NOTE — ED Triage Notes (Signed)
Patient reports of slurred speech since Friday. States she feels like she cant get her words out right. Also complains in left sided neck/jaw pain that started this morning. Denies chest or arm pain.A&Ox4.

## 2017-07-06 NOTE — Consult Note (Signed)
[place] A. Merlene Laughter, MD     [email or web address]          Lindsey Phelps is an 37 y.o. female.   ASSESSMENT/PLAN: 1. Acute dysarthria on a more subacute dysarthria and the gait impairment right leg weakness: MRI is concerning for multiple potential etiologies including primary CNS vasculitis, systemic vasculitis, metastatic disease, infectious processes, primary CNS malignancy, demyelinating disease and embolic strokes. The patient has been started on Solu-Medrol. We will do this for 3 days and assess improvement in the patient. Spinal tap will be done to include cytology amongst other testing. Extensive labs will be done including HIV, RPR, sedimentation rate, C-reactive protein and ANA. Ultimately, the patient may need to have brain biopsy to known definitively what the etiology. We will likely arrange for this to be done in [place] once she is discharged. This is discussed at length with the patient and her significant other.       The patient 37 year old white female who has been having episodes of dysarthria a few months ago. She did see Dr. Felecia Shelling who thought she may have moderate disease although he was concerned that she may be having some word finding difficulties and a MRI was obtained. MRI was done but the results shows evidence of a left frontal lesion suggestive of a stroke. She did have extensive workup for this. She tells me that she also has had some gait imbalance and notice right leg weakness at that time. She developed the relatively acute onset of worsening dysarthria a few days ago and this led her to seek medical attention in the emergency room. She also appears to have some left lower facial numbness. Her right leg weakness is unchanged. No weakness involving the upper extremities or the left leg. She probably has a mother who has been diagnosed with demyelination disease involving the optic nerve but now the brain. The review systems otherwise  negative.        GNA 06/2017 I first saw her 03/31/2017.   Was concerned about her word finding difficulties and an MRI of the brain was performed. It showed a subacute left frontal stroke. There were a few other punctate T2/FLAIR hyperintense foci consistent with minimal chronic microvascular ischemic change.   I was most concerned about an embolic etiology. The carotid ultrasound was essentially normal. The cardiac event monitor was essentially normal.  A TEE showed a normal ejection fraction of 60-65 percent.   There was no evidence of a patent foramen ovale or other right-to-left shunt. The study was felt to be normal.  She is on aspirin, metoprolol and Crestor.  Here to the previous visit, she feels her speech is not much  Better abd she still has some aphasia and feels she needs to concentrate more with her speech.  She feels her right arm is still clumsy.   She has noted she spells words wrong a lot and her typing is slowed with more errors.   She still has some slurring. There are no new symptoms.l;  She also has some anxiety and BuSpar was started.   It helped some but may have contributed to dizziness and headaches so the dose was decreased to 5 mg twice a day.    Headaches are less frequent in general (she was having more at last visit).   She will get occasional sharp pains.     GNA 10 -2018 She is a 36 yo woman who had an episode with chest pain and bilateral arm  numbness on 01/27/2016 and was taken to the ED (workup was normal).  She who noted numbness in her jaw at night about 5 months ago when starting to fall asleep.  In August 2018, she had another episode of chest tightness and arm numbness and went to an ED.   Since then, she has noted more symptoms such as slurred speech, verbal fluency issues, leg weakness and balance issues.    These symptoms mostly come and go for 10 - 30 minutes and now occur daily.   They occur randomly and in any position (sitting, standing, walking).     She also notes vision is blurry and she has episodes of worsening blurry vision, also lasting 10-30 minutes.   She has vertigo that is mild but constant.   It intensifies for a few minutes at a time sometimes while she is walking.      She has had panic attacks since 2016 after her brother was in a serious accident but she feels current symptoms are different.  She takes valium prn, just a few a month.  Many years ago, she was on Celexa.   She continues to note episodes of palpitations and tachycardia.   Metoprolol was started with benefit.         GENERAL: Patient is mildly overweight and appears somewhat slow and in discomfort but no acute distress.  HEENT: Neck is supple.  ABDOMEN: soft  EXTREMITIES: No edema   BACK: This is normal.  SKIN: Normal by inspection.    MENTAL STATUS: Alert and oriented. Speech is mildly dysarthric, language and cognition are generally intact. Judgment and insight normal.   CRANIAL NERVES: Pupils are equal, round and reactive to light and accomodation; extra ocular movements are full, there is no significant nystagmus; visual fields are full; upper and lower facial muscles are normal in strength and symmetric, there is no flattening of the nasolabial folds; tongue is midline; uvula is midline; shoulder elevation is normal.  MOTOR: Upper extremity shows normal tone, bulk and strength. There is mild right leg weakness graded as 4+/5 on hip flexion. Dorsiflexion is 5. Left leg is normal.  COORDINATION: Left finger to nose is normal, right finger to nose is normal, No rest tremor; no intention tremor; no postural tremor; no bradykinesia.  REFLEXES: Deep tendon reflexes are symmetrical and normal.   SENSATION: Normal to light touch, temperature, and pain.  GAIT: She ambulates by holding and gait is somewhat mildly unsteady.       Blood pressure 138/76, pulse (!) 106, temperature 98 F (36.7 C), temperature source Oral, resp. rate 16, height '5\' 6"'   (1.676 m), weight 197 lb (89.4 kg), SpO2 95 %.  Past Medical History:  Diagnosis Date  . Anxiety   . Cold   . Heart palpitations   . High cholesterol   . History of IBS   . Hypertension   . Stroke [facility])     Past Surgical History:  Procedure Laterality Date  . ABDOMINAL HYSTERECTOMY    . BLADDER NECK SUSPENSION    . CHOLECYSTECTOMY    . COLONOSCOPY  01/23/2011   Procedure: COLONOSCOPY;  Surgeon: Rogene Houston, MD;  Location: [facility];  Service: Endoscopy;  Laterality: N/A;  . LAPAROSCOPIC TUBAL LIGATION    . TEE WITHOUT CARDIOVERSION N/A 05/05/2017   Procedure: TRANSESOPHAGEAL ECHOCARDIOGRAM (TEE);  Surgeon: Herminio Commons, MD;  Location: [facility] ENDO SUITE;  Service: Cardiovascular;  Laterality: N/A;    Family History  Problem Relation Age  of Onset  . Diabetes Father   . Hypertension Father   . Colon polyps Father   . Lung cancer Father   . Hypertension Mother   . Coronary artery disease Mother   . COPD Mother     Social History:  reports that she quit smoking about 4 years ago. Her smoking use included cigarettes. She has a 7.50 pack-year smoking history. she has never used smokeless tobacco. She reports that she drinks about 0.6 oz of alcohol per week. She reports that she does not use drugs.  Allergies:  Allergies  Allergen Reactions  . Adhesive [Tape]     Including bandaids  . Darvocet [Propoxyphene N-Acetaminophen]   . Demerol     Medications: Prior to Admission medications   Medication Sig Start Date End Date Taking? Authorizing Provider  aspirin EC 81 MG tablet Take 81 mg daily by mouth.   Yes [provider]  busPIRone (BUSPAR) 15 MG tablet Take 1 tablet (15 mg total) by mouth 2 (two) times daily. 03/31/17  Yes Sater, Nanine Means, MD  diazepam (VALIUM) 10 MG tablet Take 10 mg by mouth at bedtime as needed for anxiety.    Yes [provider]  loratadine (CLARITIN) 10 MG tablet Take 10 mg by mouth daily.   Yes [provider]    metoprolol tartrate (LOPRESSOR) 25 MG tablet TAKE ONE TABLET BY MOUTH TWICE DAILY 04/27/17  Yes Herminio Commons, MD  rosuvastatin (CRESTOR) 10 MG tablet Take 1 tablet (10 mg total) daily by mouth. 04/20/17 07/19/17 Yes Herminio Commons, MD    Scheduled Meds: . aspirin EC  81 mg Oral Daily  . busPIRone  15 mg Oral BID  . loratadine  10 mg Oral Daily  . metoprolol tartrate  25 mg Oral BID  . pantoprazole  40 mg Oral Daily  . rosuvastatin  10 mg Oral q1800   Continuous Infusions: . sodium chloride    . [START ON 07/07/2017] methylPREDNISolone (SOLU-MEDROL) injection     PRN Meds:.diazepam, ibuprofen, ondansetron **OR** ondansetron (ZOFRAN) IV     Results for orders placed or performed during the hospital encounter of 07/06/17 (from the past 48 hour(s))  Urine rapid drug screen (hosp performed)     Status: Abnormal   Collection Time: 07/06/17  8:37 AM  Result Value Ref Range   Opiates NONE DETECTED NONE DETECTED   Cocaine NONE DETECTED NONE DETECTED   Benzodiazepines POSITIVE (A) NONE DETECTED   Amphetamines NONE DETECTED NONE DETECTED   Tetrahydrocannabinol NONE DETECTED NONE DETECTED   Barbiturates NONE DETECTED NONE DETECTED    Comment: (NOTE) DRUG SCREEN FOR MEDICAL PURPOSES ONLY.  IF CONFIRMATION IS NEEDED FOR ANY PURPOSE, NOTIFY LAB WITHIN 5 DAYS. LOWEST DETECTABLE LIMITS FOR URINE DRUG SCREEN Drug Class                     Cutoff (ng/mL) Amphetamine and metabolites    1000 Barbiturate and metabolites    200 Benzodiazepine                 324 Tricyclics and metabolites     300 Opiates and metabolites        300 Cocaine and metabolites        300 THC                            50 Performed at [facility], [address] [place], [place] [address]  Urinalysis, Routine w reflex microscopic     Status: Abnormal   Collection Time: 07/06/17  8:37 AM  Result Value Ref Range   Color, Urine YELLOW YELLOW   APPearance CLEAR CLEAR   Specific Gravity, Urine 1.010  1.005 - 1.030   pH 5.5 5.0 - 8.0   Glucose, UA NEGATIVE NEGATIVE mg/dL   Hgb urine dipstick TRACE (A) NEGATIVE   Bilirubin Urine NEGATIVE NEGATIVE   Ketones, ur NEGATIVE NEGATIVE mg/dL   Protein, ur NEGATIVE NEGATIVE mg/dL   Nitrite NEGATIVE NEGATIVE   Leukocytes, UA NEGATIVE NEGATIVE    Comment: Performed at [facility], [address] [place], [place] [address]  Urinalysis, Microscopic (reflex)     Status: Abnormal   Collection Time: 07/06/17  8:37 AM  Result Value Ref Range   RBC / HPF 0-5 0 - 5 RBC/hpf   WBC, UA NONE SEEN 0 - 5 WBC/hpf   Bacteria, UA MANY (A) NONE SEEN   Squamous Epithelial / LPF TOO NUMEROUS TO COUNT (A) NONE SEEN    Comment: Performed at [facility], [address], [place], [place] [address]  Ethanol     Status: None   Collection Time: 07/06/17  8:50 AM  Result Value Ref Range   Alcohol, Ethyl (B) <10 <10 mg/dL    Comment:        LOWEST DETECTABLE LIMIT FOR SERUM ALCOHOL IS 10 mg/dL FOR MEDICAL PURPOSES ONLY Performed at [facility], [address], [place], [place] [address]   Protime-INR     Status: None   Collection Time: 07/06/17  8:53 AM  Result Value Ref Range   Prothrombin Time 12.7 11.4 - 15.2 seconds   INR 0.96     Comment: Performed at [facility], [address], [place], [place] [address]  APTT     Status: None   Collection Time: 07/06/17  8:53 AM  Result Value Ref Range   aPTT 32 24 - 36 seconds    Comment: Performed at [facility], [address] [place], [place] [address]  CBC     Status: None   Collection Time: 07/06/17  8:53 AM  Result Value Ref Range   WBC 7.7 4.0 - 10.5 K/uL   RBC 4.60 3.87 - 5.11 MIL/uL   Hemoglobin 13.8 12.0 - 15.0 g/dL   HCT 41.8 36.0 - 46.0 %   MCV 90.9 78.0 - 100.0 fL   MCH 30.0 26.0 - 34.0 pg   MCHC 33.0 30.0 - 36.0 g/dL   RDW 12.6 11.5 - 15.5 %   Platelets 248 150 - 400 K/uL    Comment: Performed at [facility], [address], [place], [place] [address]  Differential     Status: None    Collection Time: 07/06/17  8:53 AM  Result Value Ref Range   Neutrophils Relative % 53 %   Neutro Abs 4.1 1.7 - 7.7 K/uL   Lymphocytes Relative 35 %   Lymphs Abs 2.7 0.7 - 4.0 K/uL   Monocytes Relative 4 %   Monocytes Absolute 0.3 0.1 - 1.0 K/uL   Eosinophils Relative 8 %   Eosinophils Absolute 0.6 0.0 - 0.7 K/uL   Basophils Relative 0 %   Basophils Absolute 0.0 0.0 - 0.1 K/uL    Comment: Performed at [facility], [address] [place],  [address]  Comprehensive metabolic panel     Status: None   Collection Time: 07/06/17  8:53 AM  Result Value Ref Range   Sodium 139 135 -  145 mmol/L   Potassium 3.6 3.5 - 5.1 mmol/L   Chloride 107 101 - 111 mmol/L   CO2 22 22 - 32 mmol/L   Glucose, Bld 95 65 - 99 mg/dL   BUN 13 6 - 20 mg/dL   Creatinine, Ser 0.76 0.44 - 1.00 mg/dL   Calcium 9.0 8.9 - 10.3 mg/dL   Total Protein 7.4 6.5 - 8.1 g/dL   Albumin 4.4 3.5 - 5.0 g/dL   AST 32 15 - 41 U/L   ALT 30 14 - 54 U/L   Alkaline Phosphatase 56 38 - 126 U/L   Total Bilirubin 0.5 0.3 - 1.2 mg/dL   GFR calc non Af Amer >60 >60 mL/min   GFR calc Af Amer >60 >60 mL/min    Comment: (NOTE) The eGFR has been calculated using the CKD EPI equation. This calculation has not been validated in all clinical situations. eGFR's persistently <60 mL/min signify possible Chronic Kidney Disease.    Anion gap 10 5 - 15    Comment: Performed at [facility], [address] [place], [place] [address]  I-stat troponin, ED     Status: None   Collection Time: 07/06/17  8:59 AM  Result Value Ref Range   Troponin i, poc 0.00 0.00 - 0.08 ng/mL   Comment 3            Comment: Due to the release kinetics of cTnI, a negative result within the first hours of the onset of symptoms does not rule out myocardial infarction with certainty. If myocardial infarction is still suspected, repeat the test at appropriate intervals.   I-Stat beta hCG blood, ED     Status: None   Collection Time: 07/06/17  8:59 AM    Result Value Ref Range   I-stat hCG, quantitative <5.0 <5 mIU/mL   Comment 3            Comment:   GEST. AGE      CONC.  (mIU/mL)   <=1 WEEK        5 - 50     2 WEEKS       50 - 500     3 WEEKS       100 - 10,000     4 WEEKS     1,000 - 30,000        FEMALE AND NON-PREGNANT FEMALE:     LESS THAN 5 mIU/mL     Studies/Results:    FINDINGS: Brain: Multiple foci of somewhat ring-like restricted diffusion with neighboring edematous appearance and local gyral expansion. These are seen in the right superficial temporal, bilateral superficial frontal, and parasagittal left frontal lobes-the latter reaching the corpus callosum body. There is associated enhancement that has a horseshoe type appearance. No central restricted diffusion for abscess. No typical nodular enhancement for metastasis. A small focus of nodular white matter enhancement lateral to the frontal horn of the left lateral ventricle has subsided since comparison brain MR. There is also a nonenhancing signal abnormality about the temporal horn of the left lateral ventricle. The cortex is spared, not expected for multifocal peripheral subacute infarct. The overall pattern is that of demyelination with tumefactive features, as with multiple sclerosis variant. There is no deep gray or brainstem involvement as commonly seen with ADEM, cerebritis, or Bechet's.  Vascular: Major flow voids are preserved.  Skull and upper cervical spine: No evidence of marrow lesion.  Sinuses/Orbits: Negative  IMPRESSION: Multiple areas of juxtacortical edema and enhancement, pattern strongly favoring  tumefactive demyelination. Please see above.    The brain MRI scan is reviewed in person. There are large white matter lesions approximately 5. Three involves the right hemisphere and two on the left hemisphere. These are seen on FLAIR imaging are mostly limited to the white matter. T1 shows reduced signal involving the R temporal and  one of the right frontal regions. Lesions enhance weightbearing degrees. Only the left frontal lesion was seen on the scan done November 2018. This lesion also enhances in a semi-circular fashion.     Khia Dieterich A. Merlene Laughter, M.D.  Diplomate, Tax adviser of Psychiatry and Neurology ( Neurology). 07/06/2017, 8:23 PM

## 2017-07-06 NOTE — Consult Note (Signed)
[facility] Phone Consult  Contacted by ED MD in regards to 17 yof with hx of CVA and now with facial numbness/headache for several days. CT scan with concern for evolving stroke.  MRI brain was done report is pending. However per my review concerning for ring enhancing lesion in R hemisphere with other areas of enhancement.  No candidate for acute intervention from stroke stand point but needs admission and further work up. Will need to consider decadron, keppra for seizure prophy if hx of recent sz spell. Will need inpt neurology evaluation, work for metastatic process. Thank you for allowing [place] to participate in the care of your patient, if there are any questions please don't hesitate to contact us Discussed plan of care with hospital staff   Physician: Terrace Arabia, DO

## 2017-07-06 NOTE — ED Notes (Signed)
Spoke with [facility] for neuro consult. They will call me back

## 2017-07-06 NOTE — H&P (Addendum)
TRH H&P   Patient Demographics:    Lindsey Phelps, is a 37 y.o. female  MRN: [id number]   DOB - 26-Nov-1980  Admit Date - 07/06/2017  Outpatient Primary MD for the patient is Elfredia Nevins, MD  Referring MD: Dr. Jacqulyn Bath  Outpatient Specialists: Dr. Epimenio Foot (neurology)  Patient coming from: Home  Chief Complaint  Patient presents with  . Aphasia  . Jaw Pain      HPI:    Lindsey Phelps  is a 37 y.o. female, 37 year old female with history of hypertension, hyperlipidemia, CVA diagnosed in August 2018 with ongoing slurred speech and some residual right-sided weakness presented to the ED with 4-day history of worsened left-sided facial droop and slurred speech.  Patient reports that she has residual speech impairment with frequent word finding difficulty and some slurring since her stroke last year.  4 days back she noticed mild left-sided facial droop with worsened speech.  This was associated with sharp pain in her left side of the neck.  She also reports off and on difficulty swallowing. Reported some headache in the ED but prior to that denied any similar symptoms,  blurred vision, dizziness, tingling or numbness of her extremities, chest pain, palpitations, shortness of breath, abdominal pain, nausea, vomiting, fevers, chills, loss of consciousness, dysuria, diarrhea, urinary or bowel incontinence.  She denies similar symptoms in the past.  Patient denies any sick contact or recent travel.  Denies use of illicit drugs, change in her medication recently or any over-the-counter medications or supplements. Patient was following with Dr. Epimenio Foot with [facility] neurology and has been referred to [facility] neurology for a second opinion and patient is waiting for callback from them.  Course in the ED Vitals were stable.  Blood work was unremarkable.  Head CT showed areas of low density in the posterior  right temporal lobe and both frontal lobes concerning for subacute infarct.  Telemetry neurologist was consulted who recommended follow-up with MRI. MRI of the brain done showed couple areas of cortical edema and enhancement bilaterally favoring demyelination. Dr. Gerilyn Pilgrim was consulted who recommended giving her high-dose Solu-Medrol (1000 mg).  Recommended o admission to hospitalist service and he will evaluate.     Review of systems:    In addition to the HPI above,  No Fever-chills, left-sided facial droop, speech impairment and episodic difficulty swallowing Headache +, left sided neck pain, no changes with Vision or hearing, No problems swallowing food or Liquids, No Chest pain, Cough or Shortness of Breath, No Abdominal pain, No Nausea or vomiting, Bowel movements are regular, No Blood in stool or Urine, No dysuria, No new skin rashes or bruises, No new joints pains-aches,  No new weakness, healing or numbness of the extremity No recent weight gain or loss, No polyuria, polydypsia or polyphagia, No significant Mental Stressors.    With Past History of the following :  Past Medical History:  Diagnosis Date  . Anxiety   . Cold   . Heart palpitations   . High cholesterol   . History of IBS   . Hypertension   . Stroke [facility])       Past Surgical History:  Procedure Laterality Date  . ABDOMINAL HYSTERECTOMY    . BLADDER NECK SUSPENSION    . CHOLECYSTECTOMY    . COLONOSCOPY  01/23/2011   Procedure: COLONOSCOPY;  Surgeon: Malissa Hippo, MD;  Location: [facility];  Service: Endoscopy;  Laterality: N/A;  . LAPAROSCOPIC TUBAL LIGATION    . TEE WITHOUT CARDIOVERSION N/A 05/05/2017   Procedure: TRANSESOPHAGEAL ECHOCARDIOGRAM (TEE);  Surgeon: Laqueta Linden, MD;  Location: [facility] ENDO SUITE;  Service: Cardiovascular;  Laterality: N/A;      Social History:     Social History   Tobacco Use  . Smoking status: Former Smoker    Packs/day: 0.50    Years: 15.00     Pack years: 7.50    Types: Cigarettes    Last attempt to quit: 09/16/2012    Years since quitting: 4.8  . Smokeless tobacco: Never Used  Substance Use Topics  . Alcohol use: Yes    Alcohol/week: 0.6 oz    Types: 1 Shots of liquor per week    Comment: every few months     Lives -home with husband  Mobility -independent with mild left-sided weakness     Family History :     Family History  Problem Relation Age of Onset  . Diabetes Father   . Hypertension Father   . Colon polyps Father   . Lung cancer Father   . Hypertension Mother   . Coronary artery disease Mother   . COPD Mother       Home Medications:   Prior to Admission medications   Medication Sig Start Date End Date Taking? Authorizing Provider  aspirin EC 81 MG tablet Take 81 mg daily by mouth.   Yes [provider]  busPIRone (BUSPAR) 15 MG tablet Take 1 tablet (15 mg total) by mouth 2 (two) times daily. 03/31/17  Yes Sater, Pearletha Furl, MD  diazepam (VALIUM) 10 MG tablet Take 10 mg by mouth at bedtime as needed for anxiety.    Yes [provider]  loratadine (CLARITIN) 10 MG tablet Take 10 mg by mouth daily.   Yes [provider]  metoprolol tartrate (LOPRESSOR) 25 MG tablet TAKE ONE TABLET BY MOUTH TWICE DAILY 04/27/17  Yes Laqueta Linden, MD  rosuvastatin (CRESTOR) 10 MG tablet Take 1 tablet (10 mg total) daily by mouth. 04/20/17 07/19/17 Yes Laqueta Linden, MD     Allergies:     Allergies  Allergen Reactions  . Adhesive [Tape]     Including bandaids  . Darvocet [Propoxyphene N-Acetaminophen]   . Demerol      Physical Exam:   Vitals  Blood pressure 95/73, pulse 63, temperature 98.1 F (36.7 C), resp. rate 15, height 5\' 6"  (1.676 m), weight 89.4 kg (197 lb), SpO2 100 %.   General: Middle-aged female not in distress HEENT: Pupils reactive bilaterally, EOMI, no pallor, no icterus, moist oral mucosa, supple neck Chest: Clear to auscultation bilaterally CVS:  Normal S1 and S2, no murmurs rubs gallops GI: Soft, nondistended, nontender, bowel sounds present Musculoskeletal: Warm, no edema CNS: Alert and oriented x3, mild left facial droop, diminished sensation over the left side of the face, normal motor tone, power and reflex, normal sensation in the  extremities.  Gait not assessed.    Data Review:    CBC Recent Labs  Lab 07/06/17 0853  WBC 7.7  HGB 13.8  HCT 41.8  PLT 248  MCV 90.9  MCH 30.0  MCHC 33.0  RDW 12.6  LYMPHSABS 2.7  MONOABS 0.3  EOSABS 0.6  BASOSABS 0.0   ------------------------------------------------------------------------------------------------------------------  Chemistries  Recent Labs  Lab 07/06/17 0853  NA 139  K 3.6  CL 107  CO2 22  GLUCOSE 95  BUN 13  CREATININE 0.76  CALCIUM 9.0  AST 32  ALT 30  ALKPHOS 56  BILITOT 0.5   ------------------------------------------------------------------------------------------------------------------ estimated creatinine clearance is 108.4 mL/min (by C-G formula based on SCr of 0.76 mg/dL). ------------------------------------------------------------------------------------------------------------------ No results for input(s): TSH, T4TOTAL, T3FREE, THYROIDAB in the last 72 hours.  Invalid input(s): FREET3  Coagulation profile Recent Labs  Lab 07/06/17 0853  INR 0.96   ------------------------------------------------------------------------------------------------------------------- No results for input(s): DDIMER in the last 72 hours. -------------------------------------------------------------------------------------------------------------------  Cardiac Enzymes No results for input(s): CKMB, TROPONINI, MYOGLOBIN in the last 168 hours.  Invalid input(s): CK ------------------------------------------------------------------------------------------------------------------ No results found for:  BNP   ---------------------------------------------------------------------------------------------------------------  Urinalysis    Component Value Date/Time   COLORURINE YELLOW 07/06/2017 0837   APPEARANCEUR CLEAR 07/06/2017 0837   LABSPEC 1.010 07/06/2017 0837   PHURINE 5.5 07/06/2017 0837   GLUCOSEU NEGATIVE 07/06/2017 0837   HGBUR TRACE (A) 07/06/2017 0837   BILIRUBINUR NEGATIVE 07/06/2017 0837   KETONESUR NEGATIVE 07/06/2017 0837   PROTEINUR NEGATIVE 07/06/2017 0837   UROBILINOGEN 0.2 04/06/2009 1023   NITRITE NEGATIVE 07/06/2017 0837   LEUKOCYTESUR NEGATIVE 07/06/2017 0837    ----------------------------------------------------------------------------------------------------------------   Imaging Results:    Ct Head Wo Contrast  Result Date: 07/06/2017 CLINICAL DATA:  Slurred speech EXAM: CT HEAD WITHOUT CONTRAST TECHNIQUE: Contiguous axial images were obtained from the base of the skull through the vertex without intravenous contrast. COMPARISON:  MRI 04/07/2017 FINDINGS: Brain: Area of low-density noted in the posterior right temporal lobe and both frontal lobes concerning for acute to subacute infarcts. No hemorrhage or hydrocephalus. No midline shift. Vascular: No hyperdense vessel or unexpected calcification. Skull: No acute calvarial abnormality. Sinuses/Orbits: Visualized paranasal sinuses and mastoids clear. Orbital soft tissues unremarkable. Other: None IMPRESSION: Areas of low-density noted in the posterior right temporal lobe in both frontal lobes, new since prior MRI concerning for acute to subacute infarcts. No hemorrhage. Electronically Signed   By: Charlett Nose M.D.   On: 07/06/2017 09:49   Mr Laqueta Jean ZO Contrast  Result Date: 07/06/2017 CLINICAL DATA:  Slurred speech since Friday EXAM: MRI HEAD WITHOUT AND WITH CONTRAST TECHNIQUE: Multiplanar, multiecho pulse sequences of the brain and surrounding structures were obtained without and with intravenous contrast.  CONTRAST:  18mL MULTIHANCE GADOBENATE DIMEGLUMINE 529 MG/ML IV SOLN COMPARISON:  Head CT from earlier today.  Brain MRI 04/07/2017 FINDINGS: Brain: Multiple foci of somewhat ring-like restricted diffusion with neighboring edematous appearance and local gyral expansion. These are seen in the right superficial temporal, bilateral superficial frontal, and parasagittal left frontal lobes-the latter reaching the corpus callosum body. There is associated enhancement that has a horseshoe type appearance. No central restricted diffusion for abscess. No typical nodular enhancement for metastasis. A small focus of nodular white matter enhancement lateral to the frontal horn of the left lateral ventricle has subsided since comparison brain MR. There is also a nonenhancing signal abnormality about the temporal horn of the left lateral ventricle. The cortex is spared, not expected for multifocal peripheral subacute infarct. The  overall pattern is that of demyelination with tumefactive features, as with multiple sclerosis variant. There is no deep gray or brainstem involvement as commonly seen with ADEM, cerebritis, or Bechet's. Vascular: Major flow voids are preserved. Skull and upper cervical spine: No evidence of marrow lesion. Sinuses/Orbits: Negative IMPRESSION: Multiple areas of juxtacortical edema and enhancement, pattern strongly favoring tumefactive demyelination. Please see above. Electronically Signed   By: Marnee Spring M.D.   On: 07/06/2017 11:12    My personal review of EKG: Normal sinus rhythm at 73, no ST-T changes   Assessment & Plan:    Principal Problem:   Demyelinating changes in brain [facility]) MRI findings highly suspicious for multiple sclerosis.  Patient reports that her mother was diagnosed of having some demyelinating changes while in her 59s. Admit to MedSurg.  Ordered 1000 mg IV Solu-Medrol in the ED.  We will continue with daily high-dose IV Solu-Medrol for 5 days.  Follow further neurology  consult. Neurochecks.  PT/OT evaluation.  Patient cleared swallow evaluation in the ED. Protonix  for GI prophylaxis while on high-dose steroid.  Active Problems: History of stroke (HCC) Residual speech impairment and occasional swallowing dysfunction.  Also reports having some right-sided weakness.  Patient being referred to [facility] neurology for second opinion. MRI repeated on 04/07/2017 showing focus in the posterior left frontal lobe consistent with some chronic stroke.  30-day event monitoring was unremarkable.  TEE without PFO or thrombus. Continue aspirin, beta-blocker and statin.     DVT Prophylaxis subcu Lovenox  AM Labs Ordered, also please review Full Orders  Family Communication: Admission, patients condition and plan of care including tests being ordered have been discussed with the patient   Code Status full code  Likely DC to home  Condition: Fair  Consults called: Neurology  Admission status: Inpatient  Time spent in minutes : 50   Tesslyn Baumert M.D on 07/06/2017 at 2:48 PM  Between 7am to 7pm - Pager - [phone]. After 7pm go to [email or web address] - password [email or web address]  [facility] - Office  [phone]

## 2017-07-06 NOTE — Progress Notes (Signed)
Alert and oriented.  Stated had stroke in August of 2018 which left her with slower speech and sometimes trouble finding words but no limb deficits.  Today smile is asymmetrical with no smile on left. States has had this symptom since Friday.  No other deficits noted and able to ambulate independently.  Dr. Gonzella Lex has consulted Dr. Gerilyn Pilgrim and Dr. Gonzella Lex has told patient he doesn't think she is having a stroke but that it is possibly MS.  Family is at bedside.

## 2017-07-06 NOTE — ED Provider Notes (Signed)
Emergency Department Provider Note   I have reviewed the triage vital signs and the nursing notes.   HISTORY  Chief Complaint Aphasia and Jaw Pain   HPI ANJANA CHEEK is a 37 y.o. female with PMH of HLD, HTN, and prior CVA with ongoing slurred speech to the emergency department with 4 days of worsening slurred speech and left face droop.  Patient states that since her stroke, which occurred approximately in August 2018, she has had speech difficulty including word finding difficulty and slurred speech.  She states 4 days ago the symptoms worsen significantly.  She also appreciated some mild left face droop.  She describes intermittent, sharp pain in the left lateral neck which is also been going on since August and not significantly worsening.  She is experiencing some "soreness in my teeth" over the last 4 days.  She is having some difficulty swallowing.  She denies any unilateral numbness or weakness other than her face.  She will occasionally have vertigo sensation with walking which he states is not new since August 2018. She is followed by [facility] but apparently is being referred to [facility] for a second opinion but has not been seen by them yet.    Past Medical History:  Diagnosis Date  . Anxiety   . Cold   . Heart palpitations   . High cholesterol   . History of IBS   . Hypertension   . Stroke [facility])     Patient Active Problem List   Diagnosis Date Noted  . Speech disturbance 06/11/2017  . Stroke (HCC) 04/07/2017  . Numbness 03/31/2017  . Vertigo 03/31/2017  . Slurred speech 03/31/2017  . Visual disturbance 03/31/2017  . Anxiety 03/31/2017  . Gait disturbance 03/31/2017  . Leg swelling 12/30/2013  . Palpitations 09/16/2013  . Chest pain 09/16/2013    Past Surgical History:  Procedure Laterality Date  . ABDOMINAL HYSTERECTOMY    . BLADDER NECK SUSPENSION    . CHOLECYSTECTOMY    . COLONOSCOPY  01/23/2011   Procedure: COLONOSCOPY;  Surgeon:  Malissa Hippo, MD;  Location: [facility];  Service: Endoscopy;  Laterality: N/A;  . LAPAROSCOPIC TUBAL LIGATION    . TEE WITHOUT CARDIOVERSION N/A 05/05/2017   Procedure: TRANSESOPHAGEAL ECHOCARDIOGRAM (TEE);  Surgeon: Laqueta Linden, MD;  Location: [facility] SUITE;  Service: Cardiovascular;  Laterality: N/A;    Current Outpatient Rx  . Order #: [id number] Class: Historical Med  . Order #: [id number] Class: Normal  . Order #: [id number] Class: Historical Med  . Order #: [id number] Class: Historical Med  . Order #: [id number] Class: Normal  . Order #: [id number] Class: Normal    Allergies Adhesive [tape]; Darvocet [propoxyphene n-acetaminophen]; and Demerol  Family History  Problem Relation Age of Onset  . Diabetes Father   . Hypertension Father   . Colon polyps Father   . Lung cancer Father   . Hypertension Mother   . Coronary artery disease Mother   . COPD Mother     Social History Social History   Tobacco Use  . Smoking status: Former Smoker    Packs/day: 0.50    Years: 15.00    Pack years: 7.50    Types: Cigarettes    Last attempt to quit: 09/16/2012    Years since quitting: 4.8  . Smokeless tobacco: Never Used  Substance Use Topics  . Alcohol use: Yes    Alcohol/week: 0.6 oz    Types: 1 Shots of liquor per week    Comment: every  few months  . Drug use: No    Review of Systems  Constitutional: No fever/chills Eyes: No visual changes. ENT: No sore throat. Positive diffuse teeth soreness. Intermittent (chronic) left lateral neck pain.  Cardiovascular: Denies chest pain. Respiratory: Denies shortness of breath. Gastrointestinal: No abdominal pain.  No nausea, no vomiting.  No diarrhea.  No constipation. Genitourinary: Negative for dysuria. Musculoskeletal: Negative for back pain. Skin: Negative for rash. Neurological: Negative for headaches, focal weakness or numbness. Positive slurred speech and left face droop.   10-point ROS otherwise  negative.  ____________________________________________   PHYSICAL EXAM:  VITAL SIGNS: ED Triage Vitals  Enc Vitals Group     BP 07/06/17 0812 (!) 126/94     Pulse Rate 07/06/17 0810 65     Resp 07/06/17 0810 16     Temp 07/06/17 0810 98.1 F (36.7 C)     Temp Source 07/06/17 0810 Oral     SpO2 07/06/17 0810 100 %     Weight 07/06/17 0811 197 lb (89.4 kg)     Height 07/06/17 0811 5\' 6"  (1.676 m)     Pain Score 07/06/17 0811 1   Constitutional: Alert and oriented. Well appearing and in no acute distress. Eyes: Conjunctivae are normal. PERRL. EOMI. Head: Atraumatic. Nose: No congestion/rhinnorhea. Mouth/Throat: Mucous membranes are moist.  Oropharynx non-erythematous. No mandibular tenderness or swelling. Normal exam of the oropharynx. No dental abscess or PTA.  Neck: No stridor. Neck is supple.  Cardiovascular: Normal rate, regular rhythm. Good peripheral circulation. Grossly normal heart sounds.   Respiratory: Normal respiratory effort.  No retractions. Lungs CTAB. Gastrointestinal: Soft and nontender. No distention.  Musculoskeletal: No lower extremity tenderness nor edema. No gross deformities of extremities. Neurologic: Patient with mild/moderate dysarthria. Mild left face weakness with preserved forehead strength. No gross focal neurologic deficits are appreciated.  Skin:  Skin is warm, dry and intact. No rash noted.  ____________________________________________   LABS (all labs ordered are listed, but only abnormal results are displayed)  Labs Reviewed  RAPID URINE DRUG SCREEN, HOSP PERFORMED - Abnormal; Notable for the following components:      Result Value   Benzodiazepines POSITIVE (*)    All other components within normal limits  URINALYSIS, ROUTINE W REFLEX MICROSCOPIC - Abnormal; Notable for the following components:   Hgb urine dipstick TRACE (*)    All other components within normal limits  URINALYSIS, MICROSCOPIC (REFLEX) - Abnormal; Notable for the  following components:   Bacteria, UA MANY (*)    Squamous Epithelial / LPF TOO NUMEROUS TO COUNT (*)    All other components within normal limits  ETHANOL  PROTIME-INR  APTT  CBC  DIFFERENTIAL  COMPREHENSIVE METABOLIC PANEL  I-STAT TROPONIN, ED  I-STAT BETA HCG BLOOD, ED ([facility], [facility], [facility] ONLY)   ____________________________________________  EKG   EKG Interpretation  Date/Time:  Monday July 06 2017 08:14:32 EST Ventricular Rate:  73 PR Interval:    QRS Duration: 94 QT Interval:  384 QTC Calculation: 424 R Axis:   31 Text Interpretation:  Sinus rhythm Atrial premature complex Low voltage, precordial leads Abnormal R-wave progression, early transition Baseline wander in lead(s) V6 No STEMI.  Confirmed by Alona Bene [phone]) on 07/06/2017 8:24:43 AM       ____________________________________________  RADIOLOGY  Ct Head Wo Contrast  Result Date: 07/06/2017 CLINICAL DATA:  Slurred speech EXAM: CT HEAD WITHOUT CONTRAST TECHNIQUE: Contiguous axial images were obtained from the base of the skull through the vertex without intravenous contrast. COMPARISON:  MRI 04/07/2017 FINDINGS: Brain: Area of low-density noted in the posterior right temporal lobe and both frontal lobes concerning for acute to subacute infarcts. No hemorrhage or hydrocephalus. No midline shift. Vascular: No hyperdense vessel or unexpected calcification. Skull: No acute calvarial abnormality. Sinuses/Orbits: Visualized paranasal sinuses and mastoids clear. Orbital soft tissues unremarkable. Other: None IMPRESSION: Areas of low-density noted in the posterior right temporal lobe in both frontal lobes, new since prior MRI concerning for acute to subacute infarcts. No hemorrhage. Electronically Signed   By: Charlett Nose M.D.   On: 07/06/2017 09:49   Mr Laqueta Jean ZO Contrast  Result Date: 07/06/2017 CLINICAL DATA:  Slurred speech since Friday EXAM: MRI HEAD WITHOUT AND WITH CONTRAST TECHNIQUE: Multiplanar, multiecho pulse  sequences of the brain and surrounding structures were obtained without and with intravenous contrast. CONTRAST:  18mL MULTIHANCE GADOBENATE DIMEGLUMINE 529 MG/ML IV SOLN COMPARISON:  Head CT from earlier today.  Brain MRI 04/07/2017 FINDINGS: Brain: Multiple foci of somewhat ring-like restricted diffusion with neighboring edematous appearance and local gyral expansion. These are seen in the right superficial temporal, bilateral superficial frontal, and parasagittal left frontal lobes-the latter reaching the corpus callosum body. There is associated enhancement that has a horseshoe type appearance. No central restricted diffusion for abscess. No typical nodular enhancement for metastasis. A small focus of nodular white matter enhancement lateral to the frontal horn of the left lateral ventricle has subsided since comparison brain MR. There is also a nonenhancing signal abnormality about the temporal horn of the left lateral ventricle. The cortex is spared, not expected for multifocal peripheral subacute infarct. The overall pattern is that of demyelination with tumefactive features, as with multiple sclerosis variant. There is no deep gray or brainstem involvement as commonly seen with ADEM, cerebritis, or Bechet's. Vascular: Major flow voids are preserved. Skull and upper cervical spine: No evidence of marrow lesion. Sinuses/Orbits: Negative IMPRESSION: Multiple areas of juxtacortical edema and enhancement, pattern strongly favoring tumefactive demyelination. Please see above. Electronically Signed   By: Marnee Spring M.D.   On: 07/06/2017 11:12    ____________________________________________   PROCEDURES  Procedure(s) performed:   Procedures  None ____________________________________________   INITIAL IMPRESSION / ASSESSMENT AND PLAN / ED COURSE  Pertinent labs & imaging results that were available during my care of the patient were reviewed by me and considered in my medical decision making  (see chart for details).  Patient presents to the emergency department with acute on chronic slurred speech with new left face droop.  No pronator drift.  Patient has MRI from November showing subacute stroke.  No headaches to increase suspicion for complex migraine.  Concerned the patient may have had a worsening or additional stroke.  Plan for labs, CT, and neurology consultation  Spoke with Dr. Gerilyn Pilgrim who will evaluate the patient. Recommends 1000 mg Solumedrol. Doubt mets given MRI read. Updated the patient and family regarding the MRI results.   Discussed patient's case with Hospitalist to request admission. Patient and family (if present) updated with plan. Care transferred to Hospitalist service.  I reviewed all nursing notes, vitals, pertinent old records, EKGs, labs, imaging (as available).  ____________________________________________  FINAL CLINICAL IMPRESSION(S) / ED DIAGNOSES  Final diagnoses:  MS (multiple sclerosis) (HCC)     MEDICATIONS GIVEN DURING THIS VISIT:  Medications  methylPREDNISolone sodium succinate (SOLU-MEDROL) 1,000 mg in sodium chloride 0.9 % 50 mL IVPB (not administered)  gadobenate dimeglumine (MULTIHANCE) injection 20 mL (18 mLs Intravenous Contrast Given 07/06/17 1021)    Note:  This document was prepared using Dragon voice recognition software and may include unintentional dictation errors.  Alona Bene, MD Emergency Medicine    Antonela Freiman, Arlyss Repress, MD 07/06/17 1320

## 2017-07-06 NOTE — ED Notes (Signed)
Hospitalist at bedside. Verbal order given to give meal tray

## 2017-07-07 ENCOUNTER — Telehealth: Payer: Self-pay | Admitting: Neurology

## 2017-07-07 ENCOUNTER — Inpatient Hospital Stay (HOSPITAL_COMMUNITY): Payer: BLUE CROSS/BLUE SHIELD

## 2017-07-07 DIAGNOSIS — G9389 Other specified disorders of brain: Secondary | ICD-10-CM

## 2017-07-07 LAB — CRYPTOCOCCAL ANTIGEN, CSF: CRYPTO AG: NEGATIVE

## 2017-07-07 LAB — PROTEIN AND GLUCOSE, CSF
GLUCOSE CSF: 80 mg/dL — AB (ref 40–70)
Total  Protein, CSF: 33 mg/dL (ref 15–45)

## 2017-07-07 LAB — C-REACTIVE PROTEIN

## 2017-07-07 LAB — CSF CELL COUNT WITH DIFFERENTIAL
RBC Count, CSF: 2 /mm3 — ABNORMAL HIGH
TUBE #: 4
WBC CSF: 3 /mm3 (ref 0–5)

## 2017-07-07 LAB — VITAMIN B12: Vitamin B-12: 280 pg/mL (ref 180–914)

## 2017-07-07 LAB — PROTEIN, CSF: Total  Protein, CSF: 34 mg/dL (ref 15–45)

## 2017-07-07 LAB — HIV ANTIBODY (ROUTINE TESTING W REFLEX): HIV SCREEN 4TH GENERATION: NONREACTIVE

## 2017-07-07 MED ORDER — KETOROLAC TROMETHAMINE 15 MG/ML IJ SOLN
15.0000 mg | Freq: Once | INTRAMUSCULAR | Status: AC
  Administered 2017-07-07: 15 mg via INTRAVENOUS
  Filled 2017-07-07: qty 1

## 2017-07-07 MED ORDER — BUSPIRONE HCL 5 MG PO TABS
5.0000 mg | ORAL_TABLET | Freq: Two times a day (BID) | ORAL | Status: DC
  Administered 2017-07-07 – 2017-07-08 (×2): 5 mg via ORAL
  Filled 2017-07-07 (×2): qty 1

## 2017-07-07 NOTE — Consult Note (Signed)
[place] A. Merlene Laughter, MD     [email or web address]          Lindsey Phelps is an 37 y.o. female.   ASSESSMENT/PLAN: 1. Acute dysarthria on a more subacute dysarthria and the gait impairment right leg weakness: MRI is concerning for multiple potential etiologies including primary CNS vasculitis, systemic vasculitis, metastatic disease, infectious processes, primary CNS malignancy, demyelinating disease and embolic strokes. The patient has been started on Solu-Medrol. We will do this for 3 days and assess improvement in the patient. Spinal tap will be done to include cytology amongst other testing. Extensive labs will be done including HIV, RPR, sedimentation rate, C-reactive protein and ANA. Ultimately, the patient may need to have brain biopsy to known definitively what the etiology. We will likely arrange for this to be done in [place] once she is discharged. This is discussed at length with the patient and her  Husband and the mother.  They would like for Dr. Vertell Limber to be the  neurosurgeon to see the patient for potential on biopsy.      She reports that her speech has improved.  She is tolerating the steroids.  Spinal tap is done and results are outlined below.  So far the results have been unrevealing but the important tests are still pending a likely will not be back until later this week.  Family is at bedside and had extensive discussions with them about where to proceed from here.       GNA 06/2017 I first saw her 03/31/2017.   Was concerned about her word finding difficulties and an MRI of the brain was performed. It showed a subacute left frontal stroke. There were a few other punctate T2/FLAIR hyperintense foci consistent with minimal chronic microvascular ischemic change.   I was most concerned about an embolic etiology. The carotid ultrasound was essentially normal. The cardiac event monitor was essentially normal.  A TEE showed a normal ejection fraction of  60-65 percent.   There was no evidence of a patent foramen ovale or other right-to-left shunt. The study was felt to be normal.  She is on aspirin, metoprolol and Crestor.  Here to the previous visit, she feels her speech is not much  Better abd she still has some aphasia and feels she needs to concentrate more with her speech.  She feels her right arm is still clumsy.   She has noted she spells words wrong a lot and her typing is slowed with more errors.   She still has some slurring. There are no new symptoms.l;  She also has some anxiety and BuSpar was started.   It helped some but may have contributed to dizziness and headaches so the dose was decreased to 5 mg twice a day.    Headaches are less frequent in general (she was having more at last visit).   She will get occasional sharp pains.     GNA 10 -2018 She is a 37 yo woman who had an episode with chest pain and bilateral arm numbness on 01/27/2016 and was taken to the ED (workup was normal).  She who noted numbness in her jaw at night about 5 months ago when starting to fall asleep.  In August 2018, she had another episode of chest tightness and arm numbness and went to an ED.   Since then, she has noted more symptoms such as slurred speech, verbal fluency issues, leg weakness and balance issues.    These symptoms mostly come and  go for 10 - 30 minutes and now occur daily.   They occur randomly and in any position (sitting, standing, walking).    She also notes vision is blurry and she has episodes of worsening blurry vision, also lasting 10-30 minutes.   She has vertigo that is mild but constant.   It intensifies for a few minutes at a time sometimes while she is walking.      She has had panic attacks since 2016 after her brother was in a serious accident but she feels current symptoms are different.  She takes valium prn, just a few a month.  Many years ago, she was on Celexa.   She continues to note episodes of palpitations and tachycardia.    Metoprolol was started with benefit.         GENERAL: Patient is mildly overweight and appears somewhat slow and in discomfort but no acute distress.  HEENT: Neck is supple.  ABDOMEN: soft  EXTREMITIES: No edema   BACK: This is normal.  SKIN: Normal by inspection.    MENTAL STATUS: Alert and oriented. Speech is mildly dysarthric, language and cognition are generally intact. Judgment and insight normal.   CRANIAL NERVES: Pupils are equal, round and reactive to light and accomodation; extra ocular movements are full, there is no significant nystagmus; visual fields are full; upper and lower facial muscles are normal in strength and symmetric, there is no flattening of the nasolabial folds; tongue is midline; uvula is midline; shoulder elevation is normal.  MOTOR: Upper extremity shows normal tone, bulk and strength. There is mild right leg weakness graded as 4+/5 on hip flexion. Dorsiflexion is 5. Left leg is normal.  COORDINATION: Left finger to nose is normal, right finger to nose is normal, No rest tremor; no intention tremor; no postural tremor; no bradykinesia.  REFLEXES: Deep tendon reflexes are symmetrical and normal.   SENSATION: Normal to light touch, temperature, and pain.  GAIT: She ambulates by holding and gait is somewhat mildly unsteady.       Blood pressure 128/85, pulse 96, temperature 98 F (36.7 C), temperature source Oral, resp. rate 16, height _0  (1.676 m), weight 197 lb (89.4 kg), SpO2 100 %.  Past Medical History:  Diagnosis Date  . Anxiety   . Cold   . Heart palpitations   . High cholesterol   . History of IBS   . Hypertension   . Stroke [facility])     Past Surgical History:  Procedure Laterality Date  . ABDOMINAL HYSTERECTOMY    . BLADDER NECK SUSPENSION    . CHOLECYSTECTOMY    . COLONOSCOPY  01/23/2011   Procedure: COLONOSCOPY;  Surgeon: Rogene Houston, MD;  Location: [facility];  Service: Endoscopy;  Laterality: N/A;  . LAPAROSCOPIC  TUBAL LIGATION    . TEE WITHOUT CARDIOVERSION N/A 05/05/2017   Procedure: TRANSESOPHAGEAL ECHOCARDIOGRAM (TEE);  Surgeon: Herminio Commons, MD;  Location: [facility];  Service: Cardiovascular;  Laterality: N/A;    Family History  Problem Relation Age of Onset  . Diabetes Father   . Hypertension Father   . Colon polyps Father   . Lung cancer Father   . Hypertension Mother   . Coronary artery disease Mother   . COPD Mother     Social History:  reports that she quit smoking about 4 years ago. Her smoking use included cigarettes. She has a 7.50 pack-year smoking history. she has never used smokeless tobacco. She reports that she drinks about  0.6 oz of alcohol per week. She reports that she does not use drugs.  Allergies:  Allergies  Allergen Reactions  . Adhesive [Tape]     Including bandaids  . Darvocet [Propoxyphene N-Acetaminophen]   . Demerol     Medications: Prior to Admission medications   Medication Sig Start Date End Date Taking? Authorizing Provider  aspirin EC 81 MG tablet Take 81 mg daily by mouth.   Yes [provider]  busPIRone (BUSPAR) 15 MG tablet Take 1 tablet (15 mg total) by mouth 2 (two) times daily. 03/31/17  Yes Sater, Nanine Means, MD  diazepam (VALIUM) 10 MG tablet Take 10 mg by mouth at bedtime as needed for anxiety.    Yes [provider]  loratadine (CLARITIN) 10 MG tablet Take 10 mg by mouth daily.   Yes [provider]  metoprolol tartrate (LOPRESSOR) 25 MG tablet TAKE ONE TABLET BY MOUTH TWICE DAILY 04/27/17  Yes Herminio Commons, MD  rosuvastatin (CRESTOR) 10 MG tablet Take 1 tablet (10 mg total) daily by mouth. 04/20/17 07/19/17 Yes Herminio Commons, MD    Scheduled Meds: . aspirin EC  81 mg Oral Daily  . busPIRone  5 mg Oral BID  . loratadine  10 mg Oral Daily  . metoprolol tartrate  25 mg Oral BID  . pantoprazole  40 mg Oral Daily  . rosuvastatin  10 mg Oral q1800   Continuous Infusions: . sodium  chloride Stopped (07/07/17 1754)  . methylPREDNISolone (SOLU-MEDROL) injection Stopped (07/07/17 1332)   PRN Meds:.diazepam, ibuprofen, ondansetron **OR** ondansetron (ZOFRAN) IV     Results for orders placed or performed during the hospital encounter of 07/06/17 (from the past 48 hour(s))  Urine rapid drug screen (hosp performed)     Status: Abnormal   Collection Time: 07/06/17  8:37 AM  Result Value Ref Range   Opiates NONE DETECTED NONE DETECTED   Cocaine NONE DETECTED NONE DETECTED   Benzodiazepines POSITIVE (A) NONE DETECTED   Amphetamines NONE DETECTED NONE DETECTED   Tetrahydrocannabinol NONE DETECTED NONE DETECTED   Barbiturates NONE DETECTED NONE DETECTED    Comment: (NOTE) DRUG SCREEN FOR MEDICAL PURPOSES ONLY.  IF CONFIRMATION IS NEEDED FOR ANY PURPOSE, NOTIFY LAB WITHIN 5 DAYS. LOWEST DETECTABLE LIMITS FOR URINE DRUG SCREEN Drug Class                     Cutoff (ng/mL) Amphetamine and metabolites    1000 Barbiturate and metabolites    200 Benzodiazepine                 470 Tricyclics and metabolites     300 Opiates and metabolites        300 Cocaine and metabolites        300 THC                            50 Performed at [facility], [address], [place], [place] [address]   Urinalysis, Routine w reflex microscopic     Status: Abnormal   Collection Time: 07/06/17  8:37 AM  Result Value Ref Range   Color, Urine YELLOW YELLOW   APPearance CLEAR CLEAR   Specific Gravity, Urine 1.010 1.005 - 1.030   pH 5.5 5.0 - 8.0   Glucose, UA NEGATIVE NEGATIVE mg/dL   Hgb urine dipstick TRACE (A) NEGATIVE   Bilirubin Urine NEGATIVE NEGATIVE   Ketones, ur NEGATIVE NEGATIVE mg/dL  Protein, ur NEGATIVE NEGATIVE mg/dL   Nitrite NEGATIVE NEGATIVE   Leukocytes, UA NEGATIVE NEGATIVE    Comment: Performed at [facility], [address] [place], [place] [address]  Urinalysis, Microscopic (reflex)     Status: Abnormal   Collection Time: 07/06/17  8:37 AM  Result Value Ref  Range   RBC / HPF 0-5 0 - 5 RBC/hpf   WBC, UA NONE SEEN 0 - 5 WBC/hpf   Bacteria, UA MANY (A) NONE SEEN   Squamous Epithelial / LPF TOO NUMEROUS TO COUNT (A) NONE SEEN    Comment: Performed at [facility], [address], [place], [place] [address]  Ethanol     Status: None   Collection Time: 07/06/17  8:50 AM  Result Value Ref Range   Alcohol, Ethyl (B) <10 <10 mg/dL    Comment:        LOWEST DETECTABLE LIMIT FOR SERUM ALCOHOL IS 10 mg/dL FOR MEDICAL PURPOSES ONLY Performed at [facility], [address], [place], [place] [address]   Protime-INR     Status: None   Collection Time: 07/06/17  8:53 AM  Result Value Ref Range   Prothrombin Time 12.7 11.4 - 15.2 seconds   INR 0.96     Comment: Performed at [facility], [address], [place], [place] [address]  APTT     Status: None   Collection Time: 07/06/17  8:53 AM  Result Value Ref Range   aPTT 32 24 - 36 seconds    Comment: Performed at [facility], [address] [place], [place] [address]  CBC     Status: None   Collection Time: 07/06/17  8:53 AM  Result Value Ref Range   WBC 7.7 4.0 - 10.5 K/uL   RBC 4.60 3.87 - 5.11 MIL/uL   Hemoglobin 13.8 12.0 - 15.0 g/dL   HCT 41.8 36.0 - 46.0 %   MCV 90.9 78.0 - 100.0 fL   MCH 30.0 26.0 - 34.0 pg   MCHC 33.0 30.0 - 36.0 g/dL   RDW 12.6 11.5 - 15.5 %   Platelets 248 150 - 400 K/uL    Comment: Performed at [facility], [address], [place], [place] [address]  Differential     Status: None   Collection Time: 07/06/17  8:53 AM  Result Value Ref Range   Neutrophils Relative % 53 %   Neutro Abs 4.1 1.7 - 7.7 K/uL   Lymphocytes Relative 35 %   Lymphs Abs 2.7 0.7 - 4.0 K/uL   Monocytes Relative 4 %   Monocytes Absolute 0.3 0.1 - 1.0 K/uL   Eosinophils Relative 8 %   Eosinophils Absolute 0.6 0.0 - 0.7 K/uL   Basophils Relative 0 %   Basophils Absolute 0.0 0.0 - 0.1 K/uL    Comment: Performed at [facility], [address] [place], [place] [address]  Comprehensive metabolic  panel     Status: None   Collection Time: 07/06/17  8:53 AM  Result Value Ref Range   Sodium 139 135 - 145 mmol/L   Potassium 3.6 3.5 - 5.1 mmol/L   Chloride 107 101 - 111 mmol/L   CO2 22 22 - 32 mmol/L   Glucose, Bld 95 65 - 99 mg/dL   BUN 13 6 - 20 mg/dL   Creatinine, Ser 0.76 0.44 - 1.00 mg/dL   Calcium 9.0 8.9 - 10.3 mg/dL   Total Protein 7.4 6.5 - 8.1 g/dL   Albumin 4.4 3.5 - 5.0 g/dL   AST  32 15 - 41 U/L   ALT 30 14 - 54 U/L   Alkaline Phosphatase 56 38 - 126 U/L   Total Bilirubin 0.5 0.3 - 1.2 mg/dL   GFR calc non Af Amer >60 >60 mL/min   GFR calc Af Amer >60 >60 mL/min    Comment: (NOTE) The eGFR has been calculated using the CKD EPI equation. This calculation has not been validated in all clinical situations. eGFR's persistently <60 mL/min signify possible Chronic Kidney Disease.    Anion gap 10 5 - 15    Comment: Performed at [facility] [address], [place], [place] [address]  I-stat troponin, ED     Status: None   Collection Time: 07/06/17  8:59 AM  Result Value Ref Range   Troponin i, poc 0.00 0.00 - 0.08 ng/mL   Comment 3            Comment: Due to the release kinetics of cTnI, a negative result within the first hours of the onset of symptoms does not rule out myocardial infarction with certainty. If myocardial infarction is still suspected, repeat the test at appropriate intervals.   I-Stat beta hCG blood, ED     Status: None   Collection Time: 07/06/17  8:59 AM  Result Value Ref Range   I-stat hCG, quantitative <5.0 <5 mIU/mL   Comment 3            Comment:   GEST. AGE      CONC.  (mIU/mL)   <=1 WEEK        5 - 50     2 WEEKS       50 - 500     3 WEEKS       100 - 10,000     4 WEEKS     1,000 - 30,000        FEMALE AND NON-PREGNANT FEMALE:     LESS THAN 5 mIU/mL   HIV antibody (Routine Testing)     Status: None   Collection Time: 07/06/17  5:15 PM  Result Value Ref Range   HIV Screen 4th Generation wRfx Non Reactive Non Reactive    Comment:  (NOTE) Performed At: [facility] [place], [place] [id number] Rush Farmer MD NW:[id number] Performed at [facility], [address] [place], [place] [address]   Vitamin B12     Status: None   Collection Time: 07/06/17  8:33 PM  Result Value Ref Range   Vitamin B-12 280 180 - 914 pg/mL    Comment: (NOTE) This assay is not validated for testing neonatal or myeloproliferative syndrome specimens for Vitamin B12 levels. Performed at [facility], [place] [address], [place], [place] [address]   C-reactive protein     Status: None   Collection Time: 07/06/17  8:33 PM  Result Value Ref Range   CRP <0.8 <1.0 mg/dL    Comment: Performed at [facility], [place] [address] [place], [place] [address]  Sedimentation rate     Status: None   Collection Time: 07/06/17  8:33 PM  Result Value Ref Range   Sed Rate 15 0 - 22 mm/hr    Comment: Performed at [facility], [address] [place],  [address]  Protein, CSF     Status: None   Collection Time: 07/07/17  9:25 AM  Result Value Ref Range   Total  Protein, CSF 34 15 - 45 mg/dL    Comment: Performed at [facility], [address] [place], [place] [address]  CSF cell count with differential     Status: Abnormal   Collection Time: 07/07/17  9:25 AM  Result Value Ref Range   Tube # 4    Color, CSF COLORLESS COLORLESS   Appearance, CSF CLEAR CLEAR   Supernatant COLORLESS    RBC Count, CSF 2 (H) 0 /cu mm   WBC, CSF 3 0 - 5 /cu mm   Segmented Neutrophils-CSF TOO FEW TO COUNT, SMEAR AVAILABLE FOR REVIEW 0 - 6 %   Lymphs, CSF TOO FEW TO COUNT, SMEAR AVAILABLE FOR REVIEW 40 - 80 %   Monocyte-Macrophage-Spinal Fluid TOO FEW TO COUNT, SMEAR AVAILABLE FOR REVIEW 15 - 45 %   Eosinophils, CSF TOO FEW TO COUNT, SMEAR AVAILABLE FOR REVIEW 0 - 1 %   Other Cells, CSF TOO FEW TO COUNT, SMEAR AVAILABLE FOR REVIEW     Comment: Performed at [facility] [address] [place], [place] [address]  CSF culture      Status: None (Preliminary result)   Collection Time: 07/07/17  9:25 AM  Result Value Ref Range   Specimen Description CSF    Special Requests NONE    Gram Stain      CYTOSPIN SMEAR WBC PRESENT, PREDOMINANTLY MONONUCLEAR NO ORGANISMS SEEN Performed at [facility] Performed at [facility], [address], [place], [place] [address]    Culture PENDING    Report Status PENDING   Cryptococcal antigen, CSF     Status: None   Collection Time: 07/07/17  9:25 AM  Result Value Ref Range   Crypto Ag NEGATIVE NEGATIVE   Cryptococcal Ag Titer NOT INDICATED NOT INDICATED    Comment: Performed at [facility], [place] [address], [place], [place] [address]  Protein and glucose, CSF     Status: Abnormal   Collection Time: 07/07/17  9:54 AM  Result Value Ref Range   Glucose, CSF 80 (H) 40 - 70 mg/dL   Total  Protein, CSF 33 15 - 45 mg/dL    Comment: Performed at [facility], [address], [place], [place] [address]    Studies/Results:    FINDINGS: Brain: Multiple foci of somewhat ring-like restricted diffusion with neighboring edematous appearance and local gyral expansion. These are seen in the right superficial temporal, bilateral superficial frontal, and parasagittal left frontal lobes-the latter reaching the corpus callosum body. There is associated enhancement that has a horseshoe type appearance. No central restricted diffusion for abscess. No typical nodular enhancement for metastasis. A small focus of nodular white matter enhancement lateral to the frontal horn of the left lateral ventricle has subsided since comparison brain MR. There is also a nonenhancing signal abnormality about the temporal horn of the left lateral ventricle. The cortex is spared, not expected for multifocal peripheral subacute infarct. The overall pattern is that of demyelination with tumefactive features, as with multiple sclerosis variant. There is no deep gray or brainstem involvement as  commonly seen with ADEM, cerebritis, or Bechet's.  Vascular: Major flow voids are preserved.  Skull and upper cervical spine: No evidence of marrow lesion.  Sinuses/Orbits: Negative  IMPRESSION: Multiple areas of juxtacortical edema and enhancement, pattern strongly favoring tumefactive demyelination. Please see above.    The brain MRI scan is reviewed in person. There are large white matter lesions approximately 5. Three involves the right hemisphere and two on the left hemisphere. These are seen on FLAIR imaging are mostly limited to the white matter. T1  shows reduced signal involving the R temporal and one of the right frontal regions. Lesions enhance weightbearing degrees. Only the left frontal lesion was seen on the scan done November 2018. This lesion also enhances in a semi-circular fashion.     Emmanuelle Hibbitts A. Merlene Laughter, M.D.  Diplomate, Tax adviser of Psychiatry and Neurology ( Neurology). 07/07/2017, 11:48 PM

## 2017-07-07 NOTE — Telephone Encounter (Signed)
Pt called she said last Friday she began having a droop on the left side of the face, slurred speech. She called PCP yesterday and was advised to go to [facility]. She has been admitted and has had several test-CT, MRI and labs. MRI showed 3 large lesions on the brain and was told she could have cancer, MS or another stroke. She is wanting Dr Epimenio Foot to look at the MRI and "see what he thinks". Pt is aware she under the care of the hospitalist.

## 2017-07-07 NOTE — Evaluation (Signed)
Occupational Therapy Evaluation Patient Details Name: Lindsey Phelps MRN: [id number] DOB: 01-04-1981 Today's Date: 07/07/2017    History of Present Illness Babbette Dalesandro  is a 37 y.o. female, 37 year old female with history of hypertension, hyperlipidemia, CVA diagnosed in August 2018 with ongoing slurred speech and some residual right-sided weakness presented to the ED with 4-day history of worsened left-sided facial droop and slurred speech.  Patient reports that she has residual speech impairment with frequent word finding difficulty and some slurring since her stroke last year.  4 days back she noticed mild left-sided facial droop with worsened speech.  This was associated with sharp pain in her left side of the neck.  She also reports off and on difficulty swallowing.   Clinical Impression   Pt received supine in bed, agreeable to OT evaluation. Pt demonstrate BUE strength WNL, R<L due to prior CVA in 2018. Coordination and sensation are intact. Pt is independent with B/ADLs this am, reports only issue has been with speech. No further OT services required at this time.     Follow Up Recommendations  No OT follow up    Equipment Recommendations  None recommended by OT       Precautions / Restrictions Precautions Precautions: None Restrictions Weight Bearing Restrictions: No      Mobility Bed Mobility Overal bed mobility: Independent                Transfers Overall transfer level: Independent                        ADL either performed or assessed with clinical judgement   ADL Overall ADL's : Independent                                             Vision Baseline Vision/History: Wears glasses Wears Glasses: At all times Patient Visual Report: No change from baseline Vision Assessment?: No apparent visual deficits            Pertinent Vitals/Pain Pain Assessment: No/denies pain     Hand Dominance Right   Extremity/Trunk  Assessment Upper Extremity Assessment Upper Extremity Assessment: Overall WFL for tasks assessed   Lower Extremity Assessment Lower Extremity Assessment: Defer to PT evaluation   Cervical / Trunk Assessment Cervical / Trunk Assessment: Normal   Communication Communication Communication: No difficulties   Cognition Arousal/Alertness: Awake/alert Behavior During Therapy: WFL for tasks assessed/performed Overall Cognitive Status: Within Functional Limits for tasks assessed                                                Home Living Family/patient expects to be discharged to:: Private residence Living Arrangements: Spouse/significant other Available Help at Discharge: Family;Available PRN/intermittently Type of Home: House Home Access: Stairs to enter [employer] of Steps: 2 Entrance Stairs-Rails: None Home Layout: One level     Bathroom Shower/Tub: Producer, television/film/video: Standard     Home Equipment: None          Prior Functioning/Environment Level of Independence: Independent                  End of Session    Activity Tolerance: Patient tolerated treatment well Patient left:  in bed;with call bell/phone within reach  OT Visit Diagnosis: Muscle weakness (generalized) (M62.81)                Time: 1610-9604 OT Time Calculation (min): 18 min Charges:  OT General Charges $OT Visit: 1 Visit OT Evaluation $OT Eval Low Complexity: 1 Low   Ezra Sites, OTR/L  [phone] 07/07/2017, 8:20 AM

## 2017-07-07 NOTE — Progress Notes (Signed)
PROGRESS NOTE                                                                                                                                                                                                             Patient Demographics:    Lindsey Phelps, is a 37 y.o. female, DOB - November 20, 1980, ZOX:[id number]  Admit date - 07/06/2017   Admitting Physician Eddie North, MD  Outpatient Primary MD for the patient is Elfredia Nevins, MD  LOS - 1  Outpatient Specialists: Dr. Epimenio Foot (neurology)  Chief Complaint  Patient presents with  . Aphasia  . Jaw Pain       Brief Narrative 37 year old female with hypertension, hyperlipidemia, CVA in August 2018 with ongoing slurred speech and some right-sided weakness presented to the ED with 4-day history of worsening left-sided facial droop and slurred speech.  Reports mother being diagnosed of some demyelinating disease when she was in her 78s. MRI brain showing areas of demyelination. Patient started on high-dose IV Solu-Medrol and neurology consulted.   Subjective:   Facial droop unchanged.  Still reports some speech impairment.  Complains of headache. had LP done this morning.   Assessment  & Plan :    Principal Problem:   Demyelinating changes in brain [facility]) Differential includes multiple sclerosis versus vasculitis.  On 3 days of high-dose IV Solu-Medrol (last dose tomorrow).  LP done and labs sent for cell count, differential, cultures, proteins, oligoclonal bands and cytology. Neurology consult appreciated.  Recommends patient ultimately may need brain biopsy for definite etiology.  Active Problems:   Stroke [facility]) Residual dysarthria and some right-sided weakness.  Had TEE which was unremarkable.  Follows with Dr. Epimenio Foot who has referred her to [facility] neurology for second opinion.  Also on bupropion for anxiety and?  Depression.  Continue aspirin, beta-blocker and  statin.       Code Status : Full code  Family Communication  : Husband at bedside  Disposition Plan  : Home tomorrow after third dose of IV Solu-Medrol  Barriers For Discharge : Active symptoms  Consults  : Neurology  Procedures  : CT head, MRI brain, LP  DVT Prophylaxis  :  Lovenox -   Lab Results  Component Value Date   PLT 248 07/06/2017    Antibiotics  :   Anti-infectives (From admission, onward)  None        Objective:   Vitals:   07/06/17 1830 07/06/17 2131 07/07/17 0700 07/07/17 1032  BP: 138/76 125/72 (!) 105/58 106/62  Pulse: (!) 106 (!) 104 85 88  Resp: 16 15 18    Temp: 98 F (36.7 C) 97.9 F (36.6 C) 98.2 F (36.8 C)   TempSrc: Oral Oral Oral   SpO2: 95% 95% 98% 98%  Weight:      Height:        Wt Readings from Last 3 Encounters:  07/06/17 89.4 kg (197 lb)  07/02/17 90.3 kg (199 lb)  06/11/17 89.6 kg (197 lb 8 oz)     Intake/Output Summary (Last 24 hours) at 07/07/2017 1127 Last data filed at 07/07/2017 0900 Gross per 24 hour  Intake 0 ml  Output -  Net 0 ml     Physical Exam  Gen: not in distress HEENT: moist mucosa, supple neck Chest: clear b/l, no added sounds CVS: N S1&S2, no murmurs,  GI: soft, NT, ND, BS+ Musculoskeletal: warm, no edema CNS: Alert and oriented, left facial droop, no dysarthria, mild right-weakness.    Data Review:    CBC Recent Labs  Lab 07/06/17 0853  WBC 7.7  HGB 13.8  HCT 41.8  PLT 248  MCV 90.9  MCH 30.0  MCHC 33.0  RDW 12.6  LYMPHSABS 2.7  MONOABS 0.3  EOSABS 0.6  BASOSABS 0.0    Chemistries  Recent Labs  Lab 07/06/17 0853  NA 139  K 3.6  CL 107  CO2 22  GLUCOSE 95  BUN 13  CREATININE 0.76  CALCIUM 9.0  AST 32  ALT 30  ALKPHOS 56  BILITOT 0.5   ------------------------------------------------------------------------------------------------------------------ No results for input(s): CHOL, HDL, LDLCALC, TRIG, CHOLHDL, LDLDIRECT in the last 72 hours.  No results  found for: HGBA1C ------------------------------------------------------------------------------------------------------------------ No results for input(s): TSH, T4TOTAL, T3FREE, THYROIDAB in the last 72 hours.  Invalid input(s): FREET3 ------------------------------------------------------------------------------------------------------------------ No results for input(s): VITAMINB12, FOLATE, FERRITIN, TIBC, IRON, RETICCTPCT in the last 72 hours.  Coagulation profile Recent Labs  Lab 07/06/17 0853  INR 0.96    No results for input(s): DDIMER in the last 72 hours.  Cardiac Enzymes No results for input(s): CKMB, TROPONINI, MYOGLOBIN in the last 168 hours.  Invalid input(s): CK ------------------------------------------------------------------------------------------------------------------ No results found for: BNP  Inpatient Medications  Scheduled Meds: . aspirin EC  81 mg Oral Daily  . busPIRone  5 mg Oral BID  . loratadine  10 mg Oral Daily  . metoprolol tartrate  25 mg Oral BID  . pantoprazole  40 mg Oral Daily  . rosuvastatin  10 mg Oral q1800   Continuous Infusions: . sodium chloride    . methylPREDNISolone (SOLU-MEDROL) injection     PRN Meds:.diazepam, ibuprofen, ondansetron **OR** ondansetron (ZOFRAN) IV  Micro Results No results found for this or any previous visit (from the past 240 hour(s)).  Radiology Reports Ct Head Wo Contrast  Result Date: 07/06/2017 CLINICAL DATA:  Slurred speech EXAM: CT HEAD WITHOUT CONTRAST TECHNIQUE: Contiguous axial images were obtained from the base of the skull through the vertex without intravenous contrast. COMPARISON:  MRI 04/07/2017 FINDINGS: Brain: Area of low-density noted in the posterior right temporal lobe and both frontal lobes concerning for acute to subacute infarcts. No hemorrhage or hydrocephalus. No midline shift. Vascular: No hyperdense vessel or unexpected calcification. Skull: No acute calvarial abnormality.  Sinuses/Orbits: Visualized paranasal sinuses and mastoids clear. Orbital soft tissues unremarkable. Other: None IMPRESSION: Areas of low-density noted  in the posterior right temporal lobe in both frontal lobes, new since prior MRI concerning for acute to subacute infarcts. No hemorrhage. Electronically Signed   By: Charlett Nose M.D.   On: 07/06/2017 09:49   Mr Laqueta Jean SW Contrast  Result Date: 07/06/2017 CLINICAL DATA:  Slurred speech since Friday EXAM: MRI HEAD WITHOUT AND WITH CONTRAST TECHNIQUE: Multiplanar, multiecho pulse sequences of the brain and surrounding structures were obtained without and with intravenous contrast. CONTRAST:  63mL MULTIHANCE GADOBENATE DIMEGLUMINE 529 MG/ML IV SOLN COMPARISON:  Head CT from earlier today.  Brain MRI 04/07/2017 FINDINGS: Brain: Multiple foci of somewhat ring-like restricted diffusion with neighboring edematous appearance and local gyral expansion. These are seen in the right superficial temporal, bilateral superficial frontal, and parasagittal left frontal lobes-the latter reaching the corpus callosum body. There is associated enhancement that has a horseshoe type appearance. No central restricted diffusion for abscess. No typical nodular enhancement for metastasis. A small focus of nodular white matter enhancement lateral to the frontal horn of the left lateral ventricle has subsided since comparison brain MR. There is also a nonenhancing signal abnormality about the temporal horn of the left lateral ventricle. The cortex is spared, not expected for multifocal peripheral subacute infarct. The overall pattern is that of demyelination with tumefactive features, as with multiple sclerosis variant. There is no deep gray or brainstem involvement as commonly seen with ADEM, cerebritis, or Bechet's. Vascular: Major flow voids are preserved. Skull and upper cervical spine: No evidence of marrow lesion. Sinuses/Orbits: Negative IMPRESSION: Multiple areas of juxtacortical  edema and enhancement, pattern strongly favoring tumefactive demyelination. Please see above. Electronically Signed   By: Marnee Spring M.D.   On: 07/06/2017 11:12   Dg Fluoro Guided Needle Plc Aspiration/injection Loc  Result Date: 07/07/2017 CLINICAL DATA:  Multiple subcortical frontal lesions. EXAM: DIAGNOSTIC LUMBAR PUNCTURE UNDER FLUOROSCOPIC GUIDANCE FLUOROSCOPY TIME:  Fluoroscopy Time:  12 sec Radiation Exposure Index (if provided by the fluoroscopic device): Number of Acquired Spot Images: 0 PROCEDURE: Informed consent was obtained from the patient prior to the procedure, including potential complications of headache, allergy, and pain. With the patient prone, the lower back was prepped with Betadine. 1% Lidocaine was used for local anesthesia. Lumbar puncture was performed at the L3-4 level using a 20 gauge needle with return of clear CSF with an opening pressure of 13 cm water. 8 ml of CSF were obtained for laboratory studies. The patient tolerated the procedure well and there were no apparent complications. IMPRESSION: Successful lumbar puncture under fluoroscopic guidance as above. Electronically Signed   By: Charlett Nose M.D.   On: 07/07/2017 09:56    Time Spent in minutes  25   Dilpreet Faires M.D on 07/07/2017 at 11:27 AM  Between 7am to 7pm - Pager - [phone]  After 7pm go to [email or web address] - password [facility]  [facility] -  Office  [phone]

## 2017-07-07 NOTE — Evaluation (Signed)
Physical Therapy Evaluation Patient Details Name: Lindsey Phelps MRN: [id number] DOB: Apr 05, 1981 Today's Date: 07/07/2017   History of Present Illness  Lindsey Phelps  is a 37 y.o. female, 37 year old female with history of hypertension, hyperlipidemia, CVA diagnosed in August 2018 with ongoing slurred speech and some residual right-sided weakness presented to the ED with 4-day history of worsened left-sided facial droop and slurred speech.  Patient reports that she has residual speech impairment with frequent word finding difficulty and some slurring since her stroke last year.  4 days back she noticed mild left-sided facial droop with worsened speech.  This was associated with sharp pain in her left side of the neck.  She also reports off and on difficulty swallowing.    Clinical Impression  Patient functioning at baseline for functional mobility and gait.  PLAN: discharge patient from physical therapy to care of nursing for ambulation ad lib for length of stay.    Follow Up Recommendations No PT follow up    Equipment Recommendations  None recommended by PT    Recommendations for Other Services       Precautions / Restrictions Precautions Precautions: None Restrictions Weight Bearing Restrictions: No      Mobility  Bed Mobility Overal bed mobility: Independent                Transfers Overall transfer level: Independent                  Ambulation/Gait Ambulation/Gait assistance: Independent Ambulation Distance (Feet): 100 Feet Assistive device: None Gait Pattern/deviations: WFL(Within Functional Limits)   Gait velocity interpretation: at or above normal speed for age/gender    Stairs            Wheelchair Mobility    Modified Rankin (Stroke Patients Only)       Balance Overall balance assessment: No apparent balance deficits (not formally assessed)                                           Pertinent Vitals/Pain Pain  Assessment: 0-10 Pain Score: 7  Pain Location: headache, low back discomfort at site of spinal tap Pain Descriptors / Indicators: Aching Pain Intervention(s): Limited activity within patient's tolerance;Monitored during session    Home Living Family/patient expects to be discharged to:: Private residence Living Arrangements: Spouse/significant other Available Help at Discharge: Family;Available PRN/intermittently Type of Home: House Home Access: Stairs to enter Entrance Stairs-Rails: Right;Left;Can reach both Entrance Stairs-Number of Steps: 2 Home Layout: One level Home Equipment: None      Prior Function Level of Independence: Independent               Hand Dominance   Dominant Hand: Right    Extremity/Trunk Assessment   Upper Extremity Assessment Upper Extremity Assessment: Defer to OT evaluation    Lower Extremity Assessment Lower Extremity Assessment: Overall WFL for tasks assessed    Cervical / Trunk Assessment Cervical / Trunk Assessment: Normal  Communication   Communication: No difficulties  Cognition Arousal/Alertness: Awake/alert Behavior During Therapy: WFL for tasks assessed/performed Overall Cognitive Status: Within Functional Limits for tasks assessed                                        General Comments      Exercises  Assessment/Plan    PT Assessment Patent does not need any further PT services  PT Problem List         PT Treatment Interventions      PT Goals (Current goals can be found in the Care Plan section)  Acute Rehab PT Goals Patient Stated Goal: return home PT Goal Formulation: With patient/family Time For Goal Achievement: 07-14-2017 Potential to Achieve Goals: Good    Frequency     Barriers to discharge        Co-evaluation               AM-PAC PT "6 Clicks" Daily Activity  Outcome Measure Difficulty turning over in bed (including adjusting bedclothes, sheets and blankets)?:  None Difficulty moving from lying on back to sitting on the side of the bed? : None Difficulty sitting down on and standing up from a chair with arms (e.g., wheelchair, bedside commode, etc,.)?: None Help needed moving to and from a bed to chair (including a wheelchair)?: None Help needed walking in hospital room?: None Help needed climbing 3-5 steps with a railing? : None 6 Click Score: 24    End of Session   Activity Tolerance: Patient tolerated treatment well Patient left: in bed;with call bell/phone within reach;with family/visitor present Nurse Communication: Mobility status PT Visit Diagnosis: Unsteadiness on feet (R26.81);Other abnormalities of gait and mobility (R26.89);Muscle weakness (generalized) (M62.81)    Time: 1610-9604 PT Time Calculation (min) (ACUTE ONLY): 17 min   Charges:     PT Treatments $Gait Training: 8-22 mins   PT G Codes:        3:55 PM, July 14, 2017 Ocie Bob, MPT Physical Therapist with [facility] [phone] office [phone] mobile phone

## 2017-07-07 NOTE — Telephone Encounter (Signed)
I spoke with Lindsey Phelps after I took a look at the MRI. It shows several new large enhancing lesions plus the old one in the left peri-insular region.      The appearance is really not typical for MS or stroke at this point as the original focus is still enhancing. Tumefactive MS is a possibility. I would also be concerned about lymphoma. She is HIV negative.  She notes that her speech worsened fairly suddenly on Friday 07/03/2017 with some difficulty with aphasia and with slurred speech and she had left facial drooping. She presented to the emergency room. For a while towards the end of last year and in January, she actually felt that her speech had improved some though not to baseline

## 2017-07-08 DIAGNOSIS — E785 Hyperlipidemia, unspecified: Secondary | ICD-10-CM

## 2017-07-08 DIAGNOSIS — F419 Anxiety disorder, unspecified: Secondary | ICD-10-CM

## 2017-07-08 DIAGNOSIS — I639 Cerebral infarction, unspecified: Secondary | ICD-10-CM

## 2017-07-08 DIAGNOSIS — I10 Essential (primary) hypertension: Secondary | ICD-10-CM

## 2017-07-08 DIAGNOSIS — K219 Gastro-esophageal reflux disease without esophagitis: Secondary | ICD-10-CM

## 2017-07-08 DIAGNOSIS — G379 Demyelinating disease of central nervous system, unspecified: Principal | ICD-10-CM

## 2017-07-08 LAB — ANTINUCLEAR ANTIBODIES, IFA: ANA Ab, IFA: NEGATIVE

## 2017-07-08 LAB — RPR: RPR Ser Ql: NONREACTIVE

## 2017-07-08 LAB — HERPES SIMPLEX VIRUS(HSV) DNA BY PCR
HSV 1 DNA: NEGATIVE
HSV 2 DNA: NEGATIVE

## 2017-07-08 LAB — VDRL, CSF: SYPHILIS VDRL QUANT CSF: NONREACTIVE

## 2017-07-08 MED ORDER — PANTOPRAZOLE SODIUM 40 MG PO TBEC: 40.0000 mg | DELAYED_RELEASE_TABLET | Freq: Every day | ORAL | 1 refills | Status: DC

## 2017-07-08 NOTE — Discharge Summary (Signed)
Physician Discharge Summary  Lindsey Phelps YQM:[id number] DOB: 05/25/1981 DOA: 07/06/2017  PCP: Lindsey Nevins, MD  Admit date: 07/06/2017 Discharge date: 07/08/2017  Time spent: 35 minutes  Recommendations for Outpatient Follow-up:  1. Repeat Sinemet to follow electrolytes, renal function and LFTs 2. Follow final results from lumbar puncture analysis 3. Outpatient follow up of her lipid panel.   Discharge Diagnoses:  Principal Problem:   Demyelinating changes in brain [facility]) Active Problems:   Slurred speech   Anxiety   Stroke [facility])   Discharge Condition: Stable and improved.  Patient has been discharged home with instructions to follow-up with PCP and primary neurologist as an outpatient.  Diet recommendation: Heart healthy diet  Filed Weights   07/06/17 0811  Weight: 89.4 kg (197 lb)    History of present illness:  37 year old female with hypertension, hyperlipidemia, CVA in August 2018 with ongoing slurred speech and some right-sided weakness presented to the ED with 4-day history of worsening left-sided facial droop and slurred speech.  Reports mother being diagnosed of some demyelinating disease when she was in her 48s. MRI brain showing areas of demyelination.  Hospital Course:  Demyelinating changes in brain: -Differential diagnosis including atypical multiple sclerosis versus vasculitis. -Patient received 2-day recommended 3 days of high-dose IV Solu-Medrol; case discussed with neurology who recommended no steroids at discharge and outpatient follow-up with her primary neurologist. -Patient symptoms improved at time of discharge, but per patient and family not quite back to baseline. -This is a good chance that the patient will require brain biopsy for definitive diagnosis -Final results from LP pending at discharge.  Prior hx of Stroke -Patient with some residual dysarthria and right-sided weakness from prior stroke. -Continue aspirin, statins and beta-blocker -No  acute strokes seen on MRI during this hospitalization.  Depression and anxiety -Continue bupropion  Hypertension -Stable and well-controlled. -Continue beta-blocker  Hyperlipidemia -Continue statins -Recommending repeat lipid panel and LFT's as an outpatient  GERD and GI prophylaxis -continue PPI  Procedures:  See below for x-ray reports  Consultations:  Neurology  Discharge Exam: Vitals:   07/08/17 1058 07/08/17 1456  BP: 118/69 117/63  Pulse:  70  Resp:  19  Temp:  98.1 F (36.7 C)  SpO2:  98%    General: Afebrile, in no acute distress. Cardiovascular: S1 and S2, no rubs, no gallops, no murmurs, no JVD. Respiratory: Good air movement bilaterally, no wheezing, no crackles Abdomen, soft, nontender, nondistended, positive bowel sounds. Extremities: No edema, no cyanosis or clubbing. Neurologic exam: Alert, awake and oriented x3, mild left facial droop appreciated, patient reporting mild right-sided weakness and there is no appreciated dysarthria but according to family members she is slow to respond in comparison to her baseline.  Discharge Instructions   Discharge Instructions    Diet - low sodium heart healthy   Complete by:  As directed    Discharge instructions   Complete by:  As directed    Take medication as prescribed Arrange follow-up with primary neurologist as an outpatient Keep yourself well-hydrated. Arrange follow-up with PCP in 10 days.     Allergies as of 07/08/2017      Reactions   Adhesive [tape]    Including bandaids   Darvocet [propoxyphene N-acetaminophen]    Demerol       Medication List    TAKE these medications   aspirin EC 81 MG tablet Take 81 mg daily by mouth.   busPIRone 15 MG tablet Commonly known as:  BUSPAR Take 1 tablet (15  mg total) by mouth 2 (two) times daily.   diazepam 10 MG tablet Commonly known as:  VALIUM Take 10 mg by mouth at bedtime as needed for anxiety.   loratadine 10 MG tablet Commonly known as:   CLARITIN Take 10 mg by mouth daily.   metoprolol tartrate 25 MG tablet Commonly known as:  LOPRESSOR TAKE ONE TABLET BY MOUTH TWICE DAILY   pantoprazole 40 MG tablet Commonly known as:  PROTONIX Take 1 tablet (40 mg total) by mouth daily. Start taking on:  07/09/2017   rosuvastatin 10 MG tablet Commonly known as:  CRESTOR Take 1 tablet (10 mg total) daily by mouth.      Allergies  Allergen Reactions  . Adhesive [Tape]     Including bandaids  . Darvocet [Propoxyphene N-Acetaminophen]   . Demerol    Follow-up Information    Lindsey Nevins, MD. Schedule an appointment as soon as possible for a visit in 10 day(s).   Specialty:  Internal Medicine Contact information: [address] [place] [address] [phone]           The results of significant diagnostics from this hospitalization (including imaging, microbiology, ancillary and laboratory) are listed below for reference.    Significant Diagnostic Studies: Ct Head Wo Contrast  Result Date: 07/06/2017 CLINICAL DATA:  Slurred speech EXAM: CT HEAD WITHOUT CONTRAST TECHNIQUE: Contiguous axial images were obtained from the base of the skull through the vertex without intravenous contrast. COMPARISON:  MRI 04/07/2017 FINDINGS: Brain: Area of low-density noted in the posterior right temporal lobe and both frontal lobes concerning for acute to subacute infarcts. No hemorrhage or hydrocephalus. No midline shift. Vascular: No hyperdense vessel or unexpected calcification. Skull: No acute calvarial abnormality. Sinuses/Orbits: Visualized paranasal sinuses and mastoids clear. Orbital soft tissues unremarkable. Other: None IMPRESSION: Areas of low-density noted in the posterior right temporal lobe in both frontal lobes, new since prior MRI concerning for acute to subacute infarcts. No hemorrhage. Electronically Signed   By: Charlett Nose M.D.   On: 07/06/2017 09:49   Mr Laqueta Jean BJ Contrast  Result Date: 07/06/2017 CLINICAL  DATA:  Slurred speech since Friday EXAM: MRI HEAD WITHOUT AND WITH CONTRAST TECHNIQUE: Multiplanar, multiecho pulse sequences of the brain and surrounding structures were obtained without and with intravenous contrast. CONTRAST:  18mL MULTIHANCE GADOBENATE DIMEGLUMINE 529 MG/ML IV SOLN COMPARISON:  Head CT from earlier today.  Brain MRI 04/07/2017 FINDINGS: Brain: Multiple foci of somewhat ring-like restricted diffusion with neighboring edematous appearance and local gyral expansion. These are seen in the right superficial temporal, bilateral superficial frontal, and parasagittal left frontal lobes-the latter reaching the corpus callosum body. There is associated enhancement that has a horseshoe type appearance. No central restricted diffusion for abscess. No typical nodular enhancement for metastasis. A small focus of nodular white matter enhancement lateral to the frontal horn of the left lateral ventricle has subsided since comparison brain MR. There is also a nonenhancing signal abnormality about the temporal horn of the left lateral ventricle. The cortex is spared, not expected for multifocal peripheral subacute infarct. The overall pattern is that of demyelination with tumefactive features, as with multiple sclerosis variant. There is no deep gray or brainstem involvement as commonly seen with ADEM, cerebritis, or Bechet's. Vascular: Major flow voids are preserved. Skull and upper cervical spine: No evidence of marrow lesion. Sinuses/Orbits: Negative IMPRESSION: Multiple areas of juxtacortical edema and enhancement, pattern strongly favoring tumefactive demyelination. Please see above. Electronically Signed   By: Kathrynn Ducking.D.  On: 07/06/2017 11:12   Dg Fluoro Guided Needle Plc Aspiration/injection Loc  Result Date: 07/07/2017 CLINICAL DATA:  Multiple subcortical frontal lesions. EXAM: DIAGNOSTIC LUMBAR PUNCTURE UNDER FLUOROSCOPIC GUIDANCE FLUOROSCOPY TIME:  Fluoroscopy Time:  12 sec Radiation  Exposure Index (if provided by the fluoroscopic device): Number of Acquired Spot Images: 0 PROCEDURE: Informed consent was obtained from the patient prior to the procedure, including potential complications of headache, allergy, and pain. With the patient prone, the lower back was prepped with Betadine. 1% Lidocaine was used for local anesthesia. Lumbar puncture was performed at the L3-4 level using a 20 gauge needle with return of clear CSF with an opening pressure of 13 cm water. 8 ml of CSF were obtained for laboratory studies. The patient tolerated the procedure well and there were no apparent complications. IMPRESSION: Successful lumbar puncture under fluoroscopic guidance as above. Electronically Signed   By: Charlett Nose M.D.   On: 07/07/2017 09:56    Microbiology: Recent Results (from the past 240 hour(s))  CSF culture     Status: None (Preliminary result)   Collection Time: 07/07/17  9:25 AM  Result Value Ref Range Status   Specimen Description   Final    CSF Performed at [facility], [address] [place], [place] [address]    Special Requests   Final    NONE Performed at [facility], [address] [place], [place] [address]    Gram Stain   Final    CYTOSPIN SMEAR WBC PRESENT, PREDOMINANTLY MONONUCLEAR NO ORGANISMS SEEN Performed at [facility] Performed at [facility], [address], [place], [place] [address]    Culture   Final    NO GROWTH 1 DAY Performed at [facility], [address] [place], [place] [address]    Report Status PENDING  Incomplete     Labs: Basic Metabolic Panel: Recent Labs  Lab 07/06/17 0853  NA 139  K 3.6  CL 107  CO2 22  GLUCOSE 95  BUN 13  CREATININE 0.76  CALCIUM 9.0   Liver Function Tests: Recent Labs  Lab 07/06/17 0853  AST 32  ALT 30  ALKPHOS 56  BILITOT 0.5  PROT 7.4  ALBUMIN 4.4   CBC: Recent Labs  Lab 07/06/17 0853  WBC 7.7  NEUTROABS 4.1  HGB 13.8  HCT 41.8  MCV 90.9  PLT 248     Signed:  Vassie Loll MD.  [facility] 07/08/2017, 3:04 PM

## 2017-07-08 NOTE — Telephone Encounter (Signed)
Patient is being discharged from the hospital today. She spoke to Dr. Epimenio Foot yesterday and said was told by him that she could be worked in to see him either tomorrow or Friday. Please call and advise

## 2017-07-08 NOTE — Progress Notes (Signed)
Discharge instructions read to patient and her family by Theora Gianotti RN.  Pt discharged to home with family

## 2017-07-08 NOTE — Telephone Encounter (Signed)
Spoke with Kendal Hymen.  She will come in at 8am tomorrow and we will work her in/fim

## 2017-07-09 ENCOUNTER — Encounter: Payer: Self-pay | Admitting: Neurology

## 2017-07-09 ENCOUNTER — Ambulatory Visit: Payer: BLUE CROSS/BLUE SHIELD | Admitting: Neurology

## 2017-07-09 ENCOUNTER — Other Ambulatory Visit: Payer: Self-pay

## 2017-07-09 VITALS — BP 121/78 | HR 61 | Resp 16 | Ht 66.0 in | Wt 202.5 lb

## 2017-07-09 DIAGNOSIS — R9089 Other abnormal findings on diagnostic imaging of central nervous system: Secondary | ICD-10-CM

## 2017-07-09 DIAGNOSIS — R29818 Other symptoms and signs involving the nervous system: Secondary | ICD-10-CM | POA: Diagnosis not present

## 2017-07-09 DIAGNOSIS — F419 Anxiety disorder, unspecified: Secondary | ICD-10-CM | POA: Diagnosis not present

## 2017-07-09 DIAGNOSIS — G3281 Cerebellar ataxia in diseases classified elsewhere: Secondary | ICD-10-CM

## 2017-07-09 DIAGNOSIS — R4782 Fluency disorder in conditions classified elsewhere: Secondary | ICD-10-CM

## 2017-07-09 LAB — IGG CSF INDEX
Albumin CSF-mCnc: 19 mg/dL (ref 11–48)
Albumin: 4.6 g/dL (ref 3.5–5.5)
CSF IgG Index: 0.8 — ABNORMAL HIGH (ref 0.0–0.7)
IGG (IMMUNOGLOBIN G), SERUM: 1049 mg/dL (ref 700–1600)
IGG/ALB RATIO, CSF: 0.17 (ref 0.00–0.25)
IgG, CSF: 3.3 mg/dL (ref 0.0–8.6)

## 2017-07-09 LAB — HOMOCYSTEINE: Homocysteine: 6.5 umol/L (ref 0.0–15.0)

## 2017-07-09 MED ORDER — INDOMETHACIN 25 MG PO CAPS: 25.0000 mg | ORAL_CAPSULE | Freq: Two times a day (BID) | ORAL | 0 refills | Status: DC

## 2017-07-09 NOTE — Progress Notes (Signed)
[facility]  PATIENT: Lindsey Phelps DOB: Jul 09, 1980  REFERRING DOCTOR OR PCP:  Elfredia Nevins SOURCE: Patient, notes from Dr. Sherwood Gambler, laboratory results.  _________________________________   HISTORICAL  CHIEF COMPLAINT:  Chief Complaint  Patient presents with  . Facial Drooping    Admitted to [facility] on 07/06/17 with slurred speech, left sided facial drooping. MRI brain was abnormal.  Here today to discuss results/fim  . Abnormal MRI Brain    HISTORY OF PRESENT ILLNESS:  Lindsey Phelps is a 37 year old woman with numbness, word finding difficulties, balance issues and other neurologic symptoms.  Update 07/09/2017: She woke up Friday morning with more aphasia and dysphagia.   She has had some difficulty with her gait but it was no worse over the last month.   She also has had more trouble with cognition and processing speed and executive functioning over the past month.    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    She has noted mild visual changes over time but notes her glasses are old and no asymmetry or color vision changes.   She denies any bladder issues.    She feels much more tired over the past 3 months.     She sleeps ok if she takes valium at night.     She was admitted to [facility] last week when she presented with worsening gait and worsening aphasia. An MRI was performed. Besides the left frontal lesion noted on the previous MRI, she had 4 additional large edema tenderness lesions that enhanced, one in the left frontal lobe, 2 in the right frontal lobe and another in the right temporal lobe. He also has a couple of punctate T2/FLAIR hyperintense foci that are less specific areas the small lesions did not enhance.   She received 2 days of IV Solu-Medrol and was discharged yesterday.  Update 06/11/2017: I first saw her 03/31/2017.   Was concerned about her word finding difficulties and an MRI of the brain was performed. It  showed a subacute left frontal stroke. There were a few other punctate T2/FLAIR hyperintense foci consistent with minimal chronic microvascular ischemic change.   I was most concerned about an embolic etiology. The carotid ultrasound was essentially normal. The cardiac event monitor was essentially normal.  A TEE showed a normal ejection fraction of 60-65 percent.   There was no evidence of a patent foramen ovale or other right-to-left shunt. The study was felt to be normal.  She is on aspirin, metoprolol and Crestor.  Here to the previous visit, she feels her speech is not much  Better abd she still has some aphasia and feels she needs to concentrate more with her speech.  She feels her right arm is still clumsy.   She has noted she spells words wrong a lot and her typing is slowed with more errors.   She still has some slurring. There are no new symptoms.l;  She also has some anxiety and BuSpar was started.   It helped some but may have contributed to dizziness and headaches so the dose was decreased to 5 mg twice a day.    Headaches are less frequent in general (she was having more at last visit).   She will get occasional sharp pains.    She used to smoke but none in 4 years (quit after dad had lung cancer). No OCP (had hysterectomy)  She has FH of strokes and MI's .    From 03/31/2017:  I had the pleasure seeing you patient, Lindsey Phelps, at [facility] for neurologic consultation regarding her numbness, word finding difficulties, balance issues and other neurologic symptoms.  She is a 37 yo woman who had an episode with chest pain and bilateral arm numbness on 01/27/2016 and was taken to the ED (workup was normal).  She who noted numbness in her jaw at night about 5 months ago when starting to fall asleep.  In August 2018, she had another episode of chest tightness and arm numbness and went to an ED.   Since then, she has noted more symptoms such as slurred speech, verbal fluency  issues, leg weakness and balance issues.    These symptoms mostly come and go for 10 - 30 minutes and now occur daily.   They occur randomly and in any position (sitting, standing, walking).    She also notes vision is blurry and she has episodes of worsening blurry vision, also lasting 10-30 minutes.   She has vertigo that is mild but constant.   It intensifies for a few minutes at a time sometimes while she is walking.      She has had panic attacks since 2016 after her brother was in a serious accident but she feels current symptoms are different.  She takes valium prn, just a few a month.  Many years ago, she was on Celexa.   She continues to note episodes of palpitations and tachycardia.   Metoprolol was started with benefit.     I reviewed the laboratory results from 02/21/2017. B12 is low-normal at 289. TSH, CBC and CMP are normal. LDL  and triglycerides were slightly elevated.   01/25/2017 CTA of the chest and CXR were normal.   She had a 30 day event monitor that showed intermittent tachycardia by her reports.  An Echo ws reportedly normal.    REVIEW OF SYSTEMS: Constitutional: No fevers, chills, sweats, or change in appetite.   She has mild sleep onset insomnia Eyes: No visual changes, double vision, eye pain Ear, nose and throat: No hearing loss, ear pain, nasal congestion, sore throat Cardiovascular: No chest pain.  Has intermittent tachycardia.  Respiratory: No shortness of breath at rest or with exertion.   No wheezes GastrointestinaI: No nausea, vomiting, diarrhea, abdominal pain, fecal incontinence Genitourinary: No dysuria, urinary retention or frequency.  No nocturia. Musculoskeletal: No neck pain, back pain Integumentary: No rash, pruritus, skin lesions Neurological: as above Psychiatric: No depression at this time.  H/o panic attacks. Endocrine: No palpitations, diaphoresis, change in appetite, change in weigh or increased thirst Hematologic/Lymphatic: No anemia, purpura,  petechiae. Allergic/Immunologic: has some seasonal allergies  ALLERGIES: Allergies  Allergen Reactions  . Adhesive [Tape]     Including bandaids  . Darvocet [Propoxyphene N-Acetaminophen]   . Demerol     HOME MEDICATIONS:  Current Outpatient Medications:  .  aspirin EC 81 MG tablet, Take 81 mg daily by mouth., Disp: , Rfl:  .  busPIRone (BUSPAR) 15 MG tablet, Take 1 tablet (15 mg total) by mouth 2 (two) times daily., Disp: 60 tablet, Rfl: 5 .  diazepam (VALIUM) 10 MG tablet, Take 10 mg by mouth at bedtime as needed for anxiety. , Disp: , Rfl:  .  loratadine (CLARITIN) 10 MG tablet, Take 10 mg by mouth daily., Disp: , Rfl:  .  metoprolol tartrate (LOPRESSOR) 25 MG tablet, TAKE ONE TABLET BY MOUTH TWICE DAILY, Disp: 60 tablet, Rfl: 6 .  pantoprazole (PROTONIX) 40 MG tablet, Take 1  tablet (40 mg total) by mouth daily., Disp: 30 tablet, Rfl: 1 .  rosuvastatin (CRESTOR) 10 MG tablet, Take 1 tablet (10 mg total) daily by mouth., Disp: 90 tablet, Rfl: 3 .  indomethacin (INDOCIN) 25 MG capsule, Take 1 capsule (25 mg total) by mouth 2 (two) times daily with a meal., Disp: 90 capsule, Rfl: 0 No current facility-administered medications for this visit.   Facility-Administered Medications Ordered in Other Visits:  .  gadopentetate dimeglumine (MAGNEVIST) injection 18 mL, 18 mL, Intravenous, Once PRN, Sater, Pearletha Furl, MD  PAST MEDICAL HISTORY: Past Medical History:  Diagnosis Date  . Anxiety   . Cold   . Heart palpitations   . High cholesterol   . History of IBS   . Hypertension   . Stroke [facility])     PAST SURGICAL HISTORY: Past Surgical History:  Procedure Laterality Date  . ABDOMINAL HYSTERECTOMY    . BLADDER NECK SUSPENSION    . CHOLECYSTECTOMY    . COLONOSCOPY  01/23/2011   Procedure: COLONOSCOPY;  Surgeon: Malissa Hippo, MD;  Location[facility];  Service: Endoscopy;  Laterality: N/A;  . LAPAROSCOPIC TUBAL LIGATION    . TEE WITHOUT CARDIOVERSION N/A 05/05/2017    Procedure: TRANSESOPHAGEAL ECHOCARDIOGRAM (TEE);  Surgeon: Laqueta Linden, MD;  Location: [facility];  Service: Cardiovascular;  Laterality: N/A;    FAMILY HISTORY: Family History  Problem Relation Age of Onset  . Diabetes Father   . Hypertension Father   . Colon polyps Father   . Lung cancer Father   . Hypertension Mother   . Coronary artery disease Mother   . COPD Mother     SOCIAL HISTORY:  Social History   Socioeconomic History  . Marital status: Married    Spouse name: Not on file  . Number of children: Not on file  . Years of education: Not on file  . Highest education level: Not on file  Social Needs  . Financial resource strain: Not on file  . Food insecurity - worry: Not on file  . Food insecurity - inability: Not on file  . Transportation needs - medical: Not on file  . Transportation needs - non-medical: Not on file  Occupational History  . Not on file  Tobacco Use  . Smoking status: Former Smoker    Packs/day: 0.50    Years: 15.00    Pack years: 7.50    Types: Cigarettes    Last attempt to quit: 09/16/2012    Years since quitting: 4.8  . Smokeless tobacco: Never Used  Substance and Sexual Activity  . Alcohol use: Yes    Alcohol/week: 0.6 oz    Types: 1 Shots of liquor per week    Comment: every few months  . Drug use: No  . Sexual activity: Yes    Birth control/protection: Surgical  Other Topics Concern  . Not on file  Social History Narrative  . Not on file     PHYSICAL EXAM  Vitals:   07/09/17 0944  BP: 121/78  Pulse: 61  Resp: 16  Weight: 202 lb 8 oz (91.9 kg)  Height: 5\' 6"  (1.676 m)    Body mass index is 32.68 kg/m.   General: The patient is well-developed and well-nourished and in no acute distress   Cardiovascular: The heart has a regular rate and rhythm with a normal S1 and S2.   Neurologic Exam  Mental status: The patient is alert and oriented x 3 at the time of the  examination. The patient has apparent normal  recent and remote memory, with an apparently normal attention span and concentration ability.  Speech is slowed and mildly slurred but she is able to name objects without difficulty couple appear phrasing errors during our conversation. There is a general slowness of her thought process.     Cranial nerves: Extraocular movements are full.  Facial strength and sensation is normal. Trapezius strength is strong. No dysarthria is noted.  The tongue is midline, and the patient has symmetric elevation of the soft palate. No obvious hearing deficits are noted.  Motor:  Muscle bulk is normal.   Tone is normal. Strength is  5 / 5 in all 4 extremities.   Sensory: He has intact sensation to touch and vibration  Coordination: Cerebellar testing reveals good finger-nose-finger and heel-to-shin bilaterally.  Gait and station: Station is normal.  The gait is wide and the tandem gait is moderately wide but she is able to do without support.. Romberg is negative.   Reflexes: Deep tendon reflexes are symmetric and 3 bilaterally in arms and knees and 2 at the ankles.  .   Plantar responses are flexor.    DIAGNOSTIC DATA (LABS, IMAGING, TESTING) - I reviewed patient records, labs, notes, testing and imaging myself where available.  Lab Results  Component Value Date   WBC 7.7 07/06/2017   HGB 13.8 07/06/2017   HCT 41.8 07/06/2017   MCV 90.9 07/06/2017   PLT 248 07/06/2017      Component Value Date/Time   NA 139 07/06/2017 0853   K 3.6 07/06/2017 0853   CL 107 07/06/2017 0853   CO2 22 07/06/2017 0853   GLUCOSE 95 07/06/2017 0853   BUN 13 07/06/2017 0853   CREATININE 0.76 07/06/2017 0853   CALCIUM 9.0 07/06/2017 0853   PROT 7.4 07/06/2017 0853   ALBUMIN 4.4 07/06/2017 0853   AST 32 07/06/2017 0853   ALT 30 07/06/2017 0853   ALKPHOS 56 07/06/2017 0853   BILITOT 0.5 07/06/2017 0853   GFRNONAA >60 07/06/2017 0853   GFRAA >60 07/06/2017 0853       ASSESSMENT AND PLAN  Abnormal brain MRI -  Plan: IgG, IgA, IgM, T-helper cells CD4/CD8 %, Angiotensin converting enzyme  Other symptoms and signs involving the nervous system - Plan: IgG, IgA, IgM, T-helper cells CD4/CD8 %, Angiotensin converting enzyme, CT ABDOMEN PELVIS W CONTRAST, CT CHEST W CONTRAST, MR CERVICAL SPINE W WO CONTRAST, MR THORACIC SPINE W WO CONTRAST  Cerebellar ataxia in diseases classified elsewhere (HCC) - Plan: IgG, IgA, IgM, T-helper cells CD4/CD8 %, Angiotensin converting enzyme, MR CERVICAL SPINE W WO CONTRAST, MR THORACIC SPINE W WO CONTRAST  Fluency disorder associated with underlying disease  Anxiety   1.     We had a long discussion about her unusual brain MRI and its significance. I showed her the actual MRI images.   The left frontal lesion is similar in appearance while for more large lesions have appeared since her MRI  04/2017..   The differential diagnosis would include atypical (tumefactive) MS, ADEM, metastatic lesions, sarcoid, primary CNS lymphoma.  Though in the differential diagnosis, the appearance is not typical for other primary CNS tumors or for vasculitis.  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 The oligoclonal bands and IgG index are still pending and cytology is still pending from the lumbar puncture. I would also check an MRI of the cervical and thoracic spine as she has difficulty with her gait. If she does have  one or more lesions consistent with MS, then this helps to diagnose. Because of the appearance on the MRI we need to also rule out metastatic lesions and I would order a chest, abdomen and pelvic CT scan. At her age, primary CNS B cell lymphomas are usually associated with immunosuppression. I will check a CD4 and IgG/IgM/IgA. We will check ACE to assess for the possibility of neurosarcoid.   When results return, if the diagnosis is uncertain, then I will recommend that we obtain a brain biopsy.  2.   I will have her get 2 more days of IV Solu-Medrol 3.   We will keep her informed as  the results come in and she is advised to call us if she has any new or worsening symptoms  45 minutes face-to-face evaluation with greater than one half the time counseling or coordinating care about the recent hospitalization and significance of the MRI and need for evaluation  Richard A. Epimenio Foot, MD, [facility] 07/09/2017, 1:18 PM Certified in Neurology, Clinical Neurophysiology, Sleep Medicine, Pain Medicine and Neuroimaging  [facility] [address], [address] [place], [place] [address] [phone]

## 2017-07-10 DIAGNOSIS — R9089 Other abnormal findings on diagnostic imaging of central nervous system: Secondary | ICD-10-CM | POA: Diagnosis not present

## 2017-07-10 LAB — T-HELPER CELLS CD4/CD8 %
% CD 4 Pos. Lymph.: 49.5 % (ref 30.8–58.5)
Absolute CD 4 Helper: 2426 /uL — ABNORMAL HIGH (ref 359–1519)
BASOS ABS: 0 10*3/uL (ref 0.0–0.2)
Basos: 0 %
CD3+CD4+ CELLS/CD3+CD8+ CELLS BLD: 2.78 (ref 0.92–3.72)
CD3+CD8+ Cells # Bld: 872 /uL (ref 109–897)
CD3+CD8+ Cells NFr Bld: 17.8 % (ref 12.0–35.5)
EOS (ABSOLUTE): 0.1 10*3/uL (ref 0.0–0.4)
Eos: 1 %
HEMATOCRIT: 36.4 % (ref 34.0–46.6)
HEMOGLOBIN: 12 g/dL (ref 11.1–15.9)
IMMATURE GRANS (ABS): 0 10*3/uL (ref 0.0–0.1)
IMMATURE GRANULOCYTES: 0 %
LYMPHS ABS: 4.9 10*3/uL — AB (ref 0.7–3.1)
LYMPHS: 53 %
MCH: 30.5 pg (ref 26.6–33.0)
MCHC: 33 g/dL (ref 31.5–35.7)
MCV: 92 fL (ref 79–97)
MONOS ABS: 0.5 10*3/uL (ref 0.1–0.9)
Monocytes: 6 %
NEUTROS PCT: 40 %
Neutrophils Absolute: 3.6 10*3/uL (ref 1.4–7.0)
PLATELETS: 216 10*3/uL (ref 150–379)
RBC: 3.94 x10E6/uL (ref 3.77–5.28)
RDW: 13.9 % (ref 12.3–15.4)
WBC: 9.1 10*3/uL (ref 3.4–10.8)

## 2017-07-10 LAB — CSF CULTURE W GRAM STAIN: Culture: NO GROWTH

## 2017-07-10 LAB — CSF CULTURE

## 2017-07-10 LAB — IGG, IGA, IGM
IGM (IMMUNOGLOBULIN M), SRM: 79 mg/dL (ref 26–217)
IgA/Immunoglobulin A, Serum: 140 mg/dL (ref 87–352)
IgG (Immunoglobin G), Serum: 845 mg/dL (ref 700–1600)

## 2017-07-10 LAB — ANGIOTENSIN CONVERTING ENZYME: Angio Convert Enzyme: 23 U/L (ref 14–82)

## 2017-07-13 ENCOUNTER — Telehealth: Payer: Self-pay | Admitting: *Deleted

## 2017-07-13 DIAGNOSIS — G44209 Tension-type headache, unspecified, not intractable: Secondary | ICD-10-CM | POA: Diagnosis not present

## 2017-07-13 DIAGNOSIS — I639 Cerebral infarction, unspecified: Secondary | ICD-10-CM | POA: Diagnosis not present

## 2017-07-13 DIAGNOSIS — Z6831 Body mass index (BMI) 31.0-31.9, adult: Secondary | ICD-10-CM | POA: Diagnosis not present

## 2017-07-13 DIAGNOSIS — Z1389 Encounter for screening for other disorder: Secondary | ICD-10-CM | POA: Diagnosis not present

## 2017-07-13 DIAGNOSIS — G379 Demyelinating disease of central nervous system, unspecified: Secondary | ICD-10-CM | POA: Diagnosis not present

## 2017-07-13 DIAGNOSIS — Z8673 Personal history of transient ischemic attack (TIA), and cerebral infarction without residual deficits: Secondary | ICD-10-CM | POA: Diagnosis not present

## 2017-07-13 DIAGNOSIS — R471 Dysarthria and anarthria: Secondary | ICD-10-CM | POA: Diagnosis not present

## 2017-07-13 DIAGNOSIS — E6609 Other obesity due to excess calories: Secondary | ICD-10-CM | POA: Diagnosis not present

## 2017-07-13 NOTE — Telephone Encounter (Signed)
LMOM for pt., letting her know that I have completed her FMLA forms and faxed them back to [facility] [employer], fax# [phone]. She does not need to return this call unless she has questions/fim

## 2017-07-14 ENCOUNTER — Ambulatory Visit: Payer: BLUE CROSS/BLUE SHIELD

## 2017-07-14 DIAGNOSIS — R29818 Other symptoms and signs involving the nervous system: Secondary | ICD-10-CM | POA: Diagnosis not present

## 2017-07-14 DIAGNOSIS — G3281 Cerebellar ataxia in diseases classified elsewhere: Secondary | ICD-10-CM

## 2017-07-15 LAB — OLIGOCLONAL BANDS, CSF + SERM

## 2017-07-15 MED ORDER — GADOPENTETATE DIMEGLUMINE 469.01 MG/ML IV SOLN: 18.0000 mL | Freq: Once | INTRAVENOUS | Status: AC | PRN

## 2017-07-16 ENCOUNTER — Telehealth: Payer: Self-pay | Admitting: *Deleted

## 2017-07-16 NOTE — Telephone Encounter (Signed)
Spoke with Lindsey Phelps and reviewed below lab and MRI results with her.  She verbalized understanding of same, confirmed that she is sched. for CT on 218./fim

## 2017-07-16 NOTE — Telephone Encounter (Signed)
-----   Message from Asa Lente, MD sent at 07/16/2017  9:52 AM EST ----- Please let her know that the lab work and the MRI of the spinal cord was normal. We will let her know what we get the results of the CT scans (I think her scheduled 2/18)

## 2017-07-20 ENCOUNTER — Ambulatory Visit (HOSPITAL_COMMUNITY)
Admission: RE | Admit: 2017-07-20 | Discharge: 2017-07-20 | Disposition: A | Discharge status: Home / Self Care | Payer: BLUE CROSS/BLUE SHIELD | Source: Ambulatory Visit | Attending: Neurology | Admitting: Neurology

## 2017-07-20 DIAGNOSIS — Z90710 Acquired absence of both cervix and uterus: Secondary | ICD-10-CM | POA: Diagnosis not present

## 2017-07-20 DIAGNOSIS — R29818 Other symptoms and signs involving the nervous system: Secondary | ICD-10-CM | POA: Diagnosis not present

## 2017-07-20 DIAGNOSIS — J9811 Atelectasis: Secondary | ICD-10-CM | POA: Diagnosis not present

## 2017-07-20 DIAGNOSIS — Z9049 Acquired absence of other specified parts of digestive tract: Secondary | ICD-10-CM | POA: Insufficient documentation

## 2017-07-20 DIAGNOSIS — R109 Unspecified abdominal pain: Secondary | ICD-10-CM | POA: Diagnosis not present

## 2017-07-20 MED ORDER — IOPAMIDOL (ISOVUE-300) INJECTION 61%
100.0000 mL | Freq: Once | INTRAVENOUS | Status: AC | PRN
  Administered 2017-07-20: 100 mL via INTRAVENOUS

## 2017-07-21 ENCOUNTER — Ambulatory Visit: Payer: BLUE CROSS/BLUE SHIELD | Admitting: Cardiovascular Disease

## 2017-07-22 ENCOUNTER — Telehealth: Payer: Self-pay | Admitting: *Deleted

## 2017-07-22 DIAGNOSIS — R29810 Facial weakness: Secondary | ICD-10-CM

## 2017-07-22 DIAGNOSIS — R9089 Other abnormal findings on diagnostic imaging of central nervous system: Secondary | ICD-10-CM

## 2017-07-22 NOTE — Telephone Encounter (Signed)
Spoke with Kendal Hymen and per RAS, advised CT abd./chest is ok/ no ca noted.  Still unclear if changes on MRI brain are due to tumefactive MS or lymphoma; will ned a brain bx. for definitive dx.  She verbalized understanding of same.  Sts. would like referral to Dr. Venetia Maxon.  Urgent referral placed in Epic.  I have offered for RAS to call her this afternoon to answer any questions that I am not able to answer for her, but she sts. she is ok for now, feels the questions she has at this point are related to brain bx. itself and will address those to Dr. Loreen Freud

## 2017-07-27 ENCOUNTER — Other Ambulatory Visit: Payer: Self-pay | Admitting: Neurosurgery

## 2017-07-27 DIAGNOSIS — G939 Disorder of brain, unspecified: Secondary | ICD-10-CM

## 2017-07-27 DIAGNOSIS — R4701 Aphasia: Secondary | ICD-10-CM | POA: Diagnosis not present

## 2017-07-28 ENCOUNTER — Ambulatory Visit (HOSPITAL_COMMUNITY)
Admission: RE | Admit: 2017-07-28 | Discharge: 2017-07-28 | Disposition: A | Discharge status: Home / Self Care | Payer: BLUE CROSS/BLUE SHIELD | Source: Ambulatory Visit | Attending: Neurosurgery | Admitting: Neurosurgery

## 2017-07-28 DIAGNOSIS — G939 Disorder of brain, unspecified: Secondary | ICD-10-CM | POA: Diagnosis not present

## 2017-07-28 DIAGNOSIS — G9389 Other specified disorders of brain: Secondary | ICD-10-CM | POA: Diagnosis not present

## 2017-07-28 MED ORDER — GADOBENATE DIMEGLUMINE 529 MG/ML IV SOLN
20.0000 mL | Freq: Once | INTRAVENOUS | Status: AC | PRN
  Administered 2017-07-28: 20 mL via INTRAVENOUS

## 2017-07-28 NOTE — H&P (Signed)
Patient ID:   [id number] Patient: Lindsey Phelps  Date of Birth: 1980-11-26 Visit Type: Office Visit   Date: 07/27/2017 10:00 AM Provider: Danae Orleans. Venetia Maxon MD   This 37 year old female presents for abnormal study.   History of Present Illness: 1.  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  Patient awoke with speech difficulties February 1st.  In symptoms did not improve, her neurologist recommended ER visit.  Workup for stroke vs MS ensued.   History:  Stroke August 2018 with slurred speech and right-sided weakness, anxiety, HTN, interstitial cystitis Surgical history:  Hysterectomy 2011, cholecystectomy 2006, bladder distension 2010  Cardiologist Dr. Darl Householder in [place] monitors tachycardia Neurologist Dr. Epimenio Foot   Imaging on [place]  The patient's MRIs show that there has been progressive lesions in the brain with persistent left frontal temporal lesion likely related to her previous a phase a and now with new her right frontal lesions which are ring-enhancing.  The concern is that these lesions may reflect tumefactive MS or possibly a B cell lymphoma   The patient initially awoke with speech difficulties in February 1st and has had problems with aphasic a which have improved following dosage of steroids.  She still complains of some difficulty with word finding and cannot remember names.  She has been having headaches but denies weakness or numbness although she did have some left facial droop.  She says that these symptoms of all started as of last year in August or September but of clearly worsened recently.   I was asked by her neurologist, Dr. Epimenio Foot, to perform a brain biopsy of the abnormal right frontal lesion to better guide further treatment          PAST MEDICAL HISTORY, SURGICAL HISTORY, FAMILY HISTORY, SOCIAL HISTORY AND REVIEW OF SYSTEMS I have reviewed the patient's past medical,  surgical, family and social history as well as the comprehensive review of systems as included on the [facility] history form dated 07/27/2017, which I have signed.   MEDICATIONS(added, continued or stopped this visit):   ALLERGIES:   Review of Systems System Neg/Pos Details  Constitutional Negative Chills, Fatigue, Fever, Malaise, Night sweats, Weight gain and Weight loss.  ENMT Negative Ear drainage, Hearing loss, Nasal drainage, Otalgia, Sinus pressure and Sore throat.  Eyes Negative Eye discharge, Eye pain and Vision changes.  Respiratory Negative Chronic cough, Cough, Dyspnea, Known TB exposure and Wheezing.  Cardio Negative Chest pain, Claudication, Edema and Irregular heartbeat/palpitations.  GI Negative Abdominal pain, Blood in stool, Change in stool pattern, Constipation, Decreased appetite, Diarrhea, Heartburn, Nausea and Vomiting.  GU Negative Dysuria, Hematuria, Polyuria (Genitourinary), Urinary frequency, Urinary incontinence and Urinary retention.  Endocrine Negative Cold intolerance, Heat intolerance, Polydipsia and Polyphagia.  Neuro Negative Dizziness, Extremity weakness, Gait disturbance, Headache, Memory impairment, Numbness in extremity, Seizures and Tremors.  Psych Negative Anxiety, Depression and Insomnia.  Integumentary Negative Brittle hair, Brittle nails, Change in shape/size of mole(s), Hair loss, Hirsutism, Hives, Pruritus, Rash and Skin lesion.  MS Negative Back pain, Joint pain, Joint swelling, Muscle weakness and Neck pain.  Hema/Lymph Negative Easy bleeding, Easy bruising and Lymphadenopathy.  Allergic/Immuno Negative Contact allergy, Environmental allergies, Food allergies and Seasonal allergies.  Reproductive Negative Breast discharge, Breast lumps, Dysmenorrhea, Dyspareunia, History of abnormal PAP smear, Hot flashes, Irregular menses and Vaginal discharge.   Vitals Date Temp F BP Pulse Ht In Wt Lb BMI BSA Pain Score  07/27/2017  138/88 65 66 202 32.6  0/10     PHYSICAL EXAM General Level of Distress: no acute distress Overall Appearance: normal  Head and Face  Right Left  Fundoscopic Exam:  normal normal    Cardiovascular Cardiac: regular rate and rhythm without murmur  Right Left  Carotid Pulses: normal normal  Respiratory Lungs: clear to auscultation  Neurological Orientation: normal Recent and Remote Memory: normal Attention Span and Concentration:   normal Language: normal Fund of Knowledge: normal  Right Left Sensation: normal normal Upper Extremity Coordination: normal normal  Lower Extremity Coordination: normal normal  Musculoskeletal Gait and Station: normal  Right Left Upper Extremity Muscle Strength: normal normal Lower Extremity Muscle Strength: normal normal Upper Extremity Muscle Tone:  normal normal Lower Extremity Muscle Tone: normal normal   Motor Strength Upper and lower extremity motor strength was tested in the clinically pertinent muscles.     Deep Tendon Reflexes  Right Left Biceps: normal normal Triceps: normal normal Brachioradialis: normal normal Patellar: normal normal Achilles: normal normal  Cranial Nerves II. Optic Nerve/Visual Fields: normal III. Oculomotor: normal IV. Trochlear: normal V. Trigeminal: normal VI. Abducens: normal VII. Facial: normal VIII. Acoustic/Vestibular: normal IX. Glossopharyngeal: normal X. Vagus: normal XI. Spinal Accessory: normal XII. 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 Assessment  Aphasia (R47.01).       2. Assessment Brain lesion (G93.9).           Pain Management Plan Pain Scale: 0/10. Method: Numeric Pain Intensity Scale. Location: head. Onset: 01/10/2018.  I was asked to perform right frontal brain biopsy and we will go ahead and do so.  She will have a BrainLAB MRI on an urgent basis and surgery will be scheduled for this Thursday.  Risks and benefits were discussed in detail with patient and she wished to proceed   Orders: Diagnostic Procedures: Assessment Procedure  G93.9 MRI Brain With & W/o Contrast BrainLab Protocol             Provider:  Venetia Maxon MD, Danae Orleans 07/27/2017 1:33 PM  Dictation edited by: Danae Orleans. Venetia Maxon    CC Providers: Despina Arias  [address] [place], [place] [address]-              Electronically signed by Danae Orleans Venetia Maxon MD on 07/27/2017 01:33 PM

## 2017-07-29 ENCOUNTER — Telehealth: Payer: Self-pay | Admitting: Neurology

## 2017-07-29 ENCOUNTER — Other Ambulatory Visit (INDEPENDENT_AMBULATORY_CARE_PROVIDER_SITE_OTHER): Payer: Self-pay

## 2017-07-29 ENCOUNTER — Other Ambulatory Visit: Payer: Self-pay | Admitting: *Deleted

## 2017-07-29 ENCOUNTER — Other Ambulatory Visit: Payer: Self-pay | Admitting: Neurology

## 2017-07-29 ENCOUNTER — Encounter: Payer: Self-pay | Admitting: *Deleted

## 2017-07-29 DIAGNOSIS — G35 Multiple sclerosis: Secondary | ICD-10-CM | POA: Diagnosis not present

## 2017-07-29 DIAGNOSIS — Z0289 Encounter for other administrative examinations: Secondary | ICD-10-CM

## 2017-07-29 NOTE — Telephone Encounter (Signed)
Pt calling wanting to know if she can start taking   aspirin EC 81 MG tablet  Again

## 2017-07-30 ENCOUNTER — Inpatient Hospital Stay (HOSPITAL_COMMUNITY): Admission: RE | Admit: 2017-07-30 | Payer: BLUE CROSS/BLUE SHIELD | Source: Ambulatory Visit | Admitting: Neurosurgery

## 2017-07-30 ENCOUNTER — Encounter (HOSPITAL_COMMUNITY): Admission: RE | Payer: Self-pay | Source: Ambulatory Visit

## 2017-07-30 LAB — CBC WITH DIFFERENTIAL/PLATELET
Basophils Absolute: 0 10*3/uL (ref 0.0–0.2)
Basos: 1 %
EOS (ABSOLUTE): 0.3 10*3/uL (ref 0.0–0.4)
EOS: 5 %
HEMATOCRIT: 40.3 % (ref 34.0–46.6)
Hemoglobin: 13.4 g/dL (ref 11.1–15.9)
Immature Grans (Abs): 0 10*3/uL (ref 0.0–0.1)
Immature Granulocytes: 0 %
LYMPHS ABS: 2.7 10*3/uL (ref 0.7–3.1)
Lymphs: 45 %
MCH: 30.7 pg (ref 26.6–33.0)
MCHC: 33.3 g/dL (ref 31.5–35.7)
MCV: 92 fL (ref 79–97)
MONOS ABS: 0.5 10*3/uL (ref 0.1–0.9)
Monocytes: 8 %
NEUTROS ABS: 2.5 10*3/uL (ref 1.4–7.0)
Neutrophils: 41 %
Platelets: 253 10*3/uL (ref 150–379)
RBC: 4.37 x10E6/uL (ref 3.77–5.28)
RDW: 13.7 % (ref 12.3–15.4)
WBC: 6.1 10*3/uL (ref 3.4–10.8)

## 2017-07-30 SURGERY — FRAMELESS STEREOTACTIC BIOPSY
Anesthesia: General | Laterality: Right

## 2017-07-30 NOTE — Telephone Encounter (Signed)
Spoke with Kendal Hymen and per RAS, advised she can stay off of ASA.  Does not need to restart it/fim

## 2017-07-30 NOTE — Telephone Encounter (Signed)
Spoke with Lindsey Phelps.  Dr. Venetia Maxon stopped her ASA 81mg  when brain bx. was scheduled, but now that she is no longer having the bx., she wants to know if she should restart it.  Will check with RAS and call her back/fim

## 2017-07-31 ENCOUNTER — Telehealth: Payer: Self-pay | Admitting: *Deleted

## 2017-07-31 NOTE — Telephone Encounter (Signed)
FMLA forms completed and faxed to [facility] [employer], fax# [phone]/fim

## 2017-08-05 ENCOUNTER — Telehealth: Payer: Self-pay | Admitting: Neurology

## 2017-08-05 NOTE — Telephone Encounter (Signed)
Spoke with Lindsey Phelps.  She sts. she has "a ton of questions".  I have explained that  a lengthy conversation is best held w/i the confines of an ov.  She is agreeable.  Appt. given 9am tomorrowfim

## 2017-08-05 NOTE — Telephone Encounter (Signed)
Pt called she has a lot of questions for Dr Epimenio Foot reg dx of stroke, then MS and is wanting to know why he chose tysabri over other drugs that do not have as many side effects. Pt was offered the 1st available appt on 4/11 but she said she was due to start the infusion before then. Please call to advise.

## 2017-08-06 ENCOUNTER — Encounter: Payer: Self-pay | Admitting: Neurology

## 2017-08-06 ENCOUNTER — Ambulatory Visit: Payer: BLUE CROSS/BLUE SHIELD | Admitting: Neurology

## 2017-08-06 ENCOUNTER — Other Ambulatory Visit: Payer: Self-pay

## 2017-08-06 VITALS — BP 108/75 | HR 76 | Resp 18 | Ht 66.0 in | Wt 202.0 lb

## 2017-08-06 DIAGNOSIS — R200 Anesthesia of skin: Secondary | ICD-10-CM

## 2017-08-06 DIAGNOSIS — F419 Anxiety disorder, unspecified: Secondary | ICD-10-CM | POA: Diagnosis not present

## 2017-08-06 DIAGNOSIS — R4781 Slurred speech: Secondary | ICD-10-CM

## 2017-08-06 DIAGNOSIS — G35 Multiple sclerosis: Secondary | ICD-10-CM | POA: Diagnosis not present

## 2017-08-06 DIAGNOSIS — R4782 Fluency disorder in conditions classified elsewhere: Secondary | ICD-10-CM

## 2017-08-06 MED ORDER — SERTRALINE HCL 50 MG PO TABS: 50.0000 mg | ORAL_TABLET | Freq: Every day | ORAL | 11 refills | Status: DC

## 2017-08-06 NOTE — Progress Notes (Signed)
[facility]  PATIENT: Lindsey Phelps DOB: 1980/09/14  REFERRING DOCTOR OR PCP:  Elfredia Nevins SOURCE: Patient, notes from Dr. Sherwood Gambler, laboratory results.  _________________________________   HISTORICAL  CHIEF COMPLAINT:  Chief Complaint  Patient presents with  . Multiple Sclerosis    Has questions regarding MS dx, previous CVA dx, tx. options/fim    HISTORY OF PRESENT ILLNESS:  Lindsey Phelps is a 37 year old woman with numbness, word finding difficulties, balance issues and other neurologic symptoms.  Update 08/06/2017: Since the last visit, her cerebrospinal fluid was borderline with 3 oligoclonal bands. MRI of the spinal cord did not show any additional lesions. There was no evidence of tumor on chest/abdomen/pelvic CT scan.     Additionally  she saw Dr. Venetia Maxon in neurosurgery. He also ran her case by neuroradiology and neuro-oncology. After the steroids, there was an improved appearance on the MRI. There were no new lesions.   I discussed with body that the likelihood that she has tumefactive MS is probably higher than the likelihood of a CNS lymphoma or other disorder.   Therefore an option would be to hold off on a brain biopsy and do several months treatment and then reimage. On re-imaging, if there is worsening, we would need to reconsider a brain biopsy.  Clinically, her speech is better.  Balance is better.    She notes positional numbness in her hands, worse with tasks like holding a phone.  Sometimes she wakes up with the numbness.    It then improves.      On Buspar, anxiety improved but she got headaches.    On 5 mg po bid, she is still getting headache but they are tolerable and her anxiety is still better.    Update 07/09/2017: She woke up Friday morning with more aphasia and dysphagia.   She has had some difficulty with her gait but it was no worse over the last month.   She also has had more trouble with cognition and processing speed and  executive functioning over the past month.    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    She has noted mild visual changes over time but notes her glasses are old and no asymmetry or color vision changes.   She denies any bladder issues.    She feels much more tired over the past 3 months.     She sleeps ok if she takes valium at night.     She was admitted to [facility] last week when she presented with worsening gait and worsening aphasia. An MRI was performed. Besides the left frontal lesion noted on the previous MRI, she had 4 additional large edema tenderness lesions that enhanced, one in the left frontal lobe, 2 in the right frontal lobe and another in the right temporal lobe. He also has a couple of punctate T2/FLAIR hyperintense foci that are less specific areas the small lesions did not enhance.   She received 2 days of IV Solu-Medrol and was discharged yesterday.  Update 06/11/2017: I first saw her 03/31/2017.   Was concerned about her word finding difficulties and an MRI of the brain was performed. It showed a subacute left frontal stroke. There were a few other punctate T2/FLAIR hyperintense foci consistent with minimal chronic microvascular ischemic change.   I was most concerned about an embolic etiology. The carotid ultrasound was essentially normal. The cardiac event monitor was essentially normal.  A TEE showed a normal ejection fraction of  60-65 percent.   There was no evidence of a patent foramen ovale or other right-to-left shunt. The study was felt to be normal.  She is on aspirin, metoprolol and Crestor.  Here to the previous visit, she feels her speech is not much  Better abd she still has some aphasia and feels she needs to concentrate more with her speech.  She feels her right arm is still clumsy.   She has noted she spells words wrong a lot and her typing is slowed with more errors.   She still has some slurring. There are no new symptoms.l;  She also has some  anxiety and BuSpar was started.   It helped some but may have contributed to dizziness and headaches so the dose was decreased to 5 mg twice a day.    Headaches are less frequent in general (she was having more at last visit).   She will get occasional sharp pains.    She used to smoke but none in 4 years (quit after dad had lung cancer). No OCP (had hysterectomy)  She has FH of strokes and MI's .    From 03/31/2017: I had the pleasure seeing you patient, Lindsey Phelps, at [facility] for neurologic consultation regarding her numbness, word finding difficulties, balance issues and other neurologic symptoms.  She is a 37 yo woman who had an episode with chest pain and bilateral arm numbness on 01/27/2016 and was taken to the ED (workup was normal).  She who noted numbness in her jaw at night about 5 months ago when starting to fall asleep.  In August 2018, she had another episode of chest tightness and arm numbness and went to an ED.   Since then, she has noted more symptoms such as slurred speech, verbal fluency issues, leg weakness and balance issues.    These symptoms mostly come and go for 10 - 30 minutes and now occur daily.   They occur randomly and in any position (sitting, standing, walking).    She also notes vision is blurry and she has episodes of worsening blurry vision, also lasting 10-30 minutes.   She has vertigo that is mild but constant.   It intensifies for a few minutes at a time sometimes while she is walking.      She has had panic attacks since 2016 after her brother was in a serious accident but she feels current symptoms are different.  She takes valium prn, just a few a month.  Many years ago, she was on Celexa.   She continues to note episodes of palpitations and tachycardia.   Metoprolol was started with benefit.     I reviewed the laboratory results from 02/21/2017. B12 is low-normal at 289. TSH, CBC and CMP are normal. LDL  and triglycerides were slightly  elevated.   01/25/2017 CTA of the chest and CXR were normal.   She had a 30 day event monitor that showed intermittent tachycardia by her reports.  An Echo ws reportedly normal.    REVIEW OF SYSTEMS: Constitutional: No fevers, chills, sweats, or change in appetite.   She has mild sleep onset insomnia Eyes: No visual changes, double vision, eye pain Ear, nose and throat: No hearing loss, ear pain, nasal congestion, sore throat Cardiovascular: No chest pain.  Has intermittent tachycardia.  Respiratory: No shortness of breath at rest or with exertion.   No wheezes GastrointestinaI: No nausea, vomiting, diarrhea, abdominal pain, fecal incontinence Genitourinary: No dysuria, urinary retention or frequency.  No nocturia. Musculoskeletal: No neck pain, back pain Integumentary: No rash, pruritus, skin lesions Neurological: as above Psychiatric: No depression at this time.  H/o panic attacks. Endocrine: No palpitations, diaphoresis, change in appetite, change in weigh or increased thirst Hematologic/Lymphatic: No anemia, purpura, petechiae. Allergic/Immunologic: has some seasonal allergies  ALLERGIES: Allergies  Allergen Reactions  . Darvocet [Propoxyphene N-Acetaminophen] Hives  . Demerol Hives  . Adhesive [Tape] Rash and Other (See Comments)    Including bandaids - takes skin off, peels    HOME MEDICATIONS:  Current Outpatient Medications:  .  busPIRone (BUSPAR) 15 MG tablet, Take 1 tablet (15 mg total) by mouth 2 (two) times daily. (Patient taking differently: Take 5 mg by mouth 2 (two) times daily. ), Disp: 60 tablet, Rfl: 5 .  diazepam (VALIUM) 10 MG tablet, Take 10 mg by mouth at bedtime as needed for anxiety. , Disp: , Rfl:  .  ibuprofen (ADVIL,MOTRIN) 200 MG tablet, Take 400 mg by mouth every 6 (six) hours as needed for headache or moderate pain., Disp: , Rfl:  .  loratadine (CLARITIN) 10 MG tablet, Take 10 mg by mouth daily., Disp: , Rfl:  .  metoprolol tartrate (LOPRESSOR) 25 MG  tablet, TAKE ONE TABLET BY MOUTH TWICE DAILY (Patient taking differently: TAKE 25 MG BY MOUTH TWICE DAILY), Disp: 60 tablet, Rfl: 6 .  aspirin EC 81 MG tablet, Take 81 mg daily by mouth., Disp: , Rfl:  .  indomethacin (INDOCIN) 25 MG capsule, Take 1 capsule (25 mg total) by mouth 2 (two) times daily with a meal., Disp: 90 capsule, Rfl: 0 .  natalizumab (TYSABRI) 300 MG/15ML injection, Inject 300 mg into the vein every 28 (twenty-eight) days., Disp: , Rfl:  .  pantoprazole (PROTONIX) 40 MG tablet, Take 1 tablet (40 mg total) by mouth daily. (Patient not taking: Reported on 08/06/2017), Disp: 30 tablet, Rfl: 1 .  rosuvastatin (CRESTOR) 10 MG tablet, Take 1 tablet (10 mg total) daily by mouth., Disp: 90 tablet, Rfl: 3 .  sertraline (ZOLOFT) 50 MG tablet, Take 1 tablet (50 mg total) by mouth daily., Disp: 30 tablet, Rfl: 11 No current facility-administered medications for this visit.   Facility-Administered Medications Ordered in Other Visits:  .  gadopentetate dimeglumine (MAGNEVIST) injection 18 mL, 18 mL, Intravenous, Once PRN, Teal Bontrager A, MD .  gadopentetate dimeglumine (MAGNEVIST) injection 18 mL, 18 mL, Intravenous, Once PRN, Omar Gayden, Pearletha Furl, MD  PAST MEDICAL HISTORY: Past Medical History:  Diagnosis Date  . Anxiety   . Cold   . Heart palpitations   . High cholesterol   . History of IBS   . Hypertension   . Stroke [facility])     PAST SURGICAL HISTORY: Past Surgical History:  Procedure Laterality Date  . ABDOMINAL HYSTERECTOMY    . BLADDER NECK SUSPENSION    . CHOLECYSTECTOMY    . COLONOSCOPY  01/23/2011   Procedure: COLONOSCOPY;  Surgeon: Malissa Hippo, MD;  Location: [facility];  Service: Endoscopy;  Laterality: N/A;  . LAPAROSCOPIC TUBAL LIGATION    . TEE WITHOUT CARDIOVERSION N/A 05/05/2017   Procedure: TRANSESOPHAGEAL ECHOCARDIOGRAM (TEE);  Surgeon: Laqueta Linden, MD;  Location: [facility];  Service: Cardiovascular;  Laterality: N/A;    FAMILY HISTORY: Family  History  Problem Relation Age of Onset  . Diabetes Father   . Hypertension Father   . Colon polyps Father   . Lung cancer Father   . Hypertension Mother   . Coronary artery disease Mother   .  COPD Mother     SOCIAL HISTORY:  Social History   Socioeconomic History  . Marital status: Married    Spouse name: Not on file  . Number of children: Not on file  . Years of education: Not on file  . Highest education level: Not on file  Social Needs  . Financial resource strain: Not on file  . Food insecurity - worry: Not on file  . Food insecurity - inability: Not on file  . Transportation needs - medical: Not on file  . Transportation needs - non-medical: Not on file  Occupational History  . Not on file  Tobacco Use  . Smoking status: Former Smoker    Packs/day: 0.50    Years: 15.00    Pack years: 7.50    Types: Cigarettes    Last attempt to quit: 09/16/2012    Years since quitting: 4.8  . Smokeless tobacco: Never Used  Substance and Sexual Activity  . Alcohol use: Yes    Alcohol/week: 0.6 oz    Types: 1 Shots of liquor per week    Comment: every few months  . Drug use: No  . Sexual activity: Yes    Birth control/protection: Surgical  Other Topics Concern  . Not on file  Social History Narrative  . Not on file     PHYSICAL EXAM  Vitals:   08/06/17 0856  BP: 108/75  Pulse: 76  Resp: 18  Weight: 202 lb (91.6 kg)  Height: 5\' 6"  (1.676 m)    Body mass index is 32.6 kg/m.   General: The patient is well-developed and well-nourished and in no acute distress    Neurologic Exam  Mental status: The patient is alert and oriented x 3 at the time of the examination. The patient has apparent normal recent and remote memory, with an apparently normal attention span and concentration ability.  Speech is slowed and mildly slurred but she is able to name objects without difficulty couple appear phrasing errors during our conversation. There is a general slowness of her  thought process.     Cranial nerves: Extraocular movements are full.  Facial strength and sensation is normal. Trapezius strength is strong. No dysarthria is noted.  The tongue is midline, and the patient has symmetric elevation of the soft palate. No obvious hearing deficits are noted.  Motor:  Muscle bulk is normal.   Tone is normal. Strength is  5 / 5 in all 4 extremities.   Sensory: Intact sensation to touch and vibration in the arms and legs.   Coordination: Cerebellar testing reveals good finger-nose-finger and heel-to-shin bilaterally.  Gait and station: Station is normal.  The gait is minimally wide and the tandem gait is mildly wide. Romberg is negative.   Reflexes: Deep tendon reflexes are symmetric and 3 bilaterally in arms and knees and 2 at the ankles.  .   Plantar responses are flexor.    DIAGNOSTIC DATA (LABS, IMAGING, TESTING) - I reviewed patient records, labs, notes, testing and imaging myself where available.  Lab Results  Component Value Date   WBC 6.1 07/29/2017   HGB 13.4 07/29/2017   HCT 40.3 07/29/2017   MCV 92 07/29/2017   PLT 253 07/29/2017      Component Value Date/Time   NA 139 07/06/2017 0853   K 3.6 07/06/2017 0853   CL 107 07/06/2017 0853   CO2 22 07/06/2017 0853   GLUCOSE 95 07/06/2017 0853   BUN 13 07/06/2017 0853   CREATININE 0.76 07/06/2017  0853   CALCIUM 9.0 07/06/2017 0853   PROT 7.4 07/06/2017 0853   ALBUMIN 4.6 07/07/2017 0925   AST 32 07/06/2017 0853   ALT 30 07/06/2017 0853   ALKPHOS 56 07/06/2017 0853   BILITOT 0.5 07/06/2017 0853   GFRNONAA >60 07/06/2017 0853   GFRAA >60 07/06/2017 0853       ASSESSMENT AND PLAN  Multiple sclerosis (HCC)  Numbness  Slurred speech  Anxiety  Fluency disorder associated with underlying disease   1.      We discussed Tysabri in detail. I discussed how the JCV antibody can be used to guide [place] to the duration of her therapy. If she uses middle or high positive, I would want her on a  different medication year if she continues on a disease modifying therapy. If she is JCV antibody negative, she can continue on Tysabri long-term.   We should be able to get her Tysabri started within by the end of next week 2.   Because of the headaches on BuSpar, she will change to Zoloft. 3.   Will return to see me in 3 months or sooner if there are new or worsening neurologic symptoms.  Caiya Bettes A. Epimenio Foot, MD, [facility] 08/06/2017, 5:43 PM Certified in Neurology, Clinical Neurophysiology, Sleep Medicine, Pain Medicine and Neuroimaging  [facility] [address] [place], [place] [address] [phone]

## 2017-08-07 ENCOUNTER — Encounter: Payer: Self-pay | Admitting: *Deleted

## 2017-08-12 ENCOUNTER — Encounter: Payer: Self-pay | Admitting: *Deleted

## 2017-08-12 DIAGNOSIS — G35 Multiple sclerosis: Secondary | ICD-10-CM | POA: Diagnosis not present

## 2017-08-31 ENCOUNTER — Telehealth: Payer: Self-pay | Admitting: Neurology

## 2017-08-31 NOTE — Telephone Encounter (Signed)
Spoke with pt.  She c/o intermittent, brief sharp shooting pain back of head, onset last night. No neuro deficits, not assoc. with other sx. Sts. she only wanted RAS to be aware of the pain, not calling for appt. at this time.  If pain persists or worsens, she will call back for appt./fim

## 2017-08-31 NOTE — Telephone Encounter (Signed)
Patient is calling. Since yesterday she has been experiencing intermittent shooting pain on the back right side of her head. Please call and discuss. She has an infusion on 09-20-27.

## 2017-09-09 DIAGNOSIS — G35 Multiple sclerosis: Secondary | ICD-10-CM | POA: Diagnosis not present

## 2017-09-09 DIAGNOSIS — R9089 Other abnormal findings on diagnostic imaging of central nervous system: Secondary | ICD-10-CM | POA: Diagnosis not present

## 2017-09-10 ENCOUNTER — Telehealth: Payer: Self-pay | Admitting: *Deleted

## 2017-09-10 DIAGNOSIS — Z0289 Encounter for other administrative examinations: Secondary | ICD-10-CM

## 2017-09-10 NOTE — Telephone Encounter (Signed)
Pt attending physicians statement on Faith desk.

## 2017-09-11 NOTE — Telephone Encounter (Signed)
Form completed and returned to med. records/fim

## 2017-10-06 DIAGNOSIS — J069 Acute upper respiratory infection, unspecified: Secondary | ICD-10-CM | POA: Diagnosis not present

## 2017-10-06 DIAGNOSIS — Z6832 Body mass index (BMI) 32.0-32.9, adult: Secondary | ICD-10-CM | POA: Diagnosis not present

## 2017-10-06 DIAGNOSIS — J302 Other seasonal allergic rhinitis: Secondary | ICD-10-CM | POA: Diagnosis not present

## 2017-10-06 DIAGNOSIS — Z1389 Encounter for screening for other disorder: Secondary | ICD-10-CM | POA: Diagnosis not present

## 2017-10-06 DIAGNOSIS — E6609 Other obesity due to excess calories: Secondary | ICD-10-CM | POA: Diagnosis not present

## 2017-10-07 DIAGNOSIS — G35 Multiple sclerosis: Secondary | ICD-10-CM | POA: Diagnosis not present

## 2017-10-07 DIAGNOSIS — R9089 Other abnormal findings on diagnostic imaging of central nervous system: Secondary | ICD-10-CM | POA: Diagnosis not present

## 2017-10-15 DIAGNOSIS — J302 Other seasonal allergic rhinitis: Secondary | ICD-10-CM | POA: Diagnosis not present

## 2017-10-29 ENCOUNTER — Telehealth: Payer: Self-pay | Admitting: Neurology

## 2017-10-29 ENCOUNTER — Other Ambulatory Visit: Payer: Self-pay | Admitting: Cardiovascular Disease

## 2017-10-29 DIAGNOSIS — G35 Multiple sclerosis: Secondary | ICD-10-CM

## 2017-10-29 NOTE — Telephone Encounter (Signed)
Spoke with Kendal Hymen.  She has 4th Ty infusion scheduled next week--per RAS last ov, he wanted to check and MRI after several mos. of medication.  MRI brain with and without contrast ordered/fim

## 2017-10-29 NOTE — Telephone Encounter (Addendum)
Pt said she was advised of needing a MRI of brain after 3rd infusion (it was on 10/07/17). She is wanting to get that scheduled but there is not a referral.  Her next infusion is 11/04/17 but she will need to get that changed due to work conflict. Please call to advise on the MRI.

## 2017-10-29 NOTE — Telephone Encounter (Signed)
MR Brain w/wo contrast Dr. Gershon Mussel Auth: [id number] (exp. 10/29/17 to 12/27/17). Patient is scheduled for 11/10/17 on the [facility] mobile unit.

## 2017-11-03 DIAGNOSIS — G35 Multiple sclerosis: Secondary | ICD-10-CM | POA: Diagnosis not present

## 2017-11-03 DIAGNOSIS — R9089 Other abnormal findings on diagnostic imaging of central nervous system: Secondary | ICD-10-CM | POA: Diagnosis not present

## 2017-11-06 DIAGNOSIS — Z6832 Body mass index (BMI) 32.0-32.9, adult: Secondary | ICD-10-CM | POA: Diagnosis not present

## 2017-11-06 DIAGNOSIS — E6609 Other obesity due to excess calories: Secondary | ICD-10-CM | POA: Diagnosis not present

## 2017-11-06 DIAGNOSIS — G35 Multiple sclerosis: Secondary | ICD-10-CM | POA: Diagnosis not present

## 2017-11-06 DIAGNOSIS — Z1389 Encounter for screening for other disorder: Secondary | ICD-10-CM | POA: Diagnosis not present

## 2017-11-10 ENCOUNTER — Ambulatory Visit (INDEPENDENT_AMBULATORY_CARE_PROVIDER_SITE_OTHER): Payer: BLUE CROSS/BLUE SHIELD

## 2017-11-10 ENCOUNTER — Encounter: Payer: Self-pay | Admitting: Neurology

## 2017-11-10 DIAGNOSIS — G35 Multiple sclerosis: Secondary | ICD-10-CM

## 2017-11-10 MED ORDER — GADOPENTETATE DIMEGLUMINE 469.01 MG/ML IV SOLN
19.0000 mL | Freq: Once | INTRAVENOUS | Status: AC | PRN
  Administered 2017-11-10: 19 mL via INTRAVENOUS

## 2017-11-11 ENCOUNTER — Telehealth: Payer: Self-pay | Admitting: *Deleted

## 2017-11-11 NOTE — Telephone Encounter (Signed)
-----   Message from Asa Lente, MD sent at 11/10/2017  6:36 PM EDT ----- Please let her know that the MRI of the brain looks much better.  There is much less swelling of the plaques in the brain and none of them enhance.

## 2017-11-11 NOTE — Telephone Encounter (Signed)
Spoke with Kendal Hymen and reviewed below MRI results.  She verbalized understanding of same/fim

## 2017-11-20 ENCOUNTER — Ambulatory Visit: Payer: BLUE CROSS/BLUE SHIELD | Admitting: Neurology

## 2017-11-20 ENCOUNTER — Ambulatory Visit
Admission: RE | Admit: 2017-11-20 | Discharge: 2017-11-20 | Disposition: A | Discharge status: Home / Self Care | Payer: BLUE CROSS/BLUE SHIELD | Source: Ambulatory Visit | Attending: Neurology | Admitting: Neurology

## 2017-11-20 ENCOUNTER — Encounter: Payer: Self-pay | Admitting: Neurology

## 2017-11-20 VITALS — BP 112/80 | HR 78 | Ht 66.0 in | Wt 199.0 lb

## 2017-11-20 DIAGNOSIS — R079 Chest pain, unspecified: Secondary | ICD-10-CM

## 2017-11-20 DIAGNOSIS — R39198 Other difficulties with micturition: Secondary | ICD-10-CM

## 2017-11-20 DIAGNOSIS — R200 Anesthesia of skin: Secondary | ICD-10-CM | POA: Diagnosis not present

## 2017-11-20 DIAGNOSIS — R002 Palpitations: Secondary | ICD-10-CM | POA: Diagnosis not present

## 2017-11-20 DIAGNOSIS — R4782 Fluency disorder in conditions classified elsewhere: Secondary | ICD-10-CM

## 2017-11-20 DIAGNOSIS — G35 Multiple sclerosis: Secondary | ICD-10-CM | POA: Diagnosis not present

## 2017-11-20 DIAGNOSIS — R0789 Other chest pain: Secondary | ICD-10-CM | POA: Diagnosis not present

## 2017-11-20 DIAGNOSIS — R5383 Other fatigue: Secondary | ICD-10-CM

## 2017-11-20 DIAGNOSIS — F419 Anxiety disorder, unspecified: Secondary | ICD-10-CM

## 2017-11-20 MED ORDER — NITROFURANTOIN MONOHYD MACRO 100 MG PO CAPS: 100.0000 mg | ORAL_CAPSULE | Freq: Two times a day (BID) | ORAL | 0 refills | Status: DC

## 2017-11-20 MED ORDER — ESCITALOPRAM OXALATE 10 MG PO TABS: 10.0000 mg | ORAL_TABLET | Freq: Every day | ORAL | 11 refills | Status: DC

## 2017-11-20 NOTE — Progress Notes (Signed)
[facility]  PATIENT: Lindsey Phelps DOB: 01-19-1981  REFERRING DOCTOR OR PCP:  Lindsey Phelps SOURCE: Patient, notes from Lindsey Phelps, laboratory results.  _________________________________   HISTORICAL  CHIEF COMPLAINT:  Chief Complaint  Patient presents with  . Follow-up    pt alone, pt here today to discuss a few things, ? Tysabri side effects.     HISTORY OF PRESENT ILLNESS:  Lindsey Phelps is a 38 year old woman with numbness, word finding difficulties, balance issues and other neurologic symptoms.  UPDATE 11/20/2017: She tolerated the first two infusions well but has noted pressure in her sinuses and altered since the thirs infusion.    She is also reporting more pain in her legs and muscle cramps the last month.    She also has a stabbing pain in her chest similar to how she felt with pneumonia.   She continues to note word finding errors and it seems to be more fluctuating.     She had a 40 minute episode of palpitations/tachycardia and increased anxiety a few weeks ago.    She still notes some numbness in her mouth but it is different.    IMPRESSION: This MRI of the brain with and without contrast shows the following: 1.    Multiple T2/flair hyperintense foci in the juxtacortical and periventricular white matter in a pattern and configuration consistent with chronic demyelinating plaque associated with multiple sclerosis.  When compared to the prior study 07/28/2017, lesions that enhanced on the previous study no longer do so.  There are no new lesions.  The previous MRI showed severe edema associated with most of the foci,  consistent with tumefactive MS. 2.    There is a normal enhancement pattern and there are no acute findings.  Update 08/06/2017: Since the last visit, her cerebrospinal fluid was borderline with 3 oligoclonal bands. MRI of the spinal cord did not show any additional lesions. There was no evidence of tumor on chest/abdomen/pelvic CT scan.      Additionally  she saw Dr. Venetia Phelps in neurosurgery. He also ran her case by neuroradiology and neuro-oncology. After the steroids, there was an improved appearance on the MRI. There were no new lesions.   I discussed with body that the likelihood that she has tumefactive MS is probably higher than the likelihood of a CNS lymphoma or other disorder.   Therefore an option would be to hold off on a brain biopsy and do several months treatment and then reimage. On re-imaging, if there is worsening, we would need to reconsider a brain biopsy.  Clinically, her speech is better.  Balance is better.    She notes positional numbness in her hands, worse with tasks like holding a phone.  Sometimes she wakes up with the numbness.    It then improves.      On Buspar, anxiety improved but she got headaches.    On 5 mg po bid, she is still getting headache but they are tolerable and her anxiety is still better.    Update 07/09/2017: She woke up Friday morning with more aphasia and dysphagia.   She has had some difficulty with her gait but it was no worse over the last month.   She also has had more trouble with cognition and processing speed and executive functioning over the past month.    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    She has noted mild visual changes over time but notes her glasses are old and  no asymmetry or color vision changes.   She denies any bladder issues.    She feels much more tired over the past 3 months.     She sleeps ok if she takes valium at night.     She was admitted to [facility] last week when she presented with worsening gait and worsening aphasia. An MRI was performed. Besides the left frontal lesion noted on the previous MRI, she had 4 additional large edema tenderness lesions that enhanced, one in the left frontal lobe, 2 in the right frontal lobe and another in the right temporal lobe. He also has a couple of punctate T2/FLAIR hyperintense foci that are less  specific areas the small lesions did not enhance.   She received 2 days of IV Solu-Medrol and was discharged yesterday.  Update 06/11/2017: I first saw her 03/31/2017.   Was concerned about her word finding difficulties and an MRI of the brain was performed. It showed a subacute left frontal stroke. There were a few other punctate T2/FLAIR hyperintense foci consistent with minimal chronic microvascular ischemic change.   I was most concerned about an embolic etiology. The carotid ultrasound was essentially normal. The cardiac event monitor was essentially normal.  A TEE showed a normal ejection fraction of 60-65 percent.   There was no evidence of a patent foramen ovale or other right-to-left shunt. The study was felt to be normal.  She is on aspirin, metoprolol and Crestor.  Here to the previous visit, she feels her speech is not much  Better abd she still has some aphasia and feels she needs to concentrate more with her speech.  She feels her right arm is still clumsy.   She has noted she spells words wrong a lot and her typing is slowed with more errors.   She still has some slurring. There are no new symptoms.l;  She also has some anxiety and BuSpar was started.   It helped some but may have contributed to dizziness and headaches so the dose was decreased to 5 mg twice a day.    Headaches are less frequent in general (she was having more at last visit).   She will get occasional sharp pains.    She used to smoke but none in 4 years (quit after dad had lung cancer). No OCP (had hysterectomy)  She has FH of strokes and MI's .    From 03/31/2017: I had the pleasure seeing you patient, Lindsey Phelps, at [facility] for neurologic consultation regarding her numbness, word finding difficulties, balance issues and other neurologic symptoms.  She is a 37 yo woman who had an episode with chest pain and bilateral arm numbness on 01/27/2016 and was taken to the ED (workup was normal).  She  who noted numbness in her jaw at night about 5 months ago when starting to fall asleep.  In August 2018, she had another episode of chest tightness and arm numbness and went to an ED.   Since then, she has noted more symptoms such as slurred speech, verbal fluency issues, leg weakness and balance issues.    These symptoms mostly come and go for 10 - 30 minutes and now occur daily.   They occur randomly and in any position (sitting, standing, walking).    She also notes vision is blurry and she has episodes of worsening blurry vision, also lasting 10-30 minutes.   She has vertigo that is mild but constant.   It intensifies for a  few minutes at a time sometimes while she is walking.      She has had panic attacks since 2016 after her brother was in a serious accident but she feels current symptoms are different.  She takes valium prn, just a few a month.  Many years ago, she was on Celexa.   She continues to note episodes of palpitations and tachycardia.   Metoprolol was started with benefit.     I reviewed the laboratory results from 02/21/2017. B12 is low-normal at 289. TSH, CBC and CMP are normal. LDL  and triglycerides were slightly elevated.   01/25/2017 CTA of the chest and CXR were normal.   She had a 30 day event monitor that showed intermittent tachycardia by her reports.  An Echo ws reportedly normal.    REVIEW OF SYSTEMS: Constitutional: No fevers, chills, sweats, or change in appetite.   She has mild sleep onset insomnia Eyes: No visual changes, double vision, eye pain Ear, nose and throat: No hearing loss, ear pain, nasal congestion, sore throat Cardiovascular: No chest pain.  Has intermittent tachycardia.  Respiratory: No shortness of breath at rest or with exertion.   No wheezes GastrointestinaI: No nausea, vomiting, diarrhea, abdominal pain, fecal incontinence Genitourinary: No dysuria, urinary retention or frequency.  No nocturia. Musculoskeletal: No neck pain, back  pain Integumentary: No rash, pruritus, skin lesions Neurological: as above Psychiatric: No depression at this time.  H/o panic attacks. Endocrine: No palpitations, diaphoresis, change in appetite, change in weigh or increased thirst Hematologic/Lymphatic: No anemia, purpura, petechiae. Allergic/Immunologic: has some seasonal allergies  ALLERGIES: Allergies  Allergen Reactions  . Darvocet [Propoxyphene N-Acetaminophen] Hives  . Demerol Hives  . Adhesive [Tape] Rash and Other (See Comments)    Including bandaids - takes skin off, peels    HOME MEDICATIONS:  Current Outpatient Medications:  .  diazepam (VALIUM) 10 MG tablet, Take 10 mg by mouth at bedtime as needed for anxiety. , Disp: , Rfl:  .  ibuprofen (ADVIL,MOTRIN) 200 MG tablet, Take 400 mg by mouth every 6 (six) hours as needed for headache or moderate pain., Disp: , Rfl:  .  levocetirizine (XYZAL) 5 MG tablet, Take 5 mg by mouth daily., Disp: , Rfl: 11 .  metoprolol tartrate (LOPRESSOR) 25 MG tablet, TAKE ONE TABLET BY MOUTH TWICE DAILY, Disp: 60 tablet, Rfl: 6 .  natalizumab (TYSABRI) 300 MG/15ML injection, Inject 300 mg into the vein every 28 (twenty-eight) days., Disp: , Rfl:  .  escitalopram (LEXAPRO) 10 MG tablet, Take 1 tablet (10 mg total) by mouth daily., Disp: 30 tablet, Rfl: 11 .  nitrofurantoin, macrocrystal-monohydrate, (MACROBID) 100 MG capsule, Take 1 capsule (100 mg total) by mouth 2 (two) times daily., Disp: 20 capsule, Rfl: 0 No current facility-administered medications for this visit.   Facility-Administered Medications Ordered in Other Visits:  .  gadopentetate dimeglumine (MAGNEVIST) injection 18 mL, 18 mL, Intravenous, Once PRN, Tayven Renteria A, MD .  gadopentetate dimeglumine (MAGNEVIST) injection 18 mL, 18 mL, Intravenous, Once PRN, Paelyn Smick, Pearletha Furl, MD  PAST MEDICAL HISTORY: Past Medical History:  Diagnosis Date  . Anxiety   . Cold   . Heart palpitations   . High cholesterol   . History of IBS    . Hypertension   . Stroke [facility])     PAST SURGICAL HISTORY: Past Surgical History:  Procedure Laterality Date  . ABDOMINAL HYSTERECTOMY    . BLADDER NECK SUSPENSION    . CHOLECYSTECTOMY    . COLONOSCOPY  01/23/2011  Procedure: COLONOSCOPY;  Surgeon: Malissa Hippo, MD;  Location: [facility];  Service: Endoscopy;  Laterality: N/A;  . LAPAROSCOPIC TUBAL LIGATION    . TEE WITHOUT CARDIOVERSION N/A 05/05/2017   Procedure: TRANSESOPHAGEAL ECHOCARDIOGRAM (TEE);  Surgeon: Laqueta Linden, MD;  Location: [facility];  Service: Cardiovascular;  Laterality: N/A;    FAMILY HISTORY: Family History  Problem Relation Age of Onset  . Diabetes Father   . Hypertension Father   . Colon polyps Father   . Lung cancer Father   . Hypertension Mother   . Coronary artery disease Mother   . COPD Mother     SOCIAL HISTORY:  Social History   Socioeconomic History  . Marital status: Married    Spouse name: Not on file  . Number of children: Not on file  . Years of education: Not on file  . Highest education level: Not on file  Occupational History  . Not on file  Social Needs  . Financial resource strain: Not on file  . Food insecurity:    Worry: Not on file    Inability: Not on file  . Transportation needs:    Medical: Not on file    Non-medical: Not on file  Tobacco Use  . Smoking status: Former Smoker    Packs/day: 0.50    Years: 15.00    Pack years: 7.50    Types: Cigarettes    Last attempt to quit: 09/16/2012    Years since quitting: 5.1  . Smokeless tobacco: Never Used  Substance and Sexual Activity  . Alcohol use: Yes    Alcohol/week: 0.6 oz    Types: 1 Shots of liquor per week    Comment: every few months  . Drug use: No  . Sexual activity: Yes    Birth control/protection: Surgical  Lifestyle  . Physical activity:    Days per week: Not on file    Minutes per session: Not on file  . Stress: Not on file  Relationships  . Social connections:    Talks on  phone: Not on file    Gets together: Not on file    Attends religious service: Not on file    Active member of club or organization: Not on file    Attends meetings of clubs or organizations: Not on file    Relationship status: Not on file  . Intimate partner violence:    Fear of current or ex partner: Not on file    Emotionally abused: Not on file    Physically abused: Not on file    Forced sexual activity: Not on file  Other Topics Concern  . Not on file  Social History Narrative  . Not on file     PHYSICAL EXAM  Vitals:   11/20/17 0806  BP: 112/80  Pulse: 78  Weight: 199 lb (90.3 kg)  Height: 5\' 6"  (1.676 m)    Body mass index is 32.12 kg/m.   General: The patient is well-developed and well-nourished and in no acute distress    Neurologic Exam  Mental status: The patient is alert and oriented x 3 at the time of the examination. The patient has apparent normal recent and remote memory, with an apparently normal attention span and concentration ability.  Speech is slowed and mildly slurred but she is able to name objects without difficulty couple appear phrasing errors during our conversation. There is a general slowness of her thought process.     Cranial nerves: Extraocular movements  are full.  Facial strength and sensation are normal. Trapezius strength is strong. No dysarthria is noted.  The tongue is midline, and the patient has symmetric elevation of the soft palate. No obvious hearing deficits are noted.  Motor:  Muscle bulk is normal.   Tone is normal. Strength is 5/5  Sensory: Intact sensation to touch and vibration in the arms and legs.   Coordination: Cerebellar testing reveals good finger-nose-finger and heel-to-shin bilaterally.  Gait and station: Station is normal.  The gait is better but still minimally wide and the tandem gait is mildly wide. Romberg is negative.   Reflexes: Deep tendon reflexes are symmetric and 3 bilaterally in arms and knees and 2  at the ankles.  .   Plantar responses are flexor.    DIAGNOSTIC DATA (LABS, IMAGING, TESTING) - I reviewed patient records, labs, notes, testing and imaging myself where available.  Lab Results  Component Value Date   WBC 6.1 07/29/2017   HGB 13.4 07/29/2017   HCT 40.3 07/29/2017   MCV 92 07/29/2017   PLT 253 07/29/2017      Component Value Date/Time   NA 139 07/06/2017 0853   K 3.6 07/06/2017 0853   CL 107 07/06/2017 0853   CO2 22 07/06/2017 0853   GLUCOSE 95 07/06/2017 0853   BUN 13 07/06/2017 0853   CREATININE 0.76 07/06/2017 0853   CALCIUM 9.0 07/06/2017 0853   PROT 7.4 07/06/2017 0853   ALBUMIN 4.6 07/07/2017 0925   AST 32 07/06/2017 0853   ALT 30 07/06/2017 0853   ALKPHOS 56 07/06/2017 0853   BILITOT 0.5 07/06/2017 0853   GFRNONAA >60 07/06/2017 0853   GFRAA >60 07/06/2017 0853       ASSESSMENT AND PLAN  Multiple sclerosis (HCC) - Plan: Urinalysis, Routine w reflex microscopic, Culture, Urine, Ambulatory referral to Speech Therapy, CBC with Differential/Platelet, Stratify JCV Antibody Test ([facility])  Chest pain, unspecified type - Plan: DG Chest 2 View  Palpitations  Numbness  Fluency disorder associated with underlying disease - Plan: Ambulatory referral to Speech Therapy  Anxiety  Urinary dysfunction - Plan: Urinalysis, Routine w reflex microscopic, Culture, Urine  Other fatigue - Plan: DG Chest 2 View   1.      We discussed Tysabri in detail. I discussed how the JCV antibody can be used to guide [place] to the duration of her therapy. If she uses middle or high positive, I would want her on a different medication year if she continues on a disease modifying therapy. If she is JCV antibody negative, she can continue on Tysabri long-term.   We should be able to get her Tysabri started within by the end of next week 2.   Because of the headaches on BuSpar, she will change to Zoloft. 3.   Will return to see me in 3 months or sooner if there are new or  worsening neurologic symptoms.  Peni Rupard A. Epimenio Foot, MD, [facility] 11/20/2017, 8:59 AM Certified in Neurology, Clinical Neurophysiology, Sleep Medicine, Pain Medicine and Neuroimaging  [facility] [address], [address] [place], [place] [address] [phone]

## 2017-11-21 LAB — CBC WITH DIFFERENTIAL/PLATELET
Basophils Absolute: 0 10*3/uL (ref 0.0–0.2)
Basos: 0 %
EOS (ABSOLUTE): 0.6 10*3/uL — AB (ref 0.0–0.4)
Eos: 8 %
Hematocrit: 36.3 % (ref 34.0–46.6)
Hemoglobin: 12.1 g/dL (ref 11.1–15.9)
IMMATURE GRANULOCYTES: 0 %
Immature Grans (Abs): 0 10*3/uL (ref 0.0–0.1)
LYMPHS ABS: 3.7 10*3/uL — AB (ref 0.7–3.1)
Lymphs: 51 %
MCH: 30.4 pg (ref 26.6–33.0)
MCHC: 33.3 g/dL (ref 31.5–35.7)
MCV: 91 fL (ref 79–97)
MONOS ABS: 0.4 10*3/uL (ref 0.1–0.9)
Monocytes: 5 %
NEUTROS PCT: 36 %
Neutrophils Absolute: 2.7 10*3/uL (ref 1.4–7.0)
PLATELETS: 219 10*3/uL (ref 150–450)
RBC: 3.98 x10E6/uL (ref 3.77–5.28)
RDW: 14.4 % (ref 12.3–15.4)
WBC: 7.4 10*3/uL (ref 3.4–10.8)

## 2017-11-21 LAB — URINALYSIS, ROUTINE W REFLEX MICROSCOPIC
Bilirubin, UA: NEGATIVE
Glucose, UA: NEGATIVE
KETONES UA: NEGATIVE
LEUKOCYTES UA: NEGATIVE
Nitrite, UA: NEGATIVE
PH UA: 8 — AB (ref 5.0–7.5)
Protein, UA: NEGATIVE
RBC, UA: NEGATIVE
Specific Gravity, UA: 1.023 (ref 1.005–1.030)
Urobilinogen, Ur: 0.2 mg/dL (ref 0.2–1.0)

## 2017-11-22 LAB — URINE CULTURE: ORGANISM ID, BACTERIA: NO GROWTH

## 2017-11-23 ENCOUNTER — Telehealth: Payer: Self-pay | Admitting: *Deleted

## 2017-11-23 NOTE — Telephone Encounter (Signed)
Spoke with Kendal Hymen and reviewed below CXR results.  She verbalized understanding of same/fim

## 2017-11-23 NOTE — Telephone Encounter (Signed)
-----   Message from Asa Lente, MD sent at 11/22/2017  3:13 PM EDT ----- Please let the patient know that the chest X-ray is normal

## 2017-11-23 NOTE — Telephone Encounter (Signed)
-----   Message from Asa Lente, MD sent at 11/22/2017  3:13 PM EDT ----- Please let the patient know that the blood count and urinalysis are ok

## 2017-11-23 NOTE — Telephone Encounter (Signed)
Spoke with Kendal Hymen and reviewed below lab results.  She verbalized understanding of same/fim

## 2017-12-01 ENCOUNTER — Encounter: Payer: Self-pay | Admitting: *Deleted

## 2017-12-01 DIAGNOSIS — R9089 Other abnormal findings on diagnostic imaging of central nervous system: Secondary | ICD-10-CM | POA: Diagnosis not present

## 2017-12-01 DIAGNOSIS — G35 Multiple sclerosis: Secondary | ICD-10-CM | POA: Diagnosis not present

## 2017-12-09 ENCOUNTER — Ambulatory Visit: Payer: BLUE CROSS/BLUE SHIELD | Admitting: Nurse Practitioner

## 2017-12-15 ENCOUNTER — Encounter (HOSPITAL_COMMUNITY): Payer: Self-pay | Admitting: Speech Pathology

## 2017-12-15 ENCOUNTER — Other Ambulatory Visit: Payer: Self-pay

## 2017-12-15 ENCOUNTER — Ambulatory Visit (HOSPITAL_COMMUNITY): Payer: BLUE CROSS/BLUE SHIELD | Attending: Neurology | Admitting: Speech Pathology

## 2017-12-15 DIAGNOSIS — R41841 Cognitive communication deficit: Secondary | ICD-10-CM | POA: Insufficient documentation

## 2017-12-15 NOTE — Therapy (Signed)
[place], [place], [address] Phone: [phone]   Fax:  [phone]  Speech Language Pathology Evaluation  Patient Details  Name: Lindsey Phelps MRN: [id number] Date of Birth: 1980-12-29 Referring Provider: Arlice Colt, MD   Encounter Date: 12/15/2017  End of Session - 12/15/17 1803    Visit Number  1    Number of Visits  5    Date for SLP Re-Evaluation  01/22/18    Authorization Type  BCBS 70-30% coinsurance, deductible met, after 25 visits required medical review    SLP Start Time  1600    SLP Stop Time   1700    SLP Time Calculation (min)  60 min    Activity Tolerance  Patient tolerated treatment well       Past Medical History:  Diagnosis Date  . Anxiety   . Cold   . Heart palpitations   . High cholesterol   . History of IBS   . Hypertension   . Stroke [facility])     Past Surgical History:  Procedure Laterality Date  . ABDOMINAL HYSTERECTOMY    . BLADDER NECK SUSPENSION    . CHOLECYSTECTOMY    . COLONOSCOPY  01/23/2011   Procedure: COLONOSCOPY;  Surgeon: Rogene Houston, MD;  [facility];  Service: Endoscopy;  Laterality: N/A;  . LAPAROSCOPIC TUBAL LIGATION    . TEE WITHOUT CARDIOVERSION N/A 05/05/2017   Procedure: TRANSESOPHAGEAL ECHOCARDIOGRAM (TEE);  Surgeon: Herminio Commons, MD;  Location: [facility];  Service: Cardiovascular;  Laterality: N/A;    There were no vitals filed for this visit.  Subjective Assessment - 12/15/17 1754    Subjective  "My speech is worse when I am tired."    Currently in Pain?  No/denies         SLP Evaluation [facility] - 12/15/17 1754      SLP Visit Information   SLP Received On  12/15/17    Referring Provider  Arlice Colt, MD    Onset Date  04/07/2017    Medical Diagnosis  Multiple Sclerosis      Subjective   Subjective  "My speech is worse when I am tired."    Patient/Family Stated Goal  "I want some advice on how to help with MS."      General Information   HPI  Lindsey Phelps is a 37 yo female who was referred for SLE by Dr. Arlice Colt, her neurologist following her for presumed tumefactive MS. Pt has had multiple MRIs (November 2018, February 2019 x2, and June 2019) with the most recent showing: Multiple T2/flair hyperintense foci in the juxtacortical and periventricular white matter in a pattern and configuration consistent with chronic demyelinating plaque associated with multiple sclerosis. When compared to the prior study 07/28/2017, lesions that enhanced on the previous study no longer do so. There are no new lesions. The previous MRI showed severe edema associated with most of the foci, consistent with tumefactive MS. Pt referred for SLE due to some aphasia and dysfluency.     Behavioral/Cognition  Alert and cooperative    Mobility Status  ambulatory      Balance Screen   Has the patient fallen in the past 6 months  No    Has the patient had a decrease in activity level because of a fear of falling?   No    Is the patient reluctant to leave their home because of a fear of falling?  No      Prior Functional Status   Cognitive/Linguistic Baseline  Within functional limits    Type of Home  House     Lives With  Spouse husband is a Camera operator and travels frequently    Available Support  Family    Education  12th grade    Vocation  Full time employment [employer]      Cognition   Overall Cognitive Status  Impaired/Different from baseline    Area of Impairment  Memory;Attention    Current Attention Level  Sustained    Memory  Decreased short-term memory    Memory  Impaired    Memory Impairment  Retrieval deficit    Awareness  Appears intact    Problem Solving  Appears intact      Auditory Comprehension   Overall Auditory Comprehension  Appears within functional limits for tasks assessed    Yes/No Questions  Within Functional Limits    Commands  Within Functional Limits     Conversation  Complex      Visual Recognition/Discrimination   Discrimination  Within Function Limits      Reading Comprehension   Reading Status  Not tested      Verbal Expression   Overall Verbal Expression  Impaired    Initiation  No impairment    Automatic Speech  Name;Social Response    Level of Generative/Spontaneous Verbalization  Conversation    Repetition  No impairment    Naming  Impairment    Responsive  76-100% accurate    Confrontation  75-100% accurate    Convergent  75-100% accurate    Divergent  50-74% accurate    Other Naming Comments  -- word finding deficits noted in conversation    Pragmatics  No impairment    Effective Techniques  Phonemic cues;Semantic cues    Non-Verbal Means of Communication  Not applicable      Written Expression   Dominant Hand  Right    Written Expression  Not tested      Oral Motor/Sensory Function   Overall Oral Motor/Sensory Function  Appears within functional limits for tasks assessed      Motor Speech   Overall Motor Speech  Appears within functional limits for tasks assessed    Respiration  Within functional limits    Phonation  Normal    Resonance  Within functional limits    Articulation  Within functional limitis    Intelligibility  Intelligible    Motor Planning  Witnin functional limits    Motor Speech Errors  Not applicable    Phonation  WFL      Standardized Assessments   Standardized Assessments   Montreal Cognitive Assessment (MOCA)    Montreal Cognitive Assessment (MOCA)   28/30 (-1 serial 7s and -1 fluency/naming)         SLP Short Term Goals - 12/15/17 1809      SLP SHORT TERM GOAL #1   Title  Pt will complete complex divergent naming tasks with 90% acc with min assist for strategies.    Baseline  80%   Time  4    Period  Weeks    Status  New    Target Date  01/22/18      SLP SHORT TERM GOAL #2   Title  Pt will complete complex word finding tasks (naming to description and providing description  of words) with 90% acc with min assist for strategies.   Baseline  83%  Time  4    Period  Weeks    Status  New    Target Date  01/22/18      SLP SHORT TERM GOAL #3   Title  Pt will identify and implement attention and memory strategies with indirect cues after introduction specific to task.   Baseline  Pt uses planner only at this time   Time  4    Period  Weeks    Target Date  01/22/18         Plan - 12/15/17 1806    Clinical Impression Statement  Pt presents with mild cognitive linguistic deficits characterized by difficulty with multi-tasking, complex attention tasks, working memory, and word finding difficulties in conversation. She was given the MoCA and scored within normal limits (28/30), however Pt with subjective complaints of changes in cognitive linguistic abilities. Pt will benefit from skilled SLP in order to address the above impairments, maximize independence, and decrease burden of care.   Speech Therapy Frequency  1x /week    Duration  4 weeks    Treatment/Interventions  Compensatory strategies;Cueing hierarchy;Functional tasks;Patient/family education;Cognitive reorganization;Compensatory techniques;Internal/external aids;SLP instruction and feedback    Potential to Achieve Goals  Good    SLP Home Exercise Plan  Pt will be independent with HEP to facilitate carryover of treatment strategies and techniques in home environment.    Consulted and Agree with Plan of Care  Patient       Patient will benefit from skilled therapeutic intervention in order to improve the following deficits and impairments:   Cognitive communication deficit    Problem List Patient Active Problem List   Diagnosis Date Noted  . Urinary dysfunction 11/20/2017  . Multiple sclerosis ([place]) 07/06/2017  . Speech disturbance 06/11/2017  . Stroke ([place]) 04/07/2017  . Numbness 03/31/2017  . Vertigo 03/31/2017  . Slurred speech 03/31/2017  . Visual disturbance 03/31/2017  . Anxiety  03/31/2017  . Gait disturbance 03/31/2017  . Leg swelling 12/30/2013  . Palpitations 09/16/2013  . Chest pain 09/16/2013   Thank you,  Genene Churn, [address]  [facility] 12/15/2017, [place] [address] [place], [place], [address] Phone: [phone]   Fax:  [phone]  Name: DENICE CARDON MRN: [id number] Date of Birth: 1980/06/07

## 2017-12-23 ENCOUNTER — Telehealth (HOSPITAL_COMMUNITY): Payer: Self-pay | Admitting: Speech Pathology

## 2017-12-23 NOTE — Telephone Encounter (Signed)
l/m need to R/s Dabney will be on vacation. NF 12/23/17 °

## 2017-12-24 ENCOUNTER — Encounter (HOSPITAL_COMMUNITY): Payer: Self-pay | Admitting: Speech Pathology

## 2017-12-24 ENCOUNTER — Ambulatory Visit (HOSPITAL_COMMUNITY): Payer: BLUE CROSS/BLUE SHIELD | Admitting: Speech Pathology

## 2017-12-24 DIAGNOSIS — R41841 Cognitive communication deficit: Secondary | ICD-10-CM

## 2017-12-24 NOTE — Therapy (Signed)
[place], [place], [address] Phone: [phone]   Fax:  [phone]  Speech Language Pathology Treatment  Patient Details  Name: Lindsey Phelps MRN: [id number] Date of Birth: [birth year] Referring Provider: Arlice Colt, MD   Encounter Date: 12/24/2017  End of Session - 12/24/17 1757    Visit Number  2    Number of Visits  5    Date for SLP Re-Evaluation  01/22/18    Authorization Type  BCBS 70-30% coinsurance, deductible met, after 25 visits required medical review    SLP Start Time  1600    SLP Stop Time   1658    SLP Time Calculation (min)  58 min    Activity Tolerance  Patient tolerated treatment well       Past Medical History:  Diagnosis Date  . Anxiety   . Cold   . Heart palpitations   . High cholesterol   . History of IBS   . Hypertension   . Stroke [facility])     Past Surgical History:  Procedure Laterality Date  . ABDOMINAL HYSTERECTOMY    . BLADDER NECK SUSPENSION    . CHOLECYSTECTOMY    . COLONOSCOPY  01/23/2011   Procedure: COLONOSCOPY;  Surgeon: Rogene Houston, MD;  [facility];  Service: Endoscopy;  Laterality: N/A;  . LAPAROSCOPIC TUBAL LIGATION    . TEE WITHOUT CARDIOVERSION N/A 05/05/2017   Procedure: TRANSESOPHAGEAL ECHOCARDIOGRAM (TEE);  Surgeon: Herminio Commons, MD;  [facility];  Service: Cardiovascular;  Laterality: N/A;    There were no vitals filed for this visit.  Subjective Assessment - 12/24/17 1752    Subjective  "I stay very hot."    Currently in Pain?  No/denies       ADULT SLP TREATMENT - 12/24/17 1753      General Information   Behavior/Cognition  Alert;Cooperative;Pleasant mood    Patient Positioning  Upright in chair    Oral care provided  N/A    HPI  Lindsey Phelps is a 37 yo female who was referred for SLE by Dr. Arlice Colt, her neurologist following her for presumed tumefactive MS. Pt has had multiple MRIs (November 2018,  February 2019 x2, and June 2019) with the most recent showing: Multiple T2/flair hyperintense foci in the juxtacortical and periventricular white matter in a pattern and configuration consistent with chronic demyelinating plaque associated with multiple sclerosis.  When compared to the prior study 07/28/2017, lesions that enhanced on the previous study no longer do so.  There are no new lesions.  The previous MRI showed severe edema associated with most of the foci, consistent with tumefactive MS. Pt referred for SLE due to some aphasia and dysfluency.       Treatment Provided   Treatment provided  Cognitive-Linquistic      Pain Assessment   Pain Assessment  No/denies pain      Cognitive-Linquistic Treatment   Treatment focused on  Cognition;Patient/family/caregiver education;Aphasia    Skilled Treatment  Review and identification of appropriate memory and attention strategies, word description tasks, naming to description, cues for strategies, discussion of personal assessment of attention and concentration      Assessment / Recommendations / Plan   Plan  Continue with current plan of care      Progression Toward Goals   Progression toward goals  Progressing toward goals       SLP Education - 12/24/17 1756    Education Details  Suggested that Pt use a planner (already doing) and daily journal to help track symptoms, feelings, and daily activities    Person(s) Educated  Patient    Methods  Explanation    Comprehension  Verbalized understanding       SLP Short Term Goals - 12/24/17 1758      SLP SHORT TERM GOAL #1   Title  Pt will complete complex divergent naming tasks with 90% acc with min assist for strategies.    Baseline  80%    Time  4    Period  Weeks    Status  On-going      SLP SHORT TERM GOAL #2   Title   Pt will complete complex word finding tasks (naming to description and providing description of words) with 90% acc with min assist for strategies.    Baseline  82%     Time  4    Period  Weeks    Status  On-going      SLP SHORT TERM GOAL #3   Title  Pt will identify and implement attention and memory strategies with indirect cues after introduction specific to task.    Baseline  Uses planner only at this time    Time  4    Period  Weeks    Status  On-going       SLP Long Term Goals - 12/24/17 1758      SLP LONG TERM GOAL #1   Title  Same as short term goals       Plan - 12/24/17 1758    Clinical Impression Statement Pt completed self assessment for attention and concentration inventory and identified several areas of concern. She reports that she is able to function at work and home, but notices difficulty with multitasking, reduced concentration, reduced ability to complete tasks, remembering instructions, repeating stories, and reduced memory for instructions and activities. SLP further explored ways to compensate for difficulties at work and home. She organizes her environment and has established routines to help currently. She is unable to alter her work schedule, however it would likely be better for her to work the early morning shift due to end of day fatigue. She completed picture description task with 100% accuracy with use of multimodality cues and was able to name to description with 85% acc. Continue plan of care and use Taboo game next session.    Speech Therapy Frequency  1x /week    Duration  4 weeks    Treatment/Interventions  Compensatory strategies;Cueing hierarchy;Functional tasks;Patient/family education;Cognitive reorganization;Compensatory techniques;Internal/external aids;SLP instruction and feedback    Potential to Achieve Goals  Good    SLP Home Exercise Plan  Pt will be independent with HEP to facilitate carryover of treatment strategies and techniques in home environment.    Consulted and Agree with Plan of Care  Patient       Patient will benefit from skilled therapeutic intervention in order to improve the following  deficits and impairments:   Cognitive communication deficit    Problem List Patient Active Problem List   Diagnosis Date Noted  . Urinary dysfunction 11/20/2017  . Multiple sclerosis ([place]) 07/06/2017  . Speech disturbance 06/11/2017  . Stroke ([place]) 04/07/2017  . Numbness 03/31/2017  . Vertigo 03/31/2017  . Slurred speech 03/31/2017  . Visual disturbance 03/31/2017  . Anxiety 03/31/2017  . Gait disturbance 03/31/2017  . Leg swelling 12/30/2013  . Palpitations 09/16/2013  . Chest pain 09/16/2013   Thank you,  Genene Churn, CCC-SLP  [phone]  PORTER,DABNEY 12/24/2017, 5:59 PM  [place] [address] [place], [place], [address] Phone: [phone]   Fax:  [phone]   Name: Lindsey Phelps MRN: [id number] Date of Birth: 01-30-1981

## 2017-12-29 ENCOUNTER — Telehealth (HOSPITAL_COMMUNITY): Payer: Self-pay | Admitting: Speech Pathology

## 2017-12-29 ENCOUNTER — Ambulatory Visit (HOSPITAL_COMMUNITY): Payer: BLUE CROSS/BLUE SHIELD | Admitting: Speech Pathology

## 2017-12-29 ENCOUNTER — Telehealth: Payer: Self-pay | Admitting: *Deleted

## 2017-12-29 DIAGNOSIS — G35 Multiple sclerosis: Secondary | ICD-10-CM | POA: Diagnosis not present

## 2017-12-29 DIAGNOSIS — R9089 Other abnormal findings on diagnostic imaging of central nervous system: Secondary | ICD-10-CM | POA: Diagnosis not present

## 2017-12-29 NOTE — Telephone Encounter (Signed)
ERROR

## 2017-12-29 NOTE — Telephone Encounter (Signed)
Pt had her infusion today and is not feeling well, r/s for 01/20/2018. Harriett Sine

## 2018-01-07 ENCOUNTER — Telehealth (HOSPITAL_COMMUNITY): Payer: Self-pay | Admitting: Internal Medicine

## 2018-01-07 ENCOUNTER — Ambulatory Visit (HOSPITAL_COMMUNITY): Payer: BLUE CROSS/BLUE SHIELD | Admitting: Speech Pathology

## 2018-01-07 NOTE — Telephone Encounter (Signed)
01/07/18  Dabney called and said she needed to cx today because she had an emergency that came up

## 2018-01-11 ENCOUNTER — Ambulatory Visit (HOSPITAL_COMMUNITY): Payer: BLUE CROSS/BLUE SHIELD | Attending: Neurology | Admitting: Speech Pathology

## 2018-01-11 ENCOUNTER — Encounter: Payer: Self-pay | Admitting: *Deleted

## 2018-01-11 ENCOUNTER — Encounter (HOSPITAL_COMMUNITY): Payer: Self-pay | Admitting: Speech Pathology

## 2018-01-11 DIAGNOSIS — R41841 Cognitive communication deficit: Secondary | ICD-10-CM

## 2018-01-11 NOTE — Therapy (Signed)
[place], [place], [address] Phone: [phone]   Fax:  [phone]  Speech Language Pathology Treatment  Patient Details  Name: Lindsey Phelps MRN: [id number] Date of Birth: March 06, 1981 Referring Provider: Arlice Colt, MD   Encounter Date: 01/11/2018  End of Session - 01/11/18 2239    Visit Number  3    Number of Visits  5    Date for SLP Re-Evaluation  01/22/18    Authorization Type  BCBS   70-30% coinsurance, deductible met, after 25 visits required medical review   SLP Start Time  1350    SLP Stop Time   1435    SLP Time Calculation (min)  45 min    Activity Tolerance  Patient tolerated treatment well       Past Medical History:  Diagnosis Date  . Anxiety   . Cold   . Heart palpitations   . High cholesterol   . History of IBS   . Hypertension   . Stroke [facility])     Past Surgical History:  Procedure Laterality Date  . ABDOMINAL HYSTERECTOMY    . BLADDER NECK SUSPENSION    . CHOLECYSTECTOMY    . COLONOSCOPY  01/23/2011   Procedure: COLONOSCOPY;  Surgeon: Rogene Houston, MD;  [facility];  Service: Endoscopy;  Laterality: N/A;  . LAPAROSCOPIC TUBAL LIGATION    . TEE WITHOUT CARDIOVERSION N/A 05/05/2017   Procedure: TRANSESOPHAGEAL ECHOCARDIOGRAM (TEE);  Surgeon: Herminio Commons, MD;  [facility];  Service: Cardiovascular;  Laterality: N/A;    There were no vitals filed for this visit.  Subjective Assessment - 01/11/18 2226    Subjective  "I have been trying to practice energy conservation techniques."    Currently in Pain?  No/denies        ADULT SLP TREATMENT - 01/11/18 2227      General Information   Behavior/Cognition  Alert;Cooperative;Pleasant mood    Patient Positioning  Upright in chair    Oral care provided  N/A    HPI  Lindsey Phelps is a 37 yo female who was referred for SLE by Dr. Arlice Colt, her neurologist following her for presumed  tumefactive MS. Pt has had multiple MRIs (November 2018, February 2019 x2, and June 2019) with the most recent showing: Multiple T2/flair hyperintense foci in the juxtacortical and periventricular white matter in a pattern and configuration consistent with chronic demyelinating plaque associated with multiple sclerosis.  When compared to the prior study 07/28/2017, lesions that enhanced on the previous study no longer do so.  There are no new lesions.  The previous MRI showed severe edema associated with most of the foci, consistent with tumefactive MS. Pt referred for SLE due to some aphasia and dysfluency.       Treatment Provided   Treatment provided  Cognitive-Linquistic      Pain Assessment   Pain Assessment  No/denies pain      Cognitive-Linquistic Treatment   Treatment focused on  Cognition;Patient/family/caregiver education;Aphasia    Skilled Treatment  Review and identification of appropriate memory and attention strategies, word description tasks, naming to description, cues for strategies, discussion of personal assessment of attention and concentration      Assessment / Recommendations / Plan   Plan  All goals met;Discharge SLP treatment due to (comment)         SLP Short Term Goals - 01/11/18 2240      SLP SHORT TERM GOAL #  1   Title  Pt will complete complex divergent naming tasks with 90% acc with min assist for strategies.    Baseline  80%    Time  4    Period  Weeks    Status  Achieved      SLP SHORT TERM GOAL #2   Title   Pt will complete complex word finding tasks (naming to description and providing description of words) with 90% acc with min assist for strategies.    Baseline  82%    Time  4    Period  Weeks    Status  Achieved      SLP SHORT TERM GOAL #3   Title  Pt will identify and implement attention and memory strategies with indirect cues after introduction specific to task.    Baseline  Uses planner only at this time    Time  4    Period  Weeks     Status  Achieved       SLP Long Term Goals - 12/24/17 1758      SLP LONG TERM GOAL #1   Title  Same as short term goals       Plan - 01/11/18 2240    Clinical Impression Statement Pt reports feeling fatigued following her last transfusion and also reports left sided body pain. She was encouraged to discuss her symptoms with her MD. She reports that her speech fluency and word finding has improved and she is mindful of implementing energy conservation techniques at home and at work. SLP encouraged Pt to identify ways that may improve her work schedule. She would like to have a more consistent schedule with the same hours as opposed to the alternating schedule she has currently. Pt given a packet on energy conservation strategies. SLP discussed discharge from therapy at this time, given her improvement and acquisition of compensatory strategies to assist her going forward. Pt fees good about d/c from SLP services at this time. Will D/C from services at this time.   Speech Therapy Frequency  1x /week    Duration  4 weeks    Treatment/Interventions  Compensatory strategies;Cueing hierarchy;Functional tasks;Patient/family education;Cognitive reorganization;Compensatory techniques;Internal/external aids;SLP instruction and feedback    Potential to Achieve Goals  Good    SLP Home Exercise Plan  Pt will be independent with HEP to facilitate carryover of treatment strategies and techniques in home environment.    Consulted and Agree with Plan of Care  Patient       Patient will benefit from skilled therapeutic intervention in order to improve the following deficits and impairments:   Cognitive communication deficit    Problem List Patient Active Problem List   Diagnosis Date Noted  . Urinary dysfunction 11/20/2017  . Multiple sclerosis ([place]) 07/06/2017  . Speech disturbance 06/11/2017  . Stroke ([place]) 04/07/2017  . Numbness 03/31/2017  . Vertigo 03/31/2017  . Slurred speech 03/31/2017  .  Visual disturbance 03/31/2017  . Anxiety 03/31/2017  . Gait disturbance 03/31/2017  . Leg swelling 12/30/2013  . Palpitations 09/16/2013  . Chest pain 09/16/2013   SPEECH THERAPY DISCHARGE SUMMARY  Visits from Start of Care: 3  Current functional level related to goals / functional outcomes: Goals met   Remaining deficits: Pt endorses mild complex attention difficulties with good compensation at this time   Education / Equipment: Completed Plan: Patient agrees to discharge.  Patient goals were met. Patient is being discharged due to meeting the stated rehab goals.  ?????  Thank you,  Genene Churn, [place]  [facility] 01/11/2018, 10:43 PM  [place] [address] [place], [place], [address] Phone: [phone]   Fax:  [phone]   Name: JALEY YAN MRN: [id number] Date of Birth: 22-Mar-1981

## 2018-01-12 ENCOUNTER — Encounter (HOSPITAL_COMMUNITY): Payer: BLUE CROSS/BLUE SHIELD | Admitting: Speech Pathology

## 2018-01-20 ENCOUNTER — Encounter (HOSPITAL_COMMUNITY): Payer: BLUE CROSS/BLUE SHIELD | Admitting: Speech Pathology

## 2018-01-26 DIAGNOSIS — G35 Multiple sclerosis: Secondary | ICD-10-CM | POA: Diagnosis not present

## 2018-01-26 DIAGNOSIS — R9089 Other abnormal findings on diagnostic imaging of central nervous system: Secondary | ICD-10-CM | POA: Diagnosis not present

## 2018-02-04 ENCOUNTER — Ambulatory Visit (HOSPITAL_COMMUNITY)
Admission: RE | Admit: 2018-02-04 | Discharge: 2018-02-04 | Disposition: A | Discharge status: Home / Self Care | Payer: BLUE CROSS/BLUE SHIELD | Source: Ambulatory Visit | Attending: Family Medicine | Admitting: Family Medicine

## 2018-02-04 ENCOUNTER — Other Ambulatory Visit (HOSPITAL_COMMUNITY): Payer: Self-pay | Admitting: Family Medicine

## 2018-02-04 DIAGNOSIS — M7732 Calcaneal spur, left foot: Secondary | ICD-10-CM | POA: Diagnosis not present

## 2018-02-04 DIAGNOSIS — S99922A Unspecified injury of left foot, initial encounter: Secondary | ICD-10-CM | POA: Diagnosis not present

## 2018-02-04 DIAGNOSIS — M79672 Pain in left foot: Secondary | ICD-10-CM

## 2018-02-04 DIAGNOSIS — Z6832 Body mass index (BMI) 32.0-32.9, adult: Secondary | ICD-10-CM | POA: Diagnosis not present

## 2018-02-04 DIAGNOSIS — E6609 Other obesity due to excess calories: Secondary | ICD-10-CM | POA: Diagnosis not present

## 2018-02-04 DIAGNOSIS — M7989 Other specified soft tissue disorders: Secondary | ICD-10-CM | POA: Diagnosis not present

## 2018-02-04 DIAGNOSIS — Z1389 Encounter for screening for other disorder: Secondary | ICD-10-CM | POA: Diagnosis not present

## 2018-02-15 ENCOUNTER — Telehealth: Payer: Self-pay | Admitting: Neurology

## 2018-02-15 NOTE — Telephone Encounter (Signed)
Pt is wanting a referral to ENT. She is having stuffy nose, hard to breath daily since March. She is doubling up on allergy medication. She said the last MRI showed thickening in the nasal passages. Please call to advise

## 2018-02-15 NOTE — Telephone Encounter (Signed)
I have spoken with Lindsey Phelps and explained she should f/u with her pcp for nasal congestion; he may refer to ENT if appropriate/fim

## 2018-02-23 ENCOUNTER — Telehealth: Payer: Self-pay | Admitting: Neurology

## 2018-02-23 DIAGNOSIS — R9089 Other abnormal findings on diagnostic imaging of central nervous system: Secondary | ICD-10-CM | POA: Diagnosis not present

## 2018-02-23 DIAGNOSIS — G35 Multiple sclerosis: Secondary | ICD-10-CM | POA: Diagnosis not present

## 2018-02-23 NOTE — Telephone Encounter (Signed)
Patient requested for me to send you a message:   Last year when she had her MRI in November, she was diagnosed with a stroke on the left frontal lobe, and she was wondering if it was a misdiagnosis and she was told to stop taking her Asprin. Her speech therapist suggested that maybe it was just a lesion instead of a stroke?

## 2018-02-23 NOTE — Telephone Encounter (Signed)
Spoke with Lindsey Phelps and per RAS, explained it is ok to stop ASA 81mg , as the changes seen in her previous MRI were likely MS changes that were also suspicious for CVA. She verbalized understanding of same/fim

## 2018-02-24 ENCOUNTER — Telehealth: Payer: Self-pay | Admitting: *Deleted

## 2018-02-24 NOTE — Telephone Encounter (Signed)
LMOM that handicap parking placard application is up front [employer], for her to pick up at her convenience/fim

## 2018-02-24 NOTE — Telephone Encounter (Signed)
Pt renewal of disability parking placard on [employer].

## 2018-02-26 DIAGNOSIS — Z6832 Body mass index (BMI) 32.0-32.9, adult: Secondary | ICD-10-CM | POA: Diagnosis not present

## 2018-02-26 DIAGNOSIS — G35 Multiple sclerosis: Secondary | ICD-10-CM | POA: Diagnosis not present

## 2018-02-26 DIAGNOSIS — Z1389 Encounter for screening for other disorder: Secondary | ICD-10-CM | POA: Diagnosis not present

## 2018-02-26 DIAGNOSIS — J329 Chronic sinusitis, unspecified: Secondary | ICD-10-CM | POA: Diagnosis not present

## 2018-02-26 DIAGNOSIS — J309 Allergic rhinitis, unspecified: Secondary | ICD-10-CM | POA: Diagnosis not present

## 2018-03-03 ENCOUNTER — Emergency Department (HOSPITAL_COMMUNITY)
Admission: EM | Admit: 2018-03-03 | Discharge: 2018-03-03 | Disposition: A | Discharge status: Home / Self Care | Payer: BLUE CROSS/BLUE SHIELD | Attending: Emergency Medicine | Admitting: Emergency Medicine

## 2018-03-03 ENCOUNTER — Emergency Department (HOSPITAL_COMMUNITY): Payer: BLUE CROSS/BLUE SHIELD

## 2018-03-03 ENCOUNTER — Other Ambulatory Visit: Payer: Self-pay

## 2018-03-03 ENCOUNTER — Encounter (HOSPITAL_COMMUNITY): Payer: Self-pay | Admitting: Emergency Medicine

## 2018-03-03 DIAGNOSIS — R42 Dizziness and giddiness: Secondary | ICD-10-CM | POA: Diagnosis not present

## 2018-03-03 DIAGNOSIS — R4182 Altered mental status, unspecified: Secondary | ICD-10-CM | POA: Diagnosis not present

## 2018-03-03 DIAGNOSIS — Z87891 Personal history of nicotine dependence: Secondary | ICD-10-CM | POA: Insufficient documentation

## 2018-03-03 DIAGNOSIS — Z79899 Other long term (current) drug therapy: Secondary | ICD-10-CM | POA: Insufficient documentation

## 2018-03-03 DIAGNOSIS — R51 Headache: Secondary | ICD-10-CM | POA: Diagnosis not present

## 2018-03-03 DIAGNOSIS — H538 Other visual disturbances: Secondary | ICD-10-CM | POA: Diagnosis not present

## 2018-03-03 DIAGNOSIS — R519 Headache, unspecified: Secondary | ICD-10-CM

## 2018-03-03 DIAGNOSIS — I10 Essential (primary) hypertension: Secondary | ICD-10-CM | POA: Insufficient documentation

## 2018-03-03 HISTORY — DX: Multiple sclerosis: G35

## 2018-03-03 LAB — COMPREHENSIVE METABOLIC PANEL
ALT: 14 U/L (ref 0–44)
AST: 19 U/L (ref 15–41)
Albumin: 4.7 g/dL (ref 3.5–5.0)
Alkaline Phosphatase: 71 U/L (ref 38–126)
Anion gap: 8 (ref 5–15)
BUN: 12 mg/dL (ref 6–20)
CALCIUM: 8.8 mg/dL — AB (ref 8.9–10.3)
CHLORIDE: 105 mmol/L (ref 98–111)
CO2: 23 mmol/L (ref 22–32)
CREATININE: 0.72 mg/dL (ref 0.44–1.00)
Glucose, Bld: 85 mg/dL (ref 70–99)
Potassium: 3.4 mmol/L — ABNORMAL LOW (ref 3.5–5.1)
Sodium: 136 mmol/L (ref 135–145)
TOTAL PROTEIN: 7.9 g/dL (ref 6.5–8.1)
Total Bilirubin: 0.9 mg/dL (ref 0.3–1.2)

## 2018-03-03 LAB — CBC WITH DIFFERENTIAL/PLATELET
BASOS ABS: 0.1 10*3/uL (ref 0.0–0.1)
Basophils Relative: 1 %
EOS ABS: 0.6 10*3/uL (ref 0.0–0.7)
EOS PCT: 5 %
HCT: 36.6 % (ref 36.0–46.0)
HEMOGLOBIN: 12.7 g/dL (ref 12.0–15.0)
LYMPHS ABS: 5.4 10*3/uL — AB (ref 0.7–4.0)
Lymphocytes Relative: 46 %
MCH: 30.1 pg (ref 26.0–34.0)
MCHC: 34.7 g/dL (ref 30.0–36.0)
MCV: 86.7 fL (ref 78.0–100.0)
Monocytes Absolute: 0.6 10*3/uL (ref 0.1–1.0)
Monocytes Relative: 5 %
NEUTROS PCT: 43 %
Neutro Abs: 4.9 10*3/uL (ref 1.7–7.7)
PLATELETS: 229 10*3/uL (ref 150–400)
RBC: 4.22 MIL/uL (ref 3.87–5.11)
RDW: 13.3 % (ref 11.5–15.5)
WBC: 11.6 10*3/uL — AB (ref 4.0–10.5)

## 2018-03-03 LAB — I-STAT CREATININE, ED: Creatinine, Ser: 0.7 mg/dL (ref 0.44–1.00)

## 2018-03-03 MED ORDER — GADOBUTROL 1 MMOL/ML IV SOLN
10.0000 mL | Freq: Once | INTRAVENOUS | Status: AC | PRN
  Administered 2018-03-03: 10 mL via INTRAVENOUS

## 2018-03-03 NOTE — ED Provider Notes (Signed)
[facility] EMERGENCY DEPARTMENT Provider Note   CSN: [id number] Arrival date & time: 03/03/18  1649     History   Chief Complaint Chief Complaint  Patient presents with  . Headache    HPI Lindsey Phelps is a 37 y.o. female.  HPI Patient presents with headache and dizziness.  Also feels weak in her right leg and right arm.  Also tingling in her leg and arm.  History of multiple sclerosis.  Headache is been there for the last few days.  States she is felt a little bad.  Has had congestion for the last couple months.  She is on Tysabri infusions.  No fevers.  No abdominal pain.  No cough.  No neck pain.  States her dizziness and headache are worse if she moves her head around. Past Medical History:  Diagnosis Date  . Anxiety   . Cold   . Heart palpitations   . High cholesterol   . History of IBS   . Hypertension   . Multiple sclerosis (HCC)   . Stroke [facility])     Patient Active Problem List   Diagnosis Date Noted  . Urinary dysfunction 11/20/2017  . Multiple sclerosis (HCC) 07/06/2017  . Speech disturbance 06/11/2017  . Stroke (HCC) 04/07/2017  . Numbness 03/31/2017  . Vertigo 03/31/2017  . Slurred speech 03/31/2017  . Visual disturbance 03/31/2017  . Anxiety 03/31/2017  . Gait disturbance 03/31/2017  . Leg swelling 12/30/2013  . Palpitations 09/16/2013  . Chest pain 09/16/2013    Past Surgical History:  Procedure Laterality Date  . ABDOMINAL HYSTERECTOMY    . BLADDER NECK SUSPENSION    . CHOLECYSTECTOMY    . COLONOSCOPY  01/23/2011   Procedure: COLONOSCOPY;  Surgeon: Malissa Hippo, MD;  Location: [facility] SUITE;  Service: Endoscopy;  Laterality: N/A;  . LAPAROSCOPIC TUBAL LIGATION    . TEE WITHOUT CARDIOVERSION N/A 05/05/2017   Procedure: TRANSESOPHAGEAL ECHOCARDIOGRAM (TEE);  Surgeon: Laqueta Linden, MD;  Location: [facility] SUITE;  Service: Cardiovascular;  Laterality: N/A;     OB History    Gravida      Para      Term      Preterm      AB     Living  0     SAB      TAB      Ectopic      Multiple      Live Births               Home Medications    Prior to Admission medications   Medication Sig Start Date End Date Taking? Authorizing Provider  diazepam (VALIUM) 10 MG tablet Take 10 mg by mouth at bedtime as needed for anxiety.     [provider]  escitalopram (LEXAPRO) 10 MG tablet Take 1 tablet (10 mg total) by mouth daily. 11/20/17   Sater, Pearletha Furl, MD  ibuprofen (ADVIL,MOTRIN) 200 MG tablet Take 400 mg by mouth every 6 (six) hours as needed for headache or moderate pain.    [provider]  levocetirizine (XYZAL) 5 MG tablet Take 5 mg by mouth daily. 11/03/17   [provider]  metoprolol tartrate (LOPRESSOR) 25 MG tablet TAKE ONE TABLET BY MOUTH TWICE DAILY 10/29/17   Laqueta Linden, MD  natalizumab (TYSABRI) 300 MG/15ML injection Inject 300 mg into the vein every 28 (twenty-eight) days.    [provider]  nitrofurantoin, macrocrystal-monohydrate, (MACROBID) 100 MG capsule Take 1 capsule (  100 mg total) by mouth 2 (two) times daily. 11/20/17   Sater, Pearletha Furl, MD    Family History Family History  Problem Relation Age of Onset  . Diabetes Father   . Hypertension Father   . Colon polyps Father   . Lung cancer Father   . Hypertension Mother   . Coronary artery disease Mother   . COPD Mother     Social History Social History   Tobacco Use  . Smoking status: Former Smoker    Packs/day: 0.50    Years: 15.00    Pack years: 7.50    Types: Cigarettes    Last attempt to quit: 09/16/2012    Years since quitting: 5.4  . Smokeless tobacco: Never Used  Substance Use Topics  . Alcohol use: Yes    Alcohol/week: 1.0 standard drinks    Types: 1 Shots of liquor per week    Comment: every few months  . Drug use: No     Allergies   Darvocet [propoxyphene n-acetaminophen]; Demerol; and Adhesive [tape]   Review of Systems Review of Systems  Constitutional:  Positive for appetite change.  HENT: Positive for congestion.   Respiratory: Negative for shortness of breath.   Cardiovascular: Negative for chest pain.  Gastrointestinal: Negative for abdominal pain.  Genitourinary: Negative for flank pain.  Musculoskeletal: Negative for back pain.  Skin: Negative for rash.  Neurological: Positive for weakness, numbness and headaches.  Psychiatric/Behavioral: Negative for confusion.     Physical Exam Updated Vital Signs BP 122/75 (BP Location: Right Arm)   Pulse 80   Temp 98.1 F (36.7 C) (Oral)   Resp 18   Ht 5\' 6"  (1.676 m)   Wt 90.7 kg   SpO2 100%   BMI 32.28 kg/m   Physical Exam  Constitutional: She is oriented to person, place, and time. She appears well-developed.  Patient appears somewhat uncomfortable.  Eyes: Pupils are equal, round, and reactive to light.  Neck: Neck supple.  Cardiovascular: Normal rate.  Abdominal: Soft. There is no tenderness.  Neurological: She is alert and oriented to person, place, and time.  Good grip strength bilaterally.  Paresthesias to right hand and right lower leg.  Face symmetric.  Patient is somewhat dizzy with turning head.  Skin: No cyanosis.     ED Treatments / Results  Labs (all labs ordered are listed, but only abnormal results are displayed) Labs Reviewed  COMPREHENSIVE METABOLIC PANEL - Abnormal; Notable for the following components:      Result Value   Potassium 3.4 (*)    Calcium 8.8 (*)    All other components within normal limits  CBC WITH DIFFERENTIAL/PLATELET - Abnormal; Notable for the following components:   WBC 11.6 (*)    Lymphs Abs 5.4 (*)    All other components within normal limits  I-STAT CREATININE, ED    EKG None  Radiology Mr Laqueta Jean And Wo Contrast  Result Date: 03/03/2018 CLINICAL DATA:  Blurriness, altered mental status. History of multiple sclerosis. EXAM: MRI HEAD WITHOUT AND WITH CONTRAST TECHNIQUE: Multiplanar, multiecho pulse sequences of the brain  and surrounding structures were obtained without and with intravenous contrast. CONTRAST:  10 cc Gadavist COMPARISON:  MRI head July 28, 2017 and November 10, 2017 FINDINGS: INTRACRANIAL CONTENTS: Greater than 10 supratentorial predominant juxtacortical white matter lesions again seen without new lesions. Preexistent lesions vary from unchanged to smaller. No new lesions. Lesions demonstrate T2 shine through and low T1 signal seen with black holes of demyelination. No  cortical or infratemporal lesions. No parenchymal brain volume loss for age. No midline shift, mass effect. No abnormal intraparenchymal or extra-axial enhancement. No extra-axial fluid collections. VASCULAR: Normal major intracranial vascular flow voids present at skull base. SKULL AND UPPER CERVICAL SPINE: No abnormal sellar expansion. No suspicious calvarial bone marrow signal. Craniocervical junction maintained. SINUSES/ORBITS: The mastoid air-cells and included paranasal sinuses are well-aerated.The included ocular globes and orbital contents are non-suspicious. OTHER: None. IMPRESSION: 1. No acute intracranial process. 2. Stable number and stable to decreased size of supratentorial white matter lesions most compatible with chronic demyelination (tumefactive component). No enhancement. No parenchymal brain volume loss for age. Electronically Signed   By: Awilda Metro M.D.   On: 03/03/2018 18:44    Procedures Procedures (including critical care time)  Medications Ordered in ED Medications  gadobutrol (GADAVIST) 1 MMOL/ML injection 10 mL (10 mLs Intravenous Contrast Given 03/03/18 1818)     Initial Impression / Assessment and Plan / ED Course  I have reviewed the triage vital signs and the nursing notes.  Pertinent labs & imaging results that were available during my care of the patient were reviewed by me and considered in my medical decision making (see chart for details).     Patient with headache and dizziness.  History of  MS.  Has some balance issues.  MRI is stable to improved with her MS.  Otherwise no fevers.  Discharged home to follow-up with neurology and ENT.  Final Clinical Impressions(s) / ED Diagnoses   Final diagnoses:  Generalized headache    ED Discharge Orders    None       Benjiman Core, MD 03/03/18 1909

## 2018-03-03 NOTE — ED Triage Notes (Signed)
Patient complaining of headache and balance problems x 2 days and states today at 1000 she had episode of numbness down right leg with blurry vision that lasted approximately 5 minutes and went away. States she is still having balance issues with headache.

## 2018-03-04 NOTE — Telephone Encounter (Signed)
Spoke with Lindsey Phelps and advised that RAS has seen MRI and lab results.  If she does not continue to improve, please call back and he will work her in on Monday am.  She verbalized understanding of same/fim

## 2018-03-04 NOTE — Telephone Encounter (Signed)
Noted/fim 

## 2018-03-04 NOTE — Telephone Encounter (Signed)
Pt called stating that she went to the ED yesterday for continuing symptoms, due to numbness and weakness. Pt had an MRI and blood work done there. FYI

## 2018-03-04 NOTE — Telephone Encounter (Signed)
Ok if she does not improve I can see her on Monday

## 2018-03-05 NOTE — Telephone Encounter (Signed)
Pt called back, scheduled her for appt on 10/7 at 11 FYI

## 2018-03-08 ENCOUNTER — Encounter: Payer: Self-pay | Admitting: Neurology

## 2018-03-08 ENCOUNTER — Other Ambulatory Visit: Payer: Self-pay

## 2018-03-08 ENCOUNTER — Ambulatory Visit: Payer: BLUE CROSS/BLUE SHIELD | Admitting: Neurology

## 2018-03-08 VITALS — BP 120/79 | HR 74 | Resp 16 | Ht 66.0 in | Wt 200.5 lb

## 2018-03-08 DIAGNOSIS — G35 Multiple sclerosis: Secondary | ICD-10-CM

## 2018-03-08 DIAGNOSIS — M542 Cervicalgia: Secondary | ICD-10-CM

## 2018-03-08 DIAGNOSIS — H539 Unspecified visual disturbance: Secondary | ICD-10-CM

## 2018-03-08 DIAGNOSIS — R4782 Fluency disorder in conditions classified elsewhere: Secondary | ICD-10-CM | POA: Diagnosis not present

## 2018-03-08 DIAGNOSIS — R269 Unspecified abnormalities of gait and mobility: Secondary | ICD-10-CM | POA: Diagnosis not present

## 2018-03-08 NOTE — Progress Notes (Signed)
[facility]  PATIENT: Lindsey Phelps DOB: 1980-09-06  REFERRING DOCTOR OR PCP:  Elfredia Nevins SOURCE: Patient, notes from Dr. Sherwood Gambler, laboratory results.  _________________________________   HISTORICAL  CHIEF COMPLAINT:  Chief Complaint  Patient presents with  . Dizziness    Sts. she continues to tolerate Tysabri well.  JCV ab last checked 11/20/17 and was Negative at 0.17.  Here today with c/o constant dizziness, intermittent bilat eye pain and blurry vision, right eye worse than left, onset 8 days ago. Seen in the ER 03/03/18, where MRI brain did not show any new MS changes.  Has had constant sinus congestion since March 2019. Has an appt. with ENT for same on 03/22/18/fim  . Visual Disburbance    HISTORY OF PRESENT ILLNESS:  Lindsey Phelps is a 37 y.o. woman with MS with tumefactive lesions.  Update 03/08/2018: She is on Tysabri and tolerates it well.    The last JCV Ab titer was 0.17 negative on 11/20/2017.    She reports constant dizziness with eye pain and blurriness, R > left, for the past 8 days.      She also has had right occipital tenderness x 6-8 weeks sometimes feeling like an icepick. Last Wednesday, she was in bed and suddenly in the right leg and she had pins/needles sensation in the right hand.   She also noted worse visual disturbance bilaterally.     Sometimes swallowing seems difficult.    She gets a lot of nasal congestion.    MRI does not show sinusitis.     She was taking care of a cat with rabies and family got the rabies shots but she did not.    She was not scratched or bitten.   She last touched the cat 01/31/2018.     She went to the ED 03/03/2018 and an MRI was performed.   I personally reviewed the images.  There were no new lesions and the MRI is essentially unchanged compared to 11/10/2017 and improved compared to earlier ones..     IMPRESSION: 1. No acute intracranial process. 2. Stable number and stable to decreased size of  supratentorial white matter lesions most compatible with chronic demyelination (tumefactive component). No enhancement. No parenchymal brain volume loss for age.  UPDATE 11/20/2017: She tolerated the first two infusions well but has noted pressure in her sinuses and altered since the thirs infusion.    She is also reporting more pain in her legs and muscle cramps the last month.    She also has a stabbing pain in her chest similar to how she felt with pneumonia.   She continues to note word finding errors and it seems to be more fluctuating.     She had a 40 minute episode of palpitations/tachycardia and increased anxiety a few weeks ago.    She still notes some numbness in her mouth but it is different.    IMPRESSION: This MRI of the brain with and without contrast shows the following: 1.    Multiple T2/flair hyperintense foci in the juxtacortical and periventricular white matter in a pattern and configuration consistent with chronic demyelinating plaque associated with multiple sclerosis.  When compared to the prior study 07/28/2017, lesions that enhanced on the previous study no longer do so.  There are no new lesions.  The previous MRI showed severe edema associated with most of the foci,  consistent with tumefactive MS. 2.    There is a normal enhancement pattern and there are  no acute findings.  Update 08/06/2017: Since the last visit, her cerebrospinal fluid was borderline with 3 oligoclonal bands. MRI of the spinal cord did not show any additional lesions. There was no evidence of tumor on chest/abdomen/pelvic CT scan.     Additionally  she saw Dr. Venetia Maxon in neurosurgery. He also ran her case by neuroradiology and neuro-oncology. After the steroids, there was an improved appearance on the MRI. There were no new lesions.   I discussed with body that the likelihood that she has tumefactive MS is probably higher than the likelihood of a CNS lymphoma or other disorder.   Therefore an option would be to  hold off on a brain biopsy and do several months treatment and then reimage. On re-imaging, if there is worsening, we would need to reconsider a brain biopsy.  Clinically, her speech is better.  Balance is better.    She notes positional numbness in her hands, worse with tasks like holding a phone.  Sometimes she wakes up with the numbness.    It then improves.      On Buspar, anxiety improved but she got headaches.    On 5 mg po bid, she is still getting headache but they are tolerable and her anxiety is still better.    Update 07/09/2017: She woke up Friday morning with more aphasia and dysphagia.   She has had some difficulty with her gait but it was no worse over the last month.   She also has had more trouble with cognition and processing speed and executive functioning over the past month.    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    She has noted mild visual changes over time but notes her glasses are old and no asymmetry or color vision changes.   She denies any bladder issues.    She feels much more tired over the past 3 months.     She sleeps ok if she takes valium at night.     She was admitted to [facility] last week when she presented with worsening gait and worsening aphasia. An MRI was performed. Besides the left frontal lesion noted on the previous MRI, she had 4 additional large edema tenderness lesions that enhanced, one in the left frontal lobe, 2 in the right frontal lobe and another in the right temporal lobe. He also has a couple of punctate T2/FLAIR hyperintense foci that are less specific areas the small lesions did not enhance.   She received 2 days of IV Solu-Medrol and was discharged yesterday.  Update 06/11/2017: I first saw her 03/31/2017.   Was concerned about her word finding difficulties and an MRI of the brain was performed. It showed a subacute left frontal stroke. There were a few other punctate T2/FLAIR hyperintense foci consistent with minimal  chronic microvascular ischemic change.   I was most concerned about an embolic etiology. The carotid ultrasound was essentially normal. The cardiac event monitor was essentially normal.  A TEE showed a normal ejection fraction of 60-65 percent.   There was no evidence of a patent foramen ovale or other right-to-left shunt. The study was felt to be normal.  She is on aspirin, metoprolol and Crestor.  Here to the previous visit, she feels her speech is not much  Better abd she still has some aphasia and feels she needs to concentrate more with her speech.  She feels her right arm is still clumsy.   She has noted she spells words  wrong a lot and her typing is slowed with more errors.   She still has some slurring. There are no new symptoms.l;  She also has some anxiety and BuSpar was started.   It helped some but may have contributed to dizziness and headaches so the dose was decreased to 5 mg twice a day.    Headaches are less frequent in general (she was having more at last visit).   She will get occasional sharp pains.    She used to smoke but none in 4 years (quit after dad had lung cancer). No OCP (had hysterectomy)  She has FH of strokes and MI's .    From 03/31/2017: I had the pleasure seeing you patient, Elnora Pulsifer, at [facility] for neurologic consultation regarding her numbness, word finding difficulties, balance issues and other neurologic symptoms.  She is a 37 yo woman who had an episode with chest pain and bilateral arm numbness on 01/27/2016 and was taken to the ED (workup was normal).  She who noted numbness in her jaw at night about 5 months ago when starting to fall asleep.  In August 2018, she had another episode of chest tightness and arm numbness and went to an ED.   Since then, she has noted more symptoms such as slurred speech, verbal fluency issues, leg weakness and balance issues.    These symptoms mostly come and go for 10 - 30 minutes and now occur daily.    They occur randomly and in any position (sitting, standing, walking).    She also notes vision is blurry and she has episodes of worsening blurry vision, also lasting 10-30 minutes.   She has vertigo that is mild but constant.   It intensifies for a few minutes at a time sometimes while she is walking.      She has had panic attacks since 2016 after her brother was in a serious accident but she feels current symptoms are different.  She takes valium prn, just a few a month.  Many years ago, she was on Celexa.   She continues to note episodes of palpitations and tachycardia.   Metoprolol was started with benefit.     I reviewed the laboratory results from 02/21/2017. B12 is low-normal at 289. TSH, CBC and CMP are normal. LDL  and triglycerides were slightly elevated.   01/25/2017 CTA of the chest and CXR were normal.   She had a 30 day event monitor that showed intermittent tachycardia by her reports.  An Echo ws reportedly normal.    REVIEW OF SYSTEMS: Constitutional: No fevers, chills, sweats, or change in appetite.   She has mild sleep onset insomnia Eyes: No visual changes, double vision, eye pain Ear, nose and throat: No hearing loss, ear pain, nasal congestion, sore throat Cardiovascular: No chest pain.  Has intermittent tachycardia.  Respiratory: No shortness of breath at rest or with exertion.   No wheezes GastrointestinaI: No nausea, vomiting, diarrhea, abdominal pain, fecal incontinence Genitourinary: No dysuria, urinary retention or frequency.  No nocturia. Musculoskeletal: No neck pain, back pain Integumentary: No rash, pruritus, skin lesions Neurological: as above Psychiatric: No depression at this time.  H/o panic attacks. Endocrine: No palpitations, diaphoresis, change in appetite, change in weigh or increased thirst Hematologic/Lymphatic: No anemia, purpura, petechiae. Allergic/Immunologic: has some seasonal allergies  ALLERGIES: Allergies  Allergen Reactions  . Darvocet  [Propoxyphene N-Acetaminophen] Hives  . Demerol Hives  . Adhesive [Tape] Rash and Other (See Comments)    Including  bandaids - takes skin off, peels    HOME MEDICATIONS:  Current Outpatient Medications:  .  diazepam (VALIUM) 10 MG tablet, Take 10 mg by mouth at bedtime as needed for anxiety. , Disp: , Rfl:  .  ibuprofen (ADVIL,MOTRIN) 200 MG tablet, Take 400 mg by mouth every 6 (six) hours as needed for headache or moderate pain., Disp: , Rfl:  .  levocetirizine (XYZAL) 5 MG tablet, Take 5 mg by mouth daily., Disp: , Rfl: 11 .  metoprolol tartrate (LOPRESSOR) 25 MG tablet, TAKE ONE TABLET BY MOUTH TWICE DAILY, Disp: 60 tablet, Rfl: 6 .  natalizumab (TYSABRI) 300 MG/15ML injection, Inject 300 mg into the vein every 28 (twenty-eight) days., Disp: , Rfl:  .  nitrofurantoin, macrocrystal-monohydrate, (MACROBID) 100 MG capsule, Take 1 capsule (100 mg total) by mouth 2 (two) times daily., Disp: 20 capsule, Rfl: 0 No current facility-administered medications for this visit.   Facility-Administered Medications Ordered in Other Visits:  .  gadopentetate dimeglumine (MAGNEVIST) injection 18 mL, 18 mL, Intravenous, Once PRN, Nicholas Trompeter A, MD .  gadopentetate dimeglumine (MAGNEVIST) injection 18 mL, 18 mL, Intravenous, Once PRN, Unice Vantassel, Pearletha Furl, MD  PAST MEDICAL HISTORY: Past Medical History:  Diagnosis Date  . Anxiety   . Cold   . Heart palpitations   . High cholesterol   . History of IBS   . Hypertension   . Multiple sclerosis (HCC)   . Stroke [facility])     PAST SURGICAL HISTORY: Past Surgical History:  Procedure Laterality Date  . ABDOMINAL HYSTERECTOMY    . BLADDER NECK SUSPENSION    . CHOLECYSTECTOMY    . COLONOSCOPY  01/23/2011   Procedure: COLONOSCOPY;  Surgeon: Malissa Hippo, MD;  Location: [facility];  Service: Endoscopy;  Laterality: N/A;  . LAPAROSCOPIC TUBAL LIGATION    . TEE WITHOUT CARDIOVERSION N/A 05/05/2017   Procedure: TRANSESOPHAGEAL ECHOCARDIOGRAM (TEE);   Surgeon: Laqueta Linden, MD;  Location: [facility];  Service: Cardiovascular;  Laterality: N/A;    FAMILY HISTORY: Family History  Problem Relation Age of Onset  . Diabetes Father   . Hypertension Father   . Colon polyps Father   . Lung cancer Father   . Hypertension Mother   . Coronary artery disease Mother   . COPD Mother     SOCIAL HISTORY:  Social History   Socioeconomic History  . Marital status: Married    Spouse name: Not on file  . Number of children: Not on file  . Years of education: Not on file  . Highest education level: Not on file  Occupational History  . Not on file  Social Needs  . Financial resource strain: Not on file  . Food insecurity:    Worry: Not on file    Inability: Not on file  . Transportation needs:    Medical: Not on file    Non-medical: Not on file  Tobacco Use  . Smoking status: Former Smoker    Packs/day: 0.50    Years: 15.00    Pack years: 7.50    Types: Cigarettes    Last attempt to quit: 09/16/2012    Years since quitting: 5.4  . Smokeless tobacco: Never Used  Substance and Sexual Activity  . Alcohol use: Yes    Alcohol/week: 1.0 standard drinks    Types: 1 Shots of liquor per week    Comment: every few months  . Drug use: No  . Sexual activity: Yes    Birth control/protection: Surgical  Lifestyle  . Physical activity:    Days per week: Not on file    Minutes per session: Not on file  . Stress: Not on file  Relationships  . Social connections:    Talks on phone: Not on file    Gets together: Not on file    Attends religious service: Not on file    Active member of club or organization: Not on file    Attends meetings of clubs or organizations: Not on file    Relationship status: Not on file  . Intimate partner violence:    Fear of current or ex partner: Not on file    Emotionally abused: Not on file    Physically abused: Not on file    Forced sexual activity: Not on file  Other Topics Concern  . Not on  file  Social History Narrative  . Not on file     PHYSICAL EXAM  Vitals:   03/08/18 1043  BP: 120/79  Pulse: 74  Resp: 16  Weight: 200 lb 8 oz (90.9 kg)  Height: 5\' 6"  (1.676 m)    Body mass index is 32.36 kg/m.   General: The patient is well-developed and well-nourished and in no acute distress.   She is tender over the occiput on the right.    Neurologic Exam  Mental status: The patient is alert and oriented x 3 at the time of the examination. The patient has apparent normal recent and remote memory, with an apparently normal attention span and concentration ability.  Speech is slowed and mildly slurred but she is able to name objects without difficulty couple appear phrasing errors during our conversation. There is a general slowness of her thought process.     Cranial nerves: Visual acuity is 20/25 OS and 20/30 OD.  Color vision is symmetric.  Extraocular movements are full.  Facial strength and sensation was normal.  Trapezius strength was normal.  The tongue is midline, and the patient has symmetric elevation of the soft palate. No obvious hearing deficits are noted.  Motor:  Muscle bulk is normal.   Tone is normal. Strength is 5/5  Sensory: Intact sensation to touch and vibration in the arms and legs.   Coordination: Cerebellar testing reveals good finger-nose-finger and heel-to-shin bilaterally.  Gait and station: Station is normal.  The gait is minimally wide and she easily walks without support.  Tandem gait is mildly wide.  Romberg is negative.   Reflexes: Deep tendon reflexes are symmetric and 3 bilaterally in arms and knees and 2 at the ankles.  .   Plantar responses are flexor.    DIAGNOSTIC DATA (LABS, IMAGING, TESTING) - I reviewed patient records, labs, notes, testing and imaging myself where available.  Lab Results  Component Value Date   WBC 11.6 (H) 03/03/2018   HGB 12.7 03/03/2018   HCT 36.6 03/03/2018   MCV 86.7 03/03/2018   PLT 229 03/03/2018        Component Value Date/Time   NA 136 03/03/2018 1736   K 3.4 (L) 03/03/2018 1736   CL 105 03/03/2018 1736   CO2 23 03/03/2018 1736   GLUCOSE 85 03/03/2018 1736   BUN 12 03/03/2018 1736   CREATININE 0.70 03/03/2018 1740   CALCIUM 8.8 (L) 03/03/2018 1736   PROT 7.9 03/03/2018 1736   ALBUMIN 4.7 03/03/2018 1736   ALBUMIN 4.6 07/07/2017 0925   AST 19 03/03/2018 1736   ALT 14 03/03/2018 1736   ALKPHOS 71 03/03/2018 1736  BILITOT 0.9 03/03/2018 1736   GFRNONAA >60 03/03/2018 1736   GFRAA >60 03/03/2018 1736       ASSESSMENT AND PLAN  Multiple sclerosis (HCC)  Vision disturbance  Gait disturbance  Fluency disorder associated with underlying disease   1.    Continue Tysabri.   She is JCV Ab negative 2.    TPI of right splenius capitis muscle with 80 mg Depo-Medrol in [place],  She tolerated the injection well and there were no complications.    Pain improved.   3.   Will return to see me in 3-4 months or sooner if there are new or worsening neurologic symptoms.  Deandrae Wajda A. Epimenio Foot, MD, [facility] 03/08/2018, 11:29 AM Certified in Neurology, Clinical Neurophysiology, Sleep Medicine, Pain Medicine and Neuroimaging  [facility] [address], [address] [place], [place] [address] [phone]

## 2018-03-22 ENCOUNTER — Ambulatory Visit (INDEPENDENT_AMBULATORY_CARE_PROVIDER_SITE_OTHER): Payer: BLUE CROSS/BLUE SHIELD | Admitting: Otolaryngology

## 2018-03-22 DIAGNOSIS — J310 Chronic rhinitis: Secondary | ICD-10-CM

## 2018-03-22 DIAGNOSIS — R1312 Dysphagia, oropharyngeal phase: Secondary | ICD-10-CM

## 2018-03-22 DIAGNOSIS — R51 Headache: Secondary | ICD-10-CM

## 2018-03-23 DIAGNOSIS — G35 Multiple sclerosis: Secondary | ICD-10-CM | POA: Diagnosis not present

## 2018-03-23 DIAGNOSIS — R9089 Other abnormal findings on diagnostic imaging of central nervous system: Secondary | ICD-10-CM | POA: Diagnosis not present

## 2018-04-14 ENCOUNTER — Telehealth: Payer: Self-pay | Admitting: Neurology

## 2018-04-14 NOTE — Telephone Encounter (Signed)
Patient called back. I relayed information below. She had f/u on 04/22/18 and she will have JCV done then. Nothing further needed.

## 2018-04-14 NOTE — Telephone Encounter (Signed)
Pt states when she was here last she was told she would be scheduled for blood work every 6 months and a MRI.  Pt states she has not been contacted by anyone for an MRI and if these are things she needs to have done she wants to be scheduled

## 2018-04-14 NOTE — Telephone Encounter (Signed)
Called, LVM for pt returning her call.   She saw Dr. Epimenio Foot in the office on 03/08/18. She had labs and MRI completed on 03/03/18

## 2018-04-22 ENCOUNTER — Encounter: Payer: Self-pay | Admitting: Neurology

## 2018-04-22 ENCOUNTER — Other Ambulatory Visit: Payer: Self-pay

## 2018-04-22 ENCOUNTER — Ambulatory Visit: Payer: BLUE CROSS/BLUE SHIELD | Admitting: Neurology

## 2018-04-22 ENCOUNTER — Telehealth: Payer: Self-pay | Admitting: *Deleted

## 2018-04-22 VITALS — BP 105/60 | HR 76 | Ht 66.0 in | Wt 199.0 lb

## 2018-04-22 DIAGNOSIS — R4781 Slurred speech: Secondary | ICD-10-CM | POA: Diagnosis not present

## 2018-04-22 DIAGNOSIS — R200 Anesthesia of skin: Secondary | ICD-10-CM | POA: Diagnosis not present

## 2018-04-22 DIAGNOSIS — R269 Unspecified abnormalities of gait and mobility: Secondary | ICD-10-CM

## 2018-04-22 DIAGNOSIS — R42 Dizziness and giddiness: Secondary | ICD-10-CM | POA: Diagnosis not present

## 2018-04-22 DIAGNOSIS — G35 Multiple sclerosis: Secondary | ICD-10-CM | POA: Diagnosis not present

## 2018-04-22 NOTE — Telephone Encounter (Signed)
Placed JCV lab in [facility] lock box for routine lab pick up.  

## 2018-04-22 NOTE — Progress Notes (Signed)
[facility]  PATIENT: Lindsey Phelps DOB: [birth year]  REFERRING DOCTOR OR PCP:  Elfredia Nevins SOURCE: Patient, notes from Dr. Sherwood Gambler, laboratory results.  _________________________________   HISTORICAL  CHIEF COMPLAINT:  Chief Complaint  Patient presents with  . Follow-up    RM 12, alone. Last seen 03/08/18.   . Multiple Sclerosis    On Tysabri, doing well. Pt requesting JCV lab. Next infusion: tomorrow (04/23/18).    HISTORY OF PRESENT ILLNESS:  Lindsey Phelps is a 37 y.o. woman with MS with tumefactive lesions.  Update 04/22/2018: She feels her MS has been mostly stable.  She is on Tysabri.  She tolerates it well.  Her next infusion will be tomorrow.  The last JCV antibody titer was 0.17 (-) on 11/20/2017.  Her last MRI was 03/03/2018 and showed a stable number of lesions.  Some were reduced in size.  None of them enhanced.  Gait is doing well.   She gets rare jerks while walking but these have not worsened.   No new numbness or weakness.    Slurred speech resolved (occasioanl mild if tired).   She has urinary urgency and rare urge incontinence.   She has occasioanl word finding difficulty, also much better.  Neck pain and headaches improved after the splenius capitus TPI.   HA also better after getting her new glasses.      She took care of a cat with rabies but did not get bit (other family did and got treated -- no one had clinical rabies).  That was early September.     Update 03/08/2018: She is on Tysabri and tolerates it well.    The last JCV Ab titer was 0.17 negative on 11/20/2017.    She reports constant dizziness with eye pain and blurriness, R > left, for the past 8 days.      She also has had right occipital tenderness x 6-8 weeks sometimes feeling like an icepick. Last Wednesday, she was in bed and suddenly in the right leg and she had pins/needles sensation in the right hand.   She also noted worse visual disturbance bilaterally.     Sometimes  swallowing seems difficult.    She gets a lot of nasal congestion.    MRI does not show sinusitis.     She was taking care of a cat with rabies and family got the rabies shots but she did not.    She was not scratched or bitten.   She last touched the cat 01/31/2018.     She went to the ED 03/03/2018 and an MRI was performed.   I personally reviewed the images.  There were no new lesions and the MRI is essentially unchanged compared to 11/10/2017 and improved compared to earlier ones..     IMPRESSION: 1. No acute intracranial process. 2. Stable number and stable to decreased size of supratentorial white matter lesions most compatible with chronic demyelination (tumefactive component). No enhancement. No parenchymal brain volume loss for age.  UPDATE 11/20/2017: She tolerated the first two infusions well but has noted pressure in her sinuses and altered since the thirs infusion.    She is also reporting more pain in her legs and muscle cramps the last month.    She also has a stabbing pain in her chest similar to how she felt with pneumonia.   She continues to note word finding errors and it seems to be more fluctuating.     She had a 40 minute  episode of palpitations/tachycardia and increased anxiety a few weeks ago.    She still notes some numbness in her mouth but it is different.    IMPRESSION: This MRI of the brain with and without contrast shows the following: 1.    Multiple T2/flair hyperintense foci in the juxtacortical and periventricular white matter in a pattern and configuration consistent with chronic demyelinating plaque associated with multiple sclerosis.  When compared to the prior study 07/28/2017, lesions that enhanced on the previous study no longer do so.  There are no new lesions.  The previous MRI showed severe edema associated with most of the foci,  consistent with tumefactive MS. 2.    There is a normal enhancement pattern and there are no acute findings.  Update  08/06/2017: Since the last visit, her cerebrospinal fluid was borderline with 3 oligoclonal bands. MRI of the spinal cord did not show any additional lesions. There was no evidence of tumor on chest/abdomen/pelvic CT scan.     Additionally  she saw Dr. Venetia Maxon in neurosurgery. He also ran her case by neuroradiology and neuro-oncology. After the steroids, there was an improved appearance on the MRI. There were no new lesions.   I discussed with body that the likelihood that she has tumefactive MS is probably higher than the likelihood of a CNS lymphoma or other disorder.   Therefore an option would be to hold off on a brain biopsy and do several months treatment and then reimage. On re-imaging, if there is worsening, we would need to reconsider a brain biopsy.  Clinically, her speech is better.  Balance is better.    She notes positional numbness in her hands, worse with tasks like holding a phone.  Sometimes she wakes up with the numbness.    It then improves.      On Buspar, anxiety improved but she got headaches.    On 5 mg po bid, she is still getting headache but they are tolerable and her anxiety is still better.    Update 07/09/2017: She woke up Friday morning with more aphasia and dysphagia.   She has had some difficulty with her gait but it was no worse over the last month.   She also has had more trouble with cognition and processing speed and executive functioning over the past month.    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    She has noted mild visual changes over time but notes her glasses are old and no asymmetry or color vision changes.   She denies any bladder issues.    She feels much more tired over the past 3 months.     She sleeps ok if she takes valium at night.     She was admitted to [facility] last week when she presented with worsening gait and worsening aphasia. An MRI was performed. Besides the left frontal lesion noted on the previous MRI, she had 4 additional  large edema tenderness lesions that enhanced, one in the left frontal lobe, 2 in the right frontal lobe and another in the right temporal lobe. He also has a couple of punctate T2/FLAIR hyperintense foci that are less specific areas the small lesions did not enhance.   She received 2 days of IV Solu-Medrol and was discharged yesterday.  Update 06/11/2017: I first saw her 03/31/2017.   Was concerned about her word finding difficulties and an MRI of the brain was performed. It showed a subacute left frontal stroke. There were  a few other punctate T2/FLAIR hyperintense foci consistent with minimal chronic microvascular ischemic change.   I was most concerned about an embolic etiology. The carotid ultrasound was essentially normal. The cardiac event monitor was essentially normal.  A TEE showed a normal ejection fraction of 60-65 percent.   There was no evidence of a patent foramen ovale or other right-to-left shunt. The study was felt to be normal.  She is on aspirin, metoprolol and Crestor.  Here to the previous visit, she feels her speech is not much  Better abd she still has some aphasia and feels she needs to concentrate more with her speech.  She feels her right arm is still clumsy.   She has noted she spells words wrong a lot and her typing is slowed with more errors.   She still has some slurring. There are no new symptoms.l;  She also has some anxiety and BuSpar was started.   It helped some but may have contributed to dizziness and headaches so the dose was decreased to 5 mg twice a day.    Headaches are less frequent in general (she was having more at last visit).   She will get occasional sharp pains.    She used to smoke but none in 4 years (quit after dad had lung cancer). No OCP (had hysterectomy)  She has FH of strokes and MI's .    From 03/31/2017: I had the pleasure seeing you patient, Lindsey Phelps, at [facility] for neurologic consultation regarding her numbness, word  finding difficulties, balance issues and other neurologic symptoms.  She is a 37 yo woman who had an episode with chest pain and bilateral arm numbness on 01/27/2016 and was taken to the ED (workup was normal).  She who noted numbness in her jaw at night about 5 months ago when starting to fall asleep.  In August 2018, she had another episode of chest tightness and arm numbness and went to an ED.   Since then, she has noted more symptoms such as slurred speech, verbal fluency issues, leg weakness and balance issues.    These symptoms mostly come and go for 10 - 30 minutes and now occur daily.   They occur randomly and in any position (sitting, standing, walking).    She also notes vision is blurry and she has episodes of worsening blurry vision, also lasting 10-30 minutes.   She has vertigo that is mild but constant.   It intensifies for a few minutes at a time sometimes while she is walking.      She has had panic attacks since 2016 after her brother was in a serious accident but she feels current symptoms are different.  She takes valium prn, just a few a month.  Many years ago, she was on Celexa.   She continues to note episodes of palpitations and tachycardia.   Metoprolol was started with benefit.     I reviewed the laboratory results from 02/21/2017. B12 is low-normal at 289. TSH, CBC and CMP are normal. LDL  and triglycerides were slightly elevated.   01/25/2017 CTA of the chest and CXR were normal.   She had a 30 day event monitor that showed intermittent tachycardia by her reports.  An Echo ws reportedly normal.    REVIEW OF SYSTEMS: Constitutional: No fevers, chills, sweats, or change in appetite.   She has mild sleep onset insomnia Eyes: No visual changes, double vision, eye pain Ear, nose and throat: No hearing loss,  ear pain, nasal congestion, sore throat Cardiovascular: No chest pain.  Has intermittent tachycardia.  Respiratory: No shortness of breath at rest or with exertion.   No  wheezes GastrointestinaI: No nausea, vomiting, diarrhea, abdominal pain, fecal incontinence Genitourinary: No dysuria, urinary retention or frequency.  No nocturia. Musculoskeletal: No neck pain, back pain Integumentary: No rash, pruritus, skin lesions Neurological: as above Psychiatric: No depression at this time.  H/o panic attacks. Endocrine: No palpitations, diaphoresis, change in appetite, change in weigh or increased thirst Hematologic/Lymphatic: No anemia, purpura, petechiae. Allergic/Immunologic: has some seasonal allergies  ALLERGIES: Allergies  Allergen Reactions  . Darvocet [Propoxyphene N-Acetaminophen] Hives  . Demerol Hives  . Adhesive [Tape] Rash and Other (See Comments)    Including bandaids - takes skin off, peels    HOME MEDICATIONS:  Current Outpatient Medications:  .  diazepam (VALIUM) 10 MG tablet, Take 10 mg by mouth at bedtime as needed for anxiety. , Disp: , Rfl:  .  ibuprofen (ADVIL,MOTRIN) 200 MG tablet, Take 400 mg by mouth every 6 (six) hours as needed for headache or moderate pain., Disp: , Rfl:  .  levocetirizine (XYZAL) 5 MG tablet, Take 5 mg by mouth daily., Disp: , Rfl: 11 .  natalizumab (TYSABRI) 300 MG/15ML injection, Inject 300 mg into the vein every 28 (twenty-eight) days., Disp: , Rfl:  .  nitrofurantoin, macrocrystal-monohydrate, (MACROBID) 100 MG capsule, Take 1 capsule (100 mg total) by mouth 2 (two) times daily., Disp: 20 capsule, Rfl: 0 .  metoprolol tartrate (LOPRESSOR) 25 MG tablet, TAKE ONE TABLET BY MOUTH TWICE DAILY (Patient not taking: Reported on 04/22/2018), Disp: 60 tablet, Rfl: 6 No current facility-administered medications for this visit.   Facility-Administered Medications Ordered in Other Visits:  .  gadopentetate dimeglumine (MAGNEVIST) injection 18 mL, 18 mL, Intravenous, Once PRN, ,  A, MD .  gadopentetate dimeglumine (MAGNEVIST) injection 18 mL, 18 mL, Intravenous, Once PRN, , Pearletha Furl, MD  PAST  MEDICAL HISTORY: Past Medical History:  Diagnosis Date  . Anxiety   . Cold   . Heart palpitations   . High cholesterol   . History of IBS   . Hypertension   . Multiple sclerosis (HCC)   . Stroke [facility])     PAST SURGICAL HISTORY: Past Surgical History:  Procedure Laterality Date  . ABDOMINAL HYSTERECTOMY    . BLADDER NECK SUSPENSION    . CHOLECYSTECTOMY    . COLONOSCOPY  01/23/2011   Procedure: COLONOSCOPY;  Surgeon: Malissa Hippo, MD;  Location: [facility];  Service: Endoscopy;  Laterality: N/A;  . LAPAROSCOPIC TUBAL LIGATION    . TEE WITHOUT CARDIOVERSION N/A 05/05/2017   Procedure: TRANSESOPHAGEAL ECHOCARDIOGRAM (TEE);  Surgeon: Laqueta Linden, MD;  Location: [facility];  Service: Cardiovascular;  Laterality: N/A;    FAMILY HISTORY: Family History  Problem Relation Age of Onset  . Diabetes Father   . Hypertension Father   . Colon polyps Father   . Lung cancer Father   . Hypertension Mother   . Coronary artery disease Mother   . COPD Mother     SOCIAL HISTORY:  Social History   Socioeconomic History  . Marital status: Married    Spouse name: Not on file  . Number of children: Not on file  . Years of education: Not on file  . Highest education level: Not on file  Occupational History  . Not on file  Social Needs  . Financial resource strain: Not on file  . Food insecurity:  Worry: Not on file    Inability: Not on file  . Transportation needs:    Medical: Not on file    Non-medical: Not on file  Tobacco Use  . Smoking status: Former Smoker    Packs/day: 0.50    Years: 15.00    Pack years: 7.50    Types: Cigarettes    Last attempt to quit: 09/16/2012    Years since quitting: 5.6  . Smokeless tobacco: Never Used  Substance and Sexual Activity  . Alcohol use: Yes    Alcohol/week: 1.0 standard drinks    Types: 1 Shots of liquor per week    Comment: every few months  . Drug use: No  . Sexual activity: Yes    Birth control/protection:  Surgical  Lifestyle  . Physical activity:    Days per week: Not on file    Minutes per session: Not on file  . Stress: Not on file  Relationships  . Social connections:    Talks on phone: Not on file    Gets together: Not on file    Attends religious service: Not on file    Active member of club or organization: Not on file    Attends meetings of clubs or organizations: Not on file    Relationship status: Not on file  . Intimate partner violence:    Fear of current or ex partner: Not on file    Emotionally abused: Not on file    Physically abused: Not on file    Forced sexual activity: Not on file  Other Topics Concern  . Not on file  Social History Narrative  . Not on file     PHYSICAL EXAM  Vitals:   04/22/18 0955  BP: 105/60  Pulse: 76  SpO2: 98%  Weight: 199 lb (90.3 kg)  Height: 5\' 6"  (1.676 m)    Body mass index is 32.12 kg/m.   General: The patient is well-developed and well-nourished and in no acute distress.   Has only mild right occipital tenderness now, much better than last visit. Neurologic Exam  Mental status: The patient is alert and oriented x 3 at the time of the examination. The patient has apparent normal recent and remote memory, with an apparently normal attention span and concentration ability.  Speech is near normal today.   There is a general slowness of her thought process.     Cranial nerves:   Color vision is symmetric.  Extraocular movements are full.  Facial strength and sensation was normal.  Trapezius strength was normal.  The tongue is midline, and the patient has symmetric elevation of the soft palate. No obvious hearing deficits are noted.  Motor:  Muscle bulk is normal.   Tone is normal. Strength is 5/5  Sensory: Intact sensation to touch and vibration in the arms and legs.   Coordination: Cerebellar testing reveals good finger-nose-finger and heel-to-shin bilaterally.  Gait and station: Station is normal.  The gait is near  normal.  Tandem gait is mildly wide.  Romberg is negative.   Reflexes: Deep tendon reflexes are symmetric and slightly increased in the arms, increased at the knees with crossed adductors and normal at the ankles..   Plantar responses are flexor.    DIAGNOSTIC DATA (LABS, IMAGING, TESTING) - I reviewed patient records, labs, notes, testing and imaging myself where available.  Lab Results  Component Value Date   WBC 11.6 (H) 03/03/2018   HGB 12.7 03/03/2018   HCT 36.6 03/03/2018  MCV 86.7 03/03/2018   PLT 229 03/03/2018      Component Value Date/Time   NA 136 03/03/2018 1736   K 3.4 (L) 03/03/2018 1736   CL 105 03/03/2018 1736   CO2 23 03/03/2018 1736   GLUCOSE 85 03/03/2018 1736   BUN 12 03/03/2018 1736   CREATININE 0.70 03/03/2018 1740   CALCIUM 8.8 (L) 03/03/2018 1736   PROT 7.9 03/03/2018 1736   ALBUMIN 4.7 03/03/2018 1736   ALBUMIN 4.6 07/07/2017 0925   AST 19 03/03/2018 1736   ALT 14 03/03/2018 1736   ALKPHOS 71 03/03/2018 1736   BILITOT 0.9 03/03/2018 1736   GFRNONAA >60 03/03/2018 1736   GFRAA >60 03/03/2018 1736       ASSESSMENT AND PLAN  Multiple sclerosis (HCC)  Numbness  Vertigo  Slurred speech  Gait disturbance   1.    Continue Tysabri 300 mg every 4 weeks.  Her next infusion will be tomorrow.  We will check the CBC and JCV antibody to determine what her status is.  We discussed that if she converts from negative to positive we will need to consider a different disease modifying therapy. 2.    Stay active and exercise as tolerated. 3.   Return to see me in 5 to 6 months or sooner if there are new or worsening neurologic symptoms.   A. Epimenio Foot, MD, PhD, FAAN Certified in Neurology, Clinical Neurophysiology, Sleep Medicine, Pain Medicine and Neuroimaging Director, [facility] at [facility]  [facility] [address], [address] [place], [place] [address] [phone]

## 2018-04-23 DIAGNOSIS — R9089 Other abnormal findings on diagnostic imaging of central nervous system: Secondary | ICD-10-CM | POA: Diagnosis not present

## 2018-04-23 DIAGNOSIS — G35 Multiple sclerosis: Secondary | ICD-10-CM | POA: Diagnosis not present

## 2018-04-23 LAB — CBC WITH DIFFERENTIAL/PLATELET
BASOS: 1 %
Basophils Absolute: 0.1 10*3/uL (ref 0.0–0.2)
EOS (ABSOLUTE): 0.7 10*3/uL — AB (ref 0.0–0.4)
Eos: 8 %
HEMATOCRIT: 35.7 % (ref 34.0–46.6)
Hemoglobin: 12.1 g/dL (ref 11.1–15.9)
IMMATURE GRANS (ABS): 0.1 10*3/uL (ref 0.0–0.1)
IMMATURE GRANULOCYTES: 1 %
LYMPHS: 51 %
Lymphocytes Absolute: 4.7 10*3/uL — ABNORMAL HIGH (ref 0.7–3.1)
MCH: 30.1 pg (ref 26.6–33.0)
MCHC: 33.9 g/dL (ref 31.5–35.7)
MCV: 89 fL (ref 79–97)
MONOCYTES: 7 %
Monocytes Absolute: 0.6 10*3/uL (ref 0.1–0.9)
NEUTROS PCT: 32 %
NRBC: 1 % — AB (ref 0–0)
Neutrophils Absolute: 3 10*3/uL (ref 1.4–7.0)
PLATELETS: 232 10*3/uL (ref 150–450)
RBC: 4.02 x10E6/uL (ref 3.77–5.28)
RDW: 14.4 % (ref 12.3–15.4)
WBC: 9.1 10*3/uL (ref 3.4–10.8)

## 2018-05-04 ENCOUNTER — Telehealth: Payer: Self-pay | Admitting: Neurology

## 2018-05-04 MED ORDER — INDOMETHACIN 25 MG PO CAPS: ORAL_CAPSULE | ORAL | 0 refills | Status: DC

## 2018-05-04 NOTE — Telephone Encounter (Signed)
Called pt back. She states the month before she saw Dr. Epimenio Foot she was feeling ok. She had her Tysabri infusion on 04/23/2018. Ever since, she has felt more tired. This is not uncommon when she has an infusion. This episode is worse than her normal. This past Sunday night into Monday morning she has had a headache. She denies congestion. She called out of work today d/t pain. She is having shooting pain behind right eye and above her ear. Having a lot of pressure from the top of her head down to her neck. If she turns her head quick it hurts. She took xyzal a couple days ago to see if would help but ineffective. Tried ibuprofen but ineffective.  Back in October Dr. Epimenio Foot did nerve block that was beneficial. Wondering if she can come back in for this or if Dr. Epimenio Foot recommends something else. Advised I will speak with him and call back to let her know. She verbalized understanding.

## 2018-05-04 NOTE — Telephone Encounter (Addendum)
Spoke with Dr. Epimenio Foot- he would like to have her try indomethacin 25mg . Directions: 1 cap po TID w/ food prn. #12, 0 refills.

## 2018-05-04 NOTE — Telephone Encounter (Signed)
Pt requesting a call stating that after her last infusion she is currently feeling like she is "dragginhg around"  intense pressure throughout her head. Unsure if Dr. Epimenio Foot would like her to come in for another infusion or if she should wait a little longer. Requesting a call to advise.

## 2018-05-04 NOTE — Telephone Encounter (Signed)
JCV negative, titer: 0.16. Drawn on 04/22/18

## 2018-05-04 NOTE — Telephone Encounter (Signed)
I called pt and relayed recommendation. She is agreeable to plan. I went over SE. She will call if she has further questions/concerns.

## 2018-05-12 ENCOUNTER — Telehealth: Payer: Self-pay | Admitting: Neurology

## 2018-05-12 NOTE — Telephone Encounter (Signed)
Patient states she had her windows in car darkened because of her headaches. She needs a note stating this in case she gets pulled over by a policeman. Please call when ready for pickup.

## 2018-05-12 NOTE — Telephone Encounter (Signed)
I have spoken with pt.  She says she has her car windows tinted 5% darker that is legally allowed, and needs a letter for the dmv stating the reason for the darker tint is that she has migraines. I have explained that this is a legal issue; we can't override legal authority. Our office does not provide this type of note. She verbalized understanding of same, sts. she will check with her pcp/fim

## 2018-05-18 DIAGNOSIS — G35 Multiple sclerosis: Secondary | ICD-10-CM | POA: Diagnosis not present

## 2018-05-18 DIAGNOSIS — Z1389 Encounter for screening for other disorder: Secondary | ICD-10-CM | POA: Diagnosis not present

## 2018-05-21 DIAGNOSIS — G35 Multiple sclerosis: Secondary | ICD-10-CM | POA: Diagnosis not present

## 2018-05-21 DIAGNOSIS — R9089 Other abnormal findings on diagnostic imaging of central nervous system: Secondary | ICD-10-CM | POA: Diagnosis not present

## 2018-06-18 DIAGNOSIS — Z6832 Body mass index (BMI) 32.0-32.9, adult: Secondary | ICD-10-CM | POA: Diagnosis not present

## 2018-06-18 DIAGNOSIS — Z1389 Encounter for screening for other disorder: Secondary | ICD-10-CM | POA: Diagnosis not present

## 2018-06-18 DIAGNOSIS — J3089 Other allergic rhinitis: Secondary | ICD-10-CM | POA: Diagnosis not present

## 2018-06-18 DIAGNOSIS — R609 Edema, unspecified: Secondary | ICD-10-CM | POA: Diagnosis not present

## 2018-06-18 DIAGNOSIS — R9089 Other abnormal findings on diagnostic imaging of central nervous system: Secondary | ICD-10-CM | POA: Diagnosis not present

## 2018-06-18 DIAGNOSIS — E6609 Other obesity due to excess calories: Secondary | ICD-10-CM | POA: Diagnosis not present

## 2018-06-18 DIAGNOSIS — G35 Multiple sclerosis: Secondary | ICD-10-CM | POA: Diagnosis not present

## 2018-07-16 DIAGNOSIS — R9089 Other abnormal findings on diagnostic imaging of central nervous system: Secondary | ICD-10-CM | POA: Diagnosis not present

## 2018-07-16 DIAGNOSIS — G35 Multiple sclerosis: Secondary | ICD-10-CM | POA: Diagnosis not present

## 2018-08-03 ENCOUNTER — Telehealth: Payer: Self-pay | Admitting: Neurology

## 2018-08-03 ENCOUNTER — Telehealth: Payer: Self-pay | Admitting: *Deleted

## 2018-08-03 NOTE — Telephone Encounter (Signed)
Received fax from [facility] touch prescribing program that pt re-authorized from 08/03/18-02/11/19. Pt enrollment#: [id number]. Account: [employer]. Site auth#: [id number].

## 2018-08-09 NOTE — Telephone Encounter (Signed)
error 

## 2018-08-10 DIAGNOSIS — Z79899 Other long term (current) drug therapy: Secondary | ICD-10-CM | POA: Diagnosis not present

## 2018-08-13 ENCOUNTER — Telehealth: Payer: Self-pay | Admitting: Neurology

## 2018-08-13 DIAGNOSIS — G35 Multiple sclerosis: Secondary | ICD-10-CM | POA: Diagnosis not present

## 2018-08-13 DIAGNOSIS — R9089 Other abnormal findings on diagnostic imaging of central nervous system: Secondary | ICD-10-CM | POA: Diagnosis not present

## 2018-08-13 NOTE — Telephone Encounter (Signed)
Pt request to be transferred to infusions

## 2018-08-27 ENCOUNTER — Telehealth: Payer: Self-pay | Admitting: Neurology

## 2018-08-27 NOTE — Telephone Encounter (Signed)
pt has called for the intrafusion suite, call transferred °

## 2018-09-09 DIAGNOSIS — G35 Multiple sclerosis: Secondary | ICD-10-CM | POA: Diagnosis not present

## 2018-09-09 DIAGNOSIS — R9089 Other abnormal findings on diagnostic imaging of central nervous system: Secondary | ICD-10-CM | POA: Diagnosis not present

## 2018-09-17 ENCOUNTER — Telehealth: Payer: Self-pay | Admitting: Neurology

## 2018-09-17 NOTE — Telephone Encounter (Signed)
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

## 2018-09-20 NOTE — Telephone Encounter (Addendum)
I called pt back. She will contact [employer] to fax FMLA forms to [place] to fill out to write her out of work. I provided her fax: [phone]. 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 They are asking them to clean bathrooms.   She states she gets a sharp pain on right side of head for an hour after she gets Tysabri infusions. Then for 3-4 days after infusion, she does not feel well and stays in bed. Her vision has worsened and has been to eye doctor who states exam okay. She would like to discuss different treatment options at next f/u 10/2018. I offered to schedule sooner virtual visit but she declined at this time. She will call back in the next week if sx worsens and wants to do this.

## 2018-09-20 NOTE — Telephone Encounter (Signed)
Dr. Sater- what would you recommend? 

## 2018-09-20 NOTE — Telephone Encounter (Signed)
1.   She is on Tysabri.  Tysabri probably does not add much risk for COVID-19.  However, it would still be best for her not to be in contact with multiple people a day.  Therefore, I can write her out of work.  We would need to know where to send the note. 2.   If the headaches and dizziness worsen, we can do a video visit.

## 2018-09-30 DIAGNOSIS — Z0289 Encounter for other administrative examinations: Secondary | ICD-10-CM

## 2018-09-30 NOTE — Telephone Encounter (Signed)
Gave completed/signed FMLA back to medical records to process for patient. 

## 2018-10-07 DIAGNOSIS — R9089 Other abnormal findings on diagnostic imaging of central nervous system: Secondary | ICD-10-CM | POA: Diagnosis not present

## 2018-10-07 DIAGNOSIS — G35 Multiple sclerosis: Secondary | ICD-10-CM | POA: Diagnosis not present

## 2018-10-19 ENCOUNTER — Telehealth: Payer: Self-pay | Admitting: *Deleted

## 2018-10-19 NOTE — Telephone Encounter (Signed)
Called pt. Updated medication list/pharmacy/allergies on file. She preferred in office visit since she needs labs/MRI as well. Asked that she wear a mask to appt. Explained new check in process. She denies signs/sx of covid-19 or being exposed to anyone with covid-19.   She was written out of work until 10/21/18. They will need updated notes/results to extend her leave until end of June.

## 2018-10-21 ENCOUNTER — Other Ambulatory Visit: Payer: Self-pay

## 2018-10-21 ENCOUNTER — Telehealth: Payer: Self-pay | Admitting: *Deleted

## 2018-10-21 ENCOUNTER — Ambulatory Visit: Payer: BLUE CROSS/BLUE SHIELD | Admitting: Neurology

## 2018-10-21 VITALS — BP 120/76 | HR 86 | Temp 97.7°F | Ht 66.0 in | Wt 190.0 lb

## 2018-10-21 DIAGNOSIS — M542 Cervicalgia: Secondary | ICD-10-CM

## 2018-10-21 DIAGNOSIS — R4781 Slurred speech: Secondary | ICD-10-CM | POA: Diagnosis not present

## 2018-10-21 DIAGNOSIS — G4489 Other headache syndrome: Secondary | ICD-10-CM

## 2018-10-21 DIAGNOSIS — G35 Multiple sclerosis: Secondary | ICD-10-CM

## 2018-10-21 DIAGNOSIS — Z79899 Other long term (current) drug therapy: Secondary | ICD-10-CM | POA: Diagnosis not present

## 2018-10-21 MED ORDER — IMIPRAMINE HCL 25 MG PO TABS: 25.0000 mg | ORAL_TABLET | Freq: Every day | ORAL | 5 refills | Status: DC

## 2018-10-21 NOTE — Telephone Encounter (Signed)
Placed JCV lab in [facility] lock box for routine lab pick up. Results pending. 

## 2018-10-21 NOTE — Progress Notes (Addendum)
[facility]  PATIENT: Lindsey Phelps DOB: 01/17/1981  REFERRING DOCTOR OR PCP:  Elfredia Nevins SOURCE: Patient, notes from Dr. Sherwood Gambler, laboratory results.  _________________________________   HISTORICAL  CHIEF COMPLAINT:  Chief Complaint  Patient presents with   Follow-up    RM 12. Last seen 04/22/18   Multiple Sclerosis    On tysabri. Last JCV     HISTORY OF PRESENT ILLNESS:  Lindsey Phelps is a 38 y.o. woman with MS with tumefactive lesions.  Update 10/21/2018: She is on Tysabri.  She initially tolerated it well but over the last 4 infusions she will experience more pain in the right occipital region and temple.    She also notes pressure in her right eye.   There is a squeezing sensation.   The last infusion was associated with pain starting during the infusion.   Last year, she just had headaches intermittently.   She started Tysabri in March, 2019.  She would like to stop Tysabri due to the side effects and not to go on any disease modifying therapy.  I had a conversation with her about the possibility that she has an aggressive MS due to the MRI changes that were occurring in late 2018 in early 2019 with notable tumefactive lesions.  She appears to have done well on Tysabri.  She was most interested in no therapy but will consider [name].  If the COVID endemic improves Mavenclad might be something that she would be more interested in due to the short course and long duration of efficacy.  I gave her information on both treatments.  Physically she is doing about the same.  Her gait is off balance.  Strength is doing ok.  She does not note major numbness.  Her slurred speech resolved for the most part.  There is some urinary frequency but no recent incontinence..  She also notes more cognitive fog and more mood irritability over the last several months as well.  She also has fatigue. \ She is reporting a headache starting in the right occiput and radiating  behind the right eye.  This is similar to headache in the past that responded to injections.  Her mom had optic neuritis about 20 years ago and some MRI changes initially felt to possibly be MS but she was never placed on any disease modifying therapy.  Update 04/22/2018: She feels her MS has been mostly stable.  She is on Tysabri.  She tolerates it well.  Her next infusion will be tomorrow.  The last JCV antibody titer was 0.17 (-) on 11/20/2017.  Her last MRI was 03/03/2018 and showed a stable number of lesions.  Some were reduced in size.  None of them enhanced.  Gait is doing well.   She gets rare jerks while walking but these have not worsened.   No new numbness or weakness.    Slurred speech resolved (occasioanl mild if tired).   She has urinary urgency and rare urge incontinence.   She has occasioanl word finding difficulty, also much better.  Neck pain and headaches improved after the splenius capitus TPI.   HA also better after getting her new glasses.      She took care of a cat with rabies but did not get bit (other family did and got treated -- no one had clinical rabies).  That was early September.     Update 03/08/2018: She is on Tysabri and tolerates it well.    The last JCV Ab  titer was 0.17 negative on 11/20/2017.    She reports constant dizziness with eye pain and blurriness, R > left, for the past 8 days.      She also has had right occipital tenderness x 6-8 weeks sometimes feeling like an icepick. Last Wednesday, she was in bed and suddenly in the right leg and she had pins/needles sensation in the right hand.   She also noted worse visual disturbance bilaterally.     Sometimes swallowing seems difficult.    She gets a lot of nasal congestion.    MRI does not show sinusitis.     She was taking care of a cat with rabies and family got the rabies shots but she did not.    She was not scratched or bitten.   She last touched the cat 01/31/2018.     She went to the ED 03/03/2018 and an  MRI was performed.   I personally reviewed the images.  There were no new lesions and the MRI is essentially unchanged compared to 11/10/2017 and improved compared to earlier ones..     IMPRESSION: 1. No acute intracranial process. 2. Stable number and stable to decreased size of supratentorial white matter lesions most compatible with chronic demyelination (tumefactive component). No enhancement. No parenchymal brain volume loss for age.  UPDATE 11/20/2017: She tolerated the first two infusions well but has noted pressure in her sinuses and altered since the thirs infusion.    She is also reporting more pain in her legs and muscle cramps the last month.    She also has a stabbing pain in her chest similar to how she felt with pneumonia.   She continues to note word finding errors and it seems to be more fluctuating.     She had a 40 minute episode of palpitations/tachycardia and increased anxiety a few weeks ago.    She still notes some numbness in her mouth but it is different.    IMPRESSION: This MRI of the brain with and without contrast shows the following: 1.    Multiple T2/flair hyperintense foci in the juxtacortical and periventricular white matter in a pattern and configuration consistent with chronic demyelinating plaque associated with multiple sclerosis.  When compared to the prior study 07/28/2017, lesions that enhanced on the previous study no longer do so.  There are no new lesions.  The previous MRI showed severe edema associated with most of the foci,  consistent with tumefactive MS. 2.    There is a normal enhancement pattern and there are no acute findings.  Update 08/06/2017: Since the last visit, her cerebrospinal fluid was borderline with 3 oligoclonal bands. MRI of the spinal cord did not show any additional lesions. There was no evidence of tumor on chest/abdomen/pelvic CT scan.     Additionally  she saw Dr. Venetia Maxon in neurosurgery. He also ran her case by neuroradiology and  neuro-oncology. After the steroids, there was an improved appearance on the MRI. There were no new lesions.   I discussed with body that the likelihood that she has tumefactive MS is probably higher than the likelihood of a CNS lymphoma or other disorder.   Therefore an option would be to hold off on a brain biopsy and do several months treatment and then reimage. On re-imaging, if there is worsening, we would need to reconsider a brain biopsy.  Clinically, her speech is better.  Balance is better.    She notes positional numbness in her hands, worse with tasks  like holding a phone.  Sometimes she wakes up with the numbness.    It then improves.      On Buspar, anxiety improved but she got headaches.    On 5 mg po bid, she is still getting headache but they are tolerable and her anxiety is still better.    Update 07/09/2017: She woke up Friday morning with more aphasia and dysphagia.   She has had some difficulty with her gait but it was no worse over the last month.   She also has had more trouble with cognition and processing speed and executive functioning over the past month.    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    She has noted mild visual changes over time but notes her glasses are old and no asymmetry or color vision changes.   She denies any bladder issues.    She feels much more tired over the past 3 months.     She sleeps ok if she takes valium at night.     She was [facility] to [facility] last week when she presented with worsening gait and worsening aphasia. An MRI was performed. Besides the left frontal lesion noted on the previous MRI, she had 4 additional large edema tenderness lesions that enhanced, one in the left frontal lobe, 2 in the right frontal lobe and another in the right temporal lobe. He also has a couple of punctate T2/FLAIR hyperintense foci that are less specific areas the small lesions did not enhance.   She received 2 days of IV Solu-Medrol and was  discharged yesterday.  Update 06/11/2017: I first saw her 03/31/2017.   Was concerned about her word finding difficulties and an MRI of the brain was performed. It showed a subacute left frontal stroke. There were a few other punctate T2/FLAIR hyperintense foci consistent with minimal chronic microvascular ischemic change.   I was most concerned about an embolic etiology. The carotid ultrasound was essentially normal. The cardiac event monitor was essentially normal.  A TEE showed a normal ejection fraction of 60-65 percent.   There was no evidence of a patent foramen ovale or other right-to-left shunt. The study was felt to be normal.  She is on aspirin, metoprolol and Crestor.  Here to the previous visit, she feels her speech is not much  Better abd she still has some aphasia and feels she needs to concentrate more with her speech.  She feels her right arm is still clumsy.   She has noted she spells words wrong a lot and her typing is slowed with more errors.   She still has some slurring. There are no new symptoms.l;  She also has some anxiety and BuSpar was started.   It helped some but may have contributed to dizziness and headaches so the dose was decreased to 5 mg twice a day.    Headaches are less frequent in general (she was having more at last visit).   She will get occasional sharp pains.    She used to smoke but none in 4 years (quit after dad had lung cancer). No OCP (had hysterectomy)  She has FH of strokes and MI's .    From 03/31/2017: I had the pleasure seeing you patient, Lindsey Phelps, at [facility] for neurologic consultation regarding her numbness, word finding difficulties, balance issues and other neurologic symptoms.  She is a 38 yo woman who had an episode with chest pain and bilateral arm numbness on  01/27/2016 and was taken to the ED (workup was normal).  She who noted numbness in her jaw at night about 5 months ago when starting to fall asleep.  In August  2018, she had another episode of chest tightness and arm numbness and went to an ED.   Since then, she has noted more symptoms such as slurred speech, verbal fluency issues, leg weakness and balance issues.    These symptoms mostly come and go for 10 - 30 minutes and now occur daily.   They occur randomly and in any position (sitting, standing, walking).    She also notes vision is blurry and she has episodes of worsening blurry vision, also lasting 10-30 minutes.   She has vertigo that is mild but constant.   It intensifies for a few minutes at a time sometimes while she is walking.      She has had panic attacks since 2016 after her brother was in a serious accident but she feels current symptoms are different.  She takes valium prn, just a few a month.  Many years ago, she was on Celexa.   She continues to note episodes of palpitations and tachycardia.   Metoprolol was started with benefit.     I reviewed the laboratory results from 02/21/2017. B12 is low-normal at 289. TSH, CBC and CMP are normal. LDL  and triglycerides were slightly elevated.   01/25/2017 CTA of the chest and CXR were normal.   She had a 30 day event monitor that showed intermittent tachycardia by her reports.  An Echo ws reportedly normal.    REVIEW OF SYSTEMS: Constitutional: No fevers, chills, sweats, or change in appetite.   She has mild sleep onset insomnia Eyes: No visual changes, double vision, eye pain Ear, nose and throat: No hearing loss, ear pain, nasal congestion, sore throat Cardiovascular: No chest pain.  Has intermittent tachycardia.  Respiratory: No shortness of breath at rest or with exertion.   No wheezes GastrointestinaI: No nausea, vomiting, diarrhea, abdominal pain, fecal incontinence Genitourinary: No dysuria, urinary retention or frequency.  No nocturia. Musculoskeletal: No neck pain, back pain Integumentary: No rash, pruritus, skin lesions Neurological: as above Psychiatric: No depression at this time.   H/o panic attacks. Endocrine: No palpitations, diaphoresis, change in appetite, change in weigh or increased thirst Hematologic/Lymphatic: No anemia, purpura, petechiae. Allergic/Immunologic: has some seasonal allergies  ALLERGIES: Allergies  Allergen Reactions   Darvocet [Propoxyphene N-Acetaminophen] Hives   Demerol Hives   Adhesive [Tape] Rash and Other (See Comments)    Including bandaids - takes skin off, peels    HOME MEDICATIONS:  Current Outpatient Medications:    cetirizine-pseudoephedrine (ZYRTEC-D) 5-120 MG tablet, Take 1 tablet by mouth 2 (two) times daily., Disp: , Rfl:    diazepam (VALIUM) 10 MG tablet, Take 10 mg by mouth at bedtime as needed for anxiety. , Disp: , Rfl:    ibuprofen (ADVIL) 200 MG tablet, Take 200 mg by mouth every 6 (six) hours as needed., Disp: , Rfl:    natalizumab (TYSABRI) 300 MG/15ML injection, Inject 300 mg into the vein every 28 (twenty-eight) days., Disp: , Rfl:  No current facility-administered medications for this visit.   Facility-Administered Medications Ordered in Other Visits:    gadopentetate dimeglumine (MAGNEVIST) injection 18 mL, 18 mL, Intravenous, Once PRN, Nastashia Gallo A, MD   gadopentetate dimeglumine (MAGNEVIST) injection 18 mL, 18 mL, Intravenous, Once PRN, [place] Rico, Pearletha Furl, MD  PAST MEDICAL HISTORY: Past Medical History:  Diagnosis Date  Anxiety    Cold    Heart palpitations    High cholesterol    History of IBS    Hypertension    Multiple sclerosis (HCC)    Stroke (HCC)     PAST SURGICAL HISTORY: Past Surgical History:  Procedure Laterality Date   ABDOMINAL HYSTERECTOMY     BLADDER NECK SUSPENSION     CHOLECYSTECTOMY     COLONOSCOPY  01/23/2011   Procedure: COLONOSCOPY;  Surgeon: Malissa Hippo, MD;  Location: [facility];  Service: Endoscopy;  Laterality: N/A;   LAPAROSCOPIC TUBAL LIGATION     TEE WITHOUT CARDIOVERSION N/A 05/05/2017   Procedure: TRANSESOPHAGEAL ECHOCARDIOGRAM  (TEE);  Surgeon: Laqueta Linden, MD;  Location: [facility];  Service: Cardiovascular;  Laterality: N/A;    FAMILY HISTORY: Family History  Problem Relation Age of Onset   Diabetes Father    Hypertension Father    Colon polyps Father    Lung cancer Father    Hypertension Mother    Coronary artery disease Mother    COPD Mother     SOCIAL HISTORY:  Social History   Socioeconomic History   Marital status: Married    Spouse name: Not on file   Number of children: Not on file   Years of education: Not on file   Highest education level: Not on file  Occupational History   Not on file  Social Needs   Financial resource strain: Not on file   Food insecurity:    Worry: Not on file    Inability: Not on file   Transportation needs:    Medical: Not on file    Non-medical: Not on file  Tobacco Use   Smoking status: Former Smoker    Packs/day: 0.50    Years: 15.00    Pack years: 7.50    Types: Cigarettes    Last attempt to quit: 09/16/2012    Years since quitting: 6.0   Smokeless tobacco: Never Used  Substance and Sexual Activity   Alcohol use: Yes    Alcohol/week: 1.0 standard drinks    Types: 1 Shots of liquor per week    Comment: every few months   Drug use: No   Sexual activity: Yes    Birth control/protection: Surgical  Lifestyle   Physical activity:    Days per week: Not on file    Minutes per session: Not on file   Stress: Not on file  Relationships   Social connections:    Talks on phone: Not on file    Gets together: Not on file    Attends religious service: Not on file    Active member of club or organization: Not on file    Attends meetings of clubs or organizations: Not on file    Relationship status: Not on file   Intimate partner violence:    Fear of current or ex partner: Not on file    Emotionally abused: Not on file    Physically abused: Not on file    Forced sexual activity: Not on file  Other Topics Concern    Not on file  Social History Narrative   Not on file     PHYSICAL EXAM  There were no vitals filed for this visit.  There is no height or weight on file to calculate BMI.   General: The patient is well-developed and well-nourished and in no acute distress.   Has only mild right occipital tenderness now, much better than last visit. Neurologic  Exam  Mental status: The patient is alert and oriented x 3 at the time of the examination. The patient has apparent normal recent and remote memory, with an apparently normal attention span and concentration ability.  Speech is near normal today.   There is a general slowness of her thought process.     Cranial nerves:   Color vision is symmetric.  Extraocular movements are full.  Facial strength and sensation was normal.  Trapezius strength was normal.  The tongue is midline, and the patient has symmetric elevation of the soft palate. No obvious hearing deficits are noted.  Motor:  Muscle bulk is normal.   Tone is normal. Strength is 5/5  Sensory: Intact sensation to touch and vibration in the arms and legs.   Coordination: Cerebellar testing reveals good finger-nose-finger and heel-to-shin bilaterally.  Gait and station: Station is normal.  The gait is near normal.  Tandem gait is mildly wide.  Romberg is negative.   Reflexes: Deep tendon reflexes are symmetric and slightly increased in the arms, increased at the knees with crossed adductors and normal at the ankles..   Plantar responses are flexor.    DIAGNOSTIC DATA (LABS, IMAGING, TESTING) - I reviewed patient records, labs, notes, testing and imaging myself where available.  Lab Results  Component Value Date   WBC 9.1 04/22/2018   HGB 12.1 04/22/2018   HCT 35.7 04/22/2018   MCV 89 04/22/2018   PLT 232 04/22/2018      Component Value Date/Time   NA 136 03/03/2018 1736   K 3.4 (L) 03/03/2018 1736   CL 105 03/03/2018 1736   CO2 23 03/03/2018 1736   GLUCOSE 85 03/03/2018 1736     BUN 12 03/03/2018 1736   CREATININE 0.70 03/03/2018 1740   CALCIUM 8.8 (L) 03/03/2018 1736   PROT 7.9 03/03/2018 1736   ALBUMIN 4.7 03/03/2018 1736   ALBUMIN 4.6 07/07/2017 0925   AST 19 03/03/2018 1736   ALT 14 03/03/2018 1736   ALKPHOS 71 03/03/2018 1736   BILITOT 0.9 03/03/2018 1736   GFRNONAA >60 03/03/2018 1736   GFRAA >60 03/03/2018 1736       ASSESSMENT AND PLAN  No diagnosis found.   1.    She wishes to stop Tysabri.  She was most interested in either [name] or Mavenclad.  We will check blood work for both.  If the COVID-19 pandemic improves Mavenclad might be the option that best fits her concern about long-term treatment.  However, [name] could be a safer choice if the virus is not much better over the next couple months  2.    Stay active and exercise as tolerated. 3.   Right splenius capitus injection with 80 mg Depo-Medrol and 3 cc Marcaine.  She tolerated the procedure well and headache was much better afterwards. 4.   Anticipate return to work 12/01/2018.  Return to see me in 5 to 6 months or sooner if there are new or worsening neurologic symptoms.  Lindsey Phelps A. Epimenio Foot, MD, PhD, FAAN Certified in Neurology, Clinical Neurophysiology, Sleep Medicine, Pain Medicine and Neuroimaging Director, Multiple S[facility] Center at [facility]  [facility] [address], [address] [place], [place] [address] [phone]

## 2018-10-22 ENCOUNTER — Encounter: Payer: Self-pay | Admitting: Neurology

## 2018-10-22 DIAGNOSIS — G4489 Other headache syndrome: Secondary | ICD-10-CM | POA: Insufficient documentation

## 2018-10-24 LAB — CBC WITH DIFFERENTIAL/PLATELET
Basophils Absolute: 0.1 10*3/uL (ref 0.0–0.2)
Basos: 1 %
EOS (ABSOLUTE): 0.8 10*3/uL — ABNORMAL HIGH (ref 0.0–0.4)
Eos: 10 %
Hematocrit: 38.4 % (ref 34.0–46.6)
Hemoglobin: 13.3 g/dL (ref 11.1–15.9)
Immature Grans (Abs): 0 10*3/uL (ref 0.0–0.1)
Immature Granulocytes: 0 %
Lymphocytes Absolute: 4.6 10*3/uL — ABNORMAL HIGH (ref 0.7–3.1)
Lymphs: 52 %
MCH: 30.1 pg (ref 26.6–33.0)
MCHC: 34.6 g/dL (ref 31.5–35.7)
MCV: 87 fL (ref 79–97)
Monocytes Absolute: 0.5 10*3/uL (ref 0.1–0.9)
Monocytes: 6 %
NRBC: 1 % — ABNORMAL HIGH (ref 0–0)
Neutrophils Absolute: 2.7 10*3/uL (ref 1.4–7.0)
Neutrophils: 31 %
Platelets: 255 10*3/uL (ref 150–450)
RBC: 4.42 x10E6/uL (ref 3.77–5.28)
RDW: 13.4 % (ref 11.7–15.4)
WBC: 8.7 10*3/uL (ref 3.4–10.8)

## 2018-10-24 LAB — HEPATIC FUNCTION PANEL
ALT: 15 IU/L (ref 0–32)
AST: 15 IU/L (ref 0–40)
Albumin: 4.7 g/dL (ref 3.8–4.8)
Alkaline Phosphatase: 71 IU/L (ref 39–117)
Bilirubin Total: 0.5 mg/dL (ref 0.0–1.2)
Bilirubin, Direct: 0.13 mg/dL (ref 0.00–0.40)
Total Protein: 6.9 g/dL (ref 6.0–8.5)

## 2018-10-24 LAB — QUANTIFERON-TB GOLD PLUS
QuantiFERON Mitogen Value: >10 IU/mL
QuantiFERON Nil Value: 0.03 IU/mL
QuantiFERON TB1 Ag Value: 0.03 IU/mL
QuantiFERON TB2 Ag Value: 0.03 IU/mL
QuantiFERON-TB Gold Plus: NEGATIVE

## 2018-10-24 LAB — HEPATITIS B SURFACE ANTIGEN: Hepatitis B Surface Ag: NEGATIVE

## 2018-10-24 LAB — HEPATITIS B CORE ANTIBODY, TOTAL: Hep B Core Total Ab: NEGATIVE

## 2018-10-24 LAB — HEPATITIS B SURFACE ANTIBODY,QUALITATIVE: Hep B Surface Ab, Qual: NONREACTIVE

## 2018-10-24 LAB — HEPATITIS C ANTIBODY: Hep C Virus Ab: <0.1 s/co ratio (ref 0.0–0.9)

## 2018-10-26 ENCOUNTER — Telehealth: Payer: Self-pay | Admitting: *Deleted

## 2018-10-26 NOTE — Telephone Encounter (Signed)
Gave completed/signed [employer] form back to medical records to process for pt.

## 2018-10-27 ENCOUNTER — Telehealth: Payer: Self-pay | Admitting: Neurology

## 2018-10-27 DIAGNOSIS — G35 Multiple sclerosis: Secondary | ICD-10-CM

## 2018-10-27 MED ORDER — PREDNISONE 50 MG PO TABS: ORAL_TABLET | ORAL | 0 refills | Status: DC

## 2018-10-27 NOTE — Telephone Encounter (Signed)
Called pt. We gave [employer] form back to medical records yesterday to process for pt. I relayed this to pt.  She thinks she is due for MRI brain. Last MRI brain was 03/2018. Thought she was supposed to have one every 6 months.   Sx started about a few weeks ago for tightness in her right leg. Numbness started last night, constant. Numbness from bottom of calf and to tip of ankle.  No cramps. She got in bed last night and noticed she was still having this sx. Unable to feel herself touching her leg. She tried to massage but top of foot starting tingling. Had trouble sleeping last night d/t sx. She slept with legs elevated last night. Denies any signs of infection.   Spoke with Dr. Epimenio Foot. He would like to order MRI brain, cervical, thoracic spine w/ wo contrast.  Can offer either high dose oral steroid pack or IV steroids in the meantime to help with sx.   I called pt back and relayed above message. She verbalized understanding. She would like oral steroid pack. I sent to pharmacy. I placed MRI orders in Epic. She would like them done on [employer] mobile truck. She will call back if she has any further questions.

## 2018-10-27 NOTE — Telephone Encounter (Signed)
Pt called and states that the paper work that needs to be faxed in for her claim has a deadline of June 2nd. Also pt would like to schedule an MRI. And she would also like to report that in the last couple of days she has had tingling down her R side and numbness below from the calf and below. Please advise.

## 2018-10-27 NOTE — Telephone Encounter (Signed)
I faxed pt Lindsey Phelps form and faxed last office note on 10/27/18.

## 2018-10-28 NOTE — Telephone Encounter (Signed)
JCV ab drawn on 10/21/18 negative, index: 0.14

## 2018-11-01 ENCOUNTER — Telehealth: Payer: Self-pay | Admitting: Neurology

## 2018-11-01 NOTE — Telephone Encounter (Signed)
no to the covid-19 questions MR Brain w/wo contrast, MR Cervical spine w/wo contrast & MR Thoracic spine w/wo contrast Dr. Gershon Mussel Auth: [id number] (exp. 11/01/18 to 01/29/19). Patient is scheduled at [facility] for 11/03/18.

## 2018-11-02 ENCOUNTER — Telehealth: Payer: Self-pay | Admitting: Neurology

## 2018-11-02 NOTE — Telephone Encounter (Signed)
I called and LM  Labwork is fine.   We can: 1.   Continue Tysabri 2.   start [name] if she would like... 3.   Consider Mavenclad once pandemic issues are more clear.

## 2018-11-02 NOTE — Telephone Encounter (Signed)
She would like to start [name] and will sing the [facility] tomorrow when she is here for her MRI

## 2018-11-03 ENCOUNTER — Telehealth: Payer: Self-pay | Admitting: *Deleted

## 2018-11-03 ENCOUNTER — Ambulatory Visit (INDEPENDENT_AMBULATORY_CARE_PROVIDER_SITE_OTHER): Payer: BLUE CROSS/BLUE SHIELD

## 2018-11-03 ENCOUNTER — Other Ambulatory Visit: Payer: Self-pay

## 2018-11-03 DIAGNOSIS — G35 Multiple sclerosis: Secondary | ICD-10-CM | POA: Diagnosis not present

## 2018-11-03 MED ORDER — GADOBENATE DIMEGLUMINE 529 MG/ML IV SOLN
15.0000 mL | Freq: Once | INTRAVENOUS | Status: AC | PRN
  Administered 2018-11-03: 15 mL via INTRAVENOUS

## 2018-11-03 NOTE — Telephone Encounter (Signed)
PA [name] submitted on CMM. Key: [id number].  Waiting on determination.

## 2018-11-03 NOTE — Telephone Encounter (Signed)
Faxed completed/signed [name] start form to [employer] at [phone]. Received fax confirmation. 

## 2018-11-04 ENCOUNTER — Other Ambulatory Visit: Payer: Self-pay | Admitting: *Deleted

## 2018-11-04 ENCOUNTER — Telehealth: Payer: Self-pay | Admitting: *Deleted

## 2018-11-04 DIAGNOSIS — G35 Multiple sclerosis: Secondary | ICD-10-CM

## 2018-11-04 DIAGNOSIS — Z79899 Other long term (current) drug therapy: Secondary | ICD-10-CM

## 2018-11-04 NOTE — Telephone Encounter (Signed)
-----   Message from Asa Lente, MD sent at 11/03/2018  6:50 PM EDT ----- Please let her know that the MRI of the brain does not show any new lesions compared to the MRI from last year.  The MRIs of the spinal cord do not show any MS plaque.  There are no disc protrusions or herniations.

## 2018-11-04 NOTE — Telephone Encounter (Signed)
Per Dr. Epimenio Foot- okay for her to start aubagio now

## 2018-11-04 NOTE — Telephone Encounter (Signed)
Called patient. Relayed results per Dr. Epimenio Foot note. Pt verbalized understanding.    I relayed PA [name] approved. She heard from [facility] yesterday with [employer] [facility]. Pt aware she should let me know once she starts on medication so that we can check liver function monthly x 5 months.   Pt wants to know if she should start [name]. Should be receiving shipment today or tomorrow.  Tomorrow would be 4 weeks from her last Tysabri infusion. Advised I will double check with Dr. Epimenio Foot and call back and let her know. She verbalized understanding.

## 2018-11-04 NOTE — Telephone Encounter (Signed)
Received fax notification from [employer] that [name] approved 11/03/2018-11/03/2019. PA# [facility] rx [id number].

## 2018-11-04 NOTE — Telephone Encounter (Signed)
Called, LVM letting pt know she can start on [name] as soon as she receives it per Dr. Epimenio Foot. Asked her to let me know once she starts and we will set her up for first lab draw in about a month

## 2018-11-11 NOTE — Telephone Encounter (Signed)
Some people will have GI symptoms with [name] though they occur much less frequently than GI symptoms with Tecfidera.  We will see how she does and consider a change if she can tolerated long-term

## 2018-11-11 NOTE — Addendum Note (Signed)
Addended by: Rossie Muskrat L on: 11/11/2018 11:57 AM   Modules accepted: Orders

## 2018-11-11 NOTE — Telephone Encounter (Signed)
Called pt back. Scheduled first lab appt (1/5) for 12/13/18 at 1:15pm. This is her first monthly lab draw  For monitoring LFT while on [name].   She also reported that since stopping prednisone that was recently prescribed, she has had cystic like acne on her chin, chest and shoulders. Some spots on her stomach. She has also been nauseous daily that started prior to her starting [name] yesterday.   After starting [name] yesterday, she woke up early in the morning with stomach cramps, gas pains. She ended up having BM, not diarrhea. She is feeling better now. I advised her to continue to monitor her sx and if they worsen or if she develops new ones to let us know. She also spoke with Ms one to one nurse who recommended the same. Advised I will send message to Dr. Felecia Shelling and will only call back if he wants to change anything. She verbalized understanding.  FYI- pt decided not to start imipramine d/t potential SE. Her HA's have improved. She will let us know if she decides to start in the future.

## 2018-11-11 NOTE — Telephone Encounter (Signed)
Lindsey Phelps has been received and was started 11/10/2018

## 2018-11-18 ENCOUNTER — Telehealth: Payer: Self-pay | Admitting: *Deleted

## 2018-11-18 NOTE — Telephone Encounter (Signed)
Called pt. She started [name] around 11/10/2018. She is scheduled for first lab draw 12/13/2018. She has been a little dizzy after taking [name].

## 2018-12-13 ENCOUNTER — Other Ambulatory Visit: Payer: Self-pay

## 2018-12-13 ENCOUNTER — Telehealth: Payer: Self-pay | Admitting: Neurology

## 2018-12-13 ENCOUNTER — Other Ambulatory Visit (INDEPENDENT_AMBULATORY_CARE_PROVIDER_SITE_OTHER): Payer: Self-pay

## 2018-12-13 DIAGNOSIS — G35 Multiple sclerosis: Secondary | ICD-10-CM | POA: Diagnosis not present

## 2018-12-13 DIAGNOSIS — Z0289 Encounter for other administrative examinations: Secondary | ICD-10-CM

## 2018-12-13 DIAGNOSIS — Z79899 Other long term (current) drug therapy: Secondary | ICD-10-CM | POA: Diagnosis not present

## 2018-12-13 NOTE — Telephone Encounter (Signed)
Patient had labs done today and came to check out and requested to give a message to Dr. Garth Bigness nurse. Patient is inquiring about coming out of work permanently and is wondering if she will need an appointment to discuss this. I advised patient I would give this message to RN.

## 2018-12-13 NOTE — Telephone Encounter (Signed)
Disability is determined by the state not the doctors.   It would probably be best to have an updated office visit for them to review so we can have her make an appt

## 2018-12-13 NOTE — Telephone Encounter (Signed)
Dr. Sater- please advise 

## 2018-12-13 NOTE — Telephone Encounter (Signed)
Called pt. Scheduled appt for tomorrow at 11am with Dr. Felecia Shelling. Asked she check in around 1030/1045am, wear a mask and she is aware temp will be checked prior to coming back.

## 2018-12-14 ENCOUNTER — Other Ambulatory Visit: Payer: Self-pay

## 2018-12-14 ENCOUNTER — Telehealth: Payer: Self-pay | Admitting: *Deleted

## 2018-12-14 ENCOUNTER — Encounter: Payer: Self-pay | Admitting: Neurology

## 2018-12-14 ENCOUNTER — Ambulatory Visit: Payer: BC Managed Care – PPO | Admitting: Neurology

## 2018-12-14 VITALS — BP 110/70 | HR 73 | Temp 97.1°F | Ht 66.0 in | Wt 188.6 lb

## 2018-12-14 DIAGNOSIS — M542 Cervicalgia: Secondary | ICD-10-CM | POA: Diagnosis not present

## 2018-12-14 DIAGNOSIS — R5383 Other fatigue: Secondary | ICD-10-CM

## 2018-12-14 DIAGNOSIS — R269 Unspecified abnormalities of gait and mobility: Secondary | ICD-10-CM

## 2018-12-14 DIAGNOSIS — G35 Multiple sclerosis: Secondary | ICD-10-CM

## 2018-12-14 DIAGNOSIS — R4781 Slurred speech: Secondary | ICD-10-CM | POA: Diagnosis not present

## 2018-12-14 DIAGNOSIS — F09 Unspecified mental disorder due to known physiological condition: Secondary | ICD-10-CM

## 2018-12-14 DIAGNOSIS — R39198 Other difficulties with micturition: Secondary | ICD-10-CM

## 2018-12-14 LAB — HEPATIC FUNCTION PANEL
ALT: 21 IU/L (ref 0–32)
AST: 24 IU/L (ref 0–40)
Albumin: 4.4 g/dL (ref 3.8–4.8)
Alkaline Phosphatase: 69 IU/L (ref 39–117)
Bilirubin Total: 0.5 mg/dL (ref 0.0–1.2)
Bilirubin, Direct: 0.18 mg/dL (ref 0.00–0.40)
Total Protein: 6.4 g/dL (ref 6.0–8.5)

## 2018-12-14 MED ORDER — AMPHETAMINE-DEXTROAMPHET ER 15 MG PO CP24: 15.0000 mg | ORAL_CAPSULE | ORAL | 0 refills | Status: DC

## 2018-12-14 NOTE — Telephone Encounter (Signed)
-----   Message from Britt Bottom, MD sent at 12/14/2018  8:55 AM EDT ----- Please let the patient know that the liver function tests were fine.

## 2018-12-14 NOTE — Progress Notes (Addendum)
[facility]  PATIENT: Lindsey Phelps DOB: Jul 27, 1980  REFERRING DOCTOR OR PCP:  Elfredia NevinsLawrence Fusco SOURCE: Patient, notes from Dr. Sherwood GamblerFusco, laboratory results.  _________________________________   HISTORICAL  CHIEF COMPLAINT:  Chief Complaint  Patient presents with   Follow-up    RM 12, alone. Last seen 10/21/18. Here to discuss going on permanent disability.   Disablity    Her supervisor and manager aware she is trying to get disability. States once Dr. Epimenio FootSater sees her, she will contact leave and accomodation department to get paperwork sent to our office to fill out. (provided pt with fax: [phone]) and had her sign record release form. Start date of disability: 12/14/2018    HISTORY OF PRESENT ILLNESS:  Marcelo BaldyBonnie Hofland is a 38 y.o. woman with MS with multiple tumefactive lesions.  Update 12/14/2018: She switched from Tysabri to [name] and is tolerating it well.   LFTs have been fine.  She is noting continued poor gait and some gait ataxia.   The right leg is numb at times and clumsier than the left.   She notes reduced right grip (she is right handed) and mild right leg weakness.     She is noting more urinary urgency and frequency.  She has had some visual changes and depth perception seems off.    She has trouble with cognitive tasks, especially learning, processing speed and focus (easily gets distracted and gets off task), verbal fluency, spelling.     She also has fatigue that worsens as the day goes on.  She cannot process when two people talk at once or there are distractions.   Cognitive and fatigue issues occur at work and at home.    She also has some anxiety.   Valium helps her fall asleep.  Buspar helped her anxiety a little bit  The occipital neuralgia neck and headache pain improved after the splenius capitus/occipitla nerve injection in May.  She works as a Conservation officer, naturecashier at [employer].   Previously, she was doing returns but had had difficulty with the  physical component of the job prompting a switch to Conservation officer, naturecashier.    However, due to poor balance and slurred speech, she is having more difficulty doing her job as a Conservation officer, naturecashier.    She notes that her symptoms worsen the longer she stands.   She is unable to sit on the job and unable to take breaks at her discretion.   She also notes cognitive issues at work and has had trouble adjusting to a new register system  Update 10/21/2018: She is on Tysabri.  She initially tolerated it well but over the last 4 infusions she will experience more pain in the right occipital region and temple.    She also notes pressure in her right eye.   There is a squeezing sensation.   The last infusion was associated with pain starting during the infusion.   Last year, she just had headaches intermittently.   She started Tysabri in March, 2019.  She would like to stop Tysabri due to the side effects and not to go on any disease modifying therapy.  I had a conversation with her about the possibility that she has an aggressive MS due to the MRI changes that were occurring in late 2018 in early 2019 with notable tumefactive lesions.  She appears to have done well on Tysabri.  She was most interested in no therapy but will consider [name].  If the COVID endemic improves Mavenclad might be something that  she would be more interested in due to the short course and long duration of efficacy.  I gave her information on both treatments.  Physically she is doing about the same.  Her gait is off balance.  Strength is doing ok.  She does not note major numbness.  Her slurred speech resolved for the most part.  There is some urinary frequency but no recent incontinence..  She also notes more cognitive fog and more mood irritability over the last several months as well.  She also has fatigue. \ She is reporting a headache starting in the right occiput and radiating behind the right eye.  This is similar to headache in the past that responded to  injections.  Her mom had optic neuritis about 20 years ago and some MRI changes initially felt to possibly be MS but she was never placed on any disease modifying therapy.  Update 04/22/2018: She feels her MS has been mostly stable.  She is on Tysabri.  She tolerates it well.  Her next infusion will be tomorrow.  The last JCV antibody titer was 0.17 (-) on 11/20/2017.  Her last MRI was 03/03/2018 and showed a stable number of lesions.  Some were reduced in size.  None of them enhanced.  Gait is doing well.   She gets rare jerks while walking but these have not worsened.   No new numbness or weakness.    Slurred speech resolved (occasioanl mild if tired).   She has urinary urgency and rare urge incontinence.   She has occasioanl word finding difficulty, also much better.  Neck pain and headaches improved after the splenius capitus TPI.   HA also better after getting her new glasses.      She took care of a cat with rabies but did not get bit (other family did and got treated -- no one had clinical rabies).  That was early September.     Update 03/08/2018: She is on Tysabri and tolerates it well.    The last JCV Ab titer was 0.17 negative on 11/20/2017.    She reports constant dizziness with eye pain and blurriness, R > left, for the past 8 days.      She also has had right occipital tenderness x 6-8 weeks sometimes feeling like an icepick. Last Wednesday, she was in bed and suddenly in the right leg and she had pins/needles sensation in the right hand.   She also noted worse visual disturbance bilaterally.     Sometimes swallowing seems difficult.    She gets a lot of nasal congestion.    MRI does not show sinusitis.     She was taking care of a cat with rabies and family got the rabies shots but she did not.    She was not scratched or bitten.   She last touched the cat 01/31/2018.     She went to the ED 03/03/2018 and an MRI was performed.   I personally reviewed the images.  There were no new lesions  and the MRI is essentially unchanged compared to 11/10/2017 and improved compared to earlier ones..     IMPRESSION: 1. No acute intracranial process. 2. Stable number and stable to decreased size of supratentorial white matter lesions most compatible with chronic demyelination (tumefactive component). No enhancement. No parenchymal brain volume loss for age.  UPDATE 11/20/2017: She tolerated the first two infusions well but has noted pressure in her sinuses and altered since the thirs infusion.  She is also reporting more pain in her legs and muscle cramps the last month.    She also has a stabbing pain in her chest similar to how she felt with pneumonia.   She continues to note word finding errors and it seems to be more fluctuating.     She had a 40 minute episode of palpitations/tachycardia and increased anxiety a few weeks ago.    She still notes some numbness in her mouth but it is different.    IMPRESSION: This MRI of the brain with and without contrast shows the following: 1.    Multiple T2/flair hyperintense foci in the juxtacortical and periventricular white matter in a pattern and configuration consistent with chronic demyelinating plaque associated with multiple sclerosis.  When compared to the prior study 07/28/2017, lesions that enhanced on the previous study no longer do so.  There are no new lesions.  The previous MRI showed severe edema associated with most of the foci,  consistent with tumefactive MS. 2.    There is a normal enhancement pattern and there are no acute findings.  Update 08/06/2017: Since the last visit, her cerebrospinal fluid was borderline with 3 oligoclonal bands. MRI of the spinal cord did not show any additional lesions. There was no evidence of tumor on chest/abdomen/pelvic CT scan.     Additionally  she saw Dr. Venetia Maxon in neurosurgery. He also ran her case by neuroradiology and neuro-oncology. After the steroids, there was an improved appearance on the MRI. There  were no new lesions.   I discussed with body that the likelihood that she has tumefactive MS is probably higher than the likelihood of a CNS lymphoma or other disorder.   Therefore an option would be to hold off on a brain biopsy and do several months treatment and then reimage. On re-imaging, if there is worsening, we would need to reconsider a brain biopsy.  Clinically, her speech is better.  Balance is better.    She notes positional numbness in her hands, worse with tasks like holding a phone.  Sometimes she wakes up with the numbness.    It then improves.      On Buspar, anxiety improved but she got headaches.    On 5 mg po bid, she is still getting headache but they are tolerable and her anxiety is still better.    Update 07/09/2017: She woke up Friday morning with more aphasia and dysphagia.   She has had some difficulty with her gait but it was no worse over the last month.   She also has had more trouble with cognition and processing speed and executive functioning over the past month.    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    She has noted mild visual changes over time but notes her glasses are old and no asymmetry or color vision changes.   She denies any bladder issues.    She feels much more tired over the past 3 months.     She sleeps ok if she takes valium at night.     She was admitted to [facility] last week when she presented with worsening gait and worsening aphasia. An MRI was performed. Besides the left frontal lesion noted on the previous MRI, she had 4 additional large edema tenderness lesions that enhanced, one in the left frontal lobe, 2 in the right frontal lobe and another in the right temporal lobe. He also has a couple of punctate T2/FLAIR hyperintense foci  that are less specific areas the small lesions did not enhance.   She received 2 days of IV Solu-Medrol and was discharged yesterday.  Update 06/11/2017: I first saw her 03/31/2017.   Was concerned  about her word finding difficulties and an MRI of the brain was performed. It showed a subacute left frontal stroke. There were a few other punctate T2/FLAIR hyperintense foci consistent with minimal chronic microvascular ischemic change.   I was most concerned about an embolic etiology. The carotid ultrasound was essentially normal. The cardiac event monitor was essentially normal.  A TEE showed a normal ejection fraction of 60-65 percent.   There was no evidence of a patent foramen ovale or other right-to-left shunt. The study was felt to be normal.  She is on aspirin, metoprolol and Crestor.  Here to the previous visit, she feels her speech is not much  Better abd she still has some aphasia and feels she needs to concentrate more with her speech.  She feels her right arm is still clumsy.   She has noted she spells words wrong a lot and her typing is slowed with more errors.   She still has some slurring. There are no new symptoms.l;  She also has some anxiety and BuSpar was started.   It helped some but may have contributed to dizziness and headaches so the dose was decreased to 5 mg twice a day.    Headaches are less frequent in general (she was having more at last visit).   She will get occasional sharp pains.    She used to smoke but none in 4 years (quit after dad had lung cancer). No OCP (had hysterectomy)  She has FH of strokes and MI's .    From 03/31/2017: I had the pleasure seeing you patient, Talley Casco, at [facility] for neurologic consultation regarding her numbness, word finding difficulties, balance issues and other neurologic symptoms.  She is a 38 yo woman who had an episode with chest pain and bilateral arm numbness on 01/27/2016 and was taken to the ED (workup was normal).  She who noted numbness in her jaw at night about 5 months ago when starting to fall asleep.  In August 2018, she had another episode of chest tightness and arm numbness and went to an ED.    Since then, she has noted more symptoms such as slurred speech, verbal fluency issues, leg weakness and balance issues.    These symptoms mostly come and go for 10 - 30 minutes and now occur daily.   They occur randomly and in any position (sitting, standing, walking).    She also notes vision is blurry and she has episodes of worsening blurry vision, also lasting 10-30 minutes.   She has vertigo that is mild but constant.   It intensifies for a few minutes at a time sometimes while she is walking.      She has had panic attacks since 2016 after her brother was in a serious accident but she feels current symptoms are different.  She takes valium prn, just a few a month.  Many years ago, she was on Celexa.   She continues to note episodes of palpitations and tachycardia.   Metoprolol was started with benefit.     I reviewed the laboratory results from 02/21/2017. B12 is low-normal at 289. TSH, CBC and CMP are normal. LDL  and triglycerides were slightly elevated.   01/25/2017 CTA of the chest and CXR were normal.  She had a 30 day event monitor that showed intermittent tachycardia by her reports.  An Echo ws reportedly normal.    REVIEW OF SYSTEMS: Constitutional: No fevers, chills, sweats, or change in appetite.   She has mild sleep onset insomnia Eyes: No visual changes, double vision, eye pain Ear, nose and throat: No hearing loss, ear pain, nasal congestion, sore throat Cardiovascular: No chest pain.  Has intermittent tachycardia.  Respiratory: No shortness of breath at rest or with exertion.   No wheezes GastrointestinaI: No nausea, vomiting, diarrhea, abdominal pain, fecal incontinence Genitourinary: No dysuria, urinary retention or frequency.  No nocturia. Musculoskeletal: No neck pain, back pain Integumentary: No rash, pruritus, skin lesions Neurological: as above Psychiatric: No depression at this time.  H/o panic attacks. Endocrine: No palpitations, diaphoresis, change in appetite,  change in weigh or increased thirst Hematologic/Lymphatic: No anemia, purpura, petechiae. Allergic/Immunologic: has some seasonal allergies  ALLERGIES: Allergies  Allergen Reactions   Darvocet [Propoxyphene N-Acetaminophen] Hives   Demerol Hives   Adhesive [Tape] Rash and Other (See Comments)    Including bandaids - takes skin off, peels    HOME MEDICATIONS:  Current Outpatient Medications:    cetirizine-pseudoephedrine (ZYRTEC-D) 5-120 MG tablet, Take 1 tablet by mouth 2 (two) times daily., Disp: , Rfl:    diazepam (VALIUM) 10 MG tablet, Take 10 mg by mouth at bedtime as needed for anxiety. , Disp: , Rfl:    ibuprofen (ADVIL) 200 MG tablet, Take 200 mg by mouth every 6 (six) hours as needed., Disp: , Rfl:    Teriflunomide ([name]) 14 MG TABS, Take by mouth., Disp: , Rfl:    imipramine (TOFRANIL) 25 MG tablet, Take 1 tablet (25 mg total) by mouth at bedtime. (Patient not taking: Reported on 11/11/2018), Disp: 30 tablet, Rfl: 5 No current facility-administered medications for this visit.   Facility-Administered Medications Ordered in Other Visits:    gadopentetate dimeglumine (MAGNEVIST) injection 18 mL, 18 mL, Intravenous, Once PRN, Tynisa Vohs A, MD   gadopentetate dimeglumine (MAGNEVIST) injection 18 mL, 18 mL, Intravenous, Once PRN, Addeline Calarco, Pearletha Furlichard A, MD  PAST MEDICAL HISTORY: Past Medical History:  Diagnosis Date   Anxiety    Cold    Heart palpitations    High cholesterol    History of IBS    Hypertension    Multiple sclerosis (HCC)    Stroke (HCC)     PAST SURGICAL HISTORY: Past Surgical History:  Procedure Laterality Date   ABDOMINAL HYSTERECTOMY     BLADDER NECK SUSPENSION     CHOLECYSTECTOMY     COLONOSCOPY  01/23/2011   Procedure: COLONOSCOPY;  Surgeon: Malissa HippoNajeeb U Rehman, MD;  Location: [facility];  Service: Endoscopy;  Laterality: N/A;   LAPAROSCOPIC TUBAL LIGATION     TEE WITHOUT CARDIOVERSION N/A 05/05/2017   Procedure:  TRANSESOPHAGEAL ECHOCARDIOGRAM (TEE);  Surgeon: Laqueta LindenKoneswaran, Suresh A, MD;  Location: [facility];  Service: Cardiovascular;  Laterality: N/A;    FAMILY HISTORY: Family History  Problem Relation Age of Onset   Diabetes Father    Hypertension Father    Colon polyps Father    Lung cancer Father    Hypertension Mother    Coronary artery disease Mother    COPD Mother     SOCIAL HISTORY:  Social History   Socioeconomic History   Marital status: Married    Spouse name: Not on file   Number of children: Not on file   Years of education: Not on file   Highest education level: Not  on file  Occupational History   Not on file  Social Needs   Financial resource strain: Not on file   Food insecurity    Worry: Not on file    Inability: Not on file   Transportation needs    Medical: Not on file    Non-medical: Not on file  Tobacco Use   Smoking status: Former Smoker    Packs/day: 0.50    Years: 15.00    Pack years: 7.50    Types: Cigarettes    Quit date: 09/16/2012    Years since quitting: 6.2   Smokeless tobacco: Never Used  Substance and Sexual Activity   Alcohol use: Yes    Alcohol/week: 1.0 standard drinks    Types: 1 Shots of liquor per week    Comment: every few months   Drug use: No   Sexual activity: Yes    Birth control/protection: Surgical  Lifestyle   Physical activity    Days per week: Not on file    Minutes per session: Not on file   Stress: Not on file  Relationships   Social connections    Talks on phone: Not on file    Gets together: Not on file    Attends religious service: Not on file    Active member of club or organization: Not on file    Attends meetings of clubs or organizations: Not on file    Relationship status: Not on file   Intimate partner violence    Fear of current or ex partner: Not on file    Emotionally abused: Not on file    Physically abused: Not on file    Forced sexual activity: Not on file  Other  Topics Concern   Not on file  Social History Narrative   Not on file     PHYSICAL EXAM  Vitals:   12/14/18 1054  BP: 110/70  Pulse: 73  Temp: (!) 97.1 F (36.2 C)  SpO2: 98%  Weight: 188 lb 9.6 oz (85.5 kg)  Height: 5\' 6"  (1.676 m)    Body mass index is 30.44 kg/m.   General: The patient is well-developed and well-nourished and in no acute distress.   Has only mild right occipital tenderness now, much better than last visit. Neurologic Exam  Mental status: She is alert and oriented x 3 at the time of the examination. The patient has apparent normal recent and remote memory, with an apparently normal attention span and concentration ability.  Speech is mildly slurred and she had trouble coming up with the right words a few times.   There is a general slowness of her thought process.     Cranial nerves:   Color vision is symmetric.  Extraocular movements are full.  Facial strength and sensation was normal.  Trapezius strength was normal.  The tongue is midline, and the patient has symmetric elevation of the soft palate. No obvious hearing deficits are noted.  Motor:  Muscle bulk is normal.   Tone is normal. She has decreased right grip and right foot.  Strength is 5/5 on the left  Sensory: Intact sensation to touch and vibration in the arms and legs.   Coordination: Cerebellar testing reveals good finger-nose-finger and left heel-to-shin.  Mildly reduced right heel to shin  Gait and station: Station is normal.  The gait is slightly wide.  Tandem gait is wide.  Romberg is negative.   Reflexes: Deep tendon reflexes are symmetric and slightly increased in the arms, increased  at the knees with crossed adductors and normal at the ankles..   Plantar responses are flexor.    DIAGNOSTIC DATA (LABS, IMAGING, TESTING) - I reviewed patient records, labs, notes, testing and imaging myself where available.  Lab Results  Component Value Date   WBC 8.7 10/21/2018   HGB 13.3  10/21/2018   HCT 38.4 10/21/2018   MCV 87 10/21/2018   PLT 255 10/21/2018      Component Value Date/Time   NA 136 03/03/2018 1736   K 3.4 (L) 03/03/2018 1736   CL 105 03/03/2018 1736   CO2 23 03/03/2018 1736   GLUCOSE 85 03/03/2018 1736   BUN 12 03/03/2018 1736   CREATININE 0.70 03/03/2018 1740   CALCIUM 8.8 (L) 03/03/2018 1736   PROT 6.4 12/13/2018 1325   ALBUMIN 4.4 12/13/2018 1325   AST 24 12/13/2018 1325   ALT 21 12/13/2018 1325   ALKPHOS 69 12/13/2018 1325   BILITOT 0.5 12/13/2018 1325   GFRNONAA >60 03/03/2018 1736   GFRAA >60 03/03/2018 1736       ASSESSMENT AND PLAN  1. Multiple sclerosis (HCC)   2. Slurred speech   3. Gait disturbance   4. Neck pain   5. Urinary dysfunction   6. Cognitive deficit secondary to MS (HCC)   7. Other fatigue     1.    For MS continue [name] and check monthly LFTs x 4 more months  2.    Stay active and exercise as tolerated. 3.   Trial of a stimulant Adderall XR.   If trouble tolerating it, we can try phentermine, another stimulant she has been on in the past.  . 4.   Due to her physical impairments, cognitive issues and fatigue, she is unable to do her job.   5.   If anxiety worsens, consider retrial of Buspar 6.   Return to see me in 5 to 6 months or sooner if there are new or worsening neurologic symptoms.     Rogerio Boutelle A. Epimenio Foot, MD, PhD, FAAN Certified in Neurology, Clinical Neurophysiology, Sleep Medicine, Pain Medicine and Neuroimaging Director, [facility] at [facility]  [facility] [address], [address] [place], [place] [address] [phone]

## 2018-12-21 ENCOUNTER — Telehealth: Payer: Self-pay | Admitting: *Deleted

## 2018-12-21 NOTE — Telephone Encounter (Signed)
Gave completed/signed disability forms back to medical records to process for pt. 

## 2018-12-23 NOTE — Telephone Encounter (Signed)
Received accomodation substantiation form to be filled out Per Dr. Felecia Shelling, due to physical impairments, cognitive issues and fatigue, she is unable to work. Accommodations would not be beneficial. Gave completed/signed form back to medical records to process for pt.

## 2018-12-28 ENCOUNTER — Telehealth: Payer: Self-pay | Admitting: *Deleted

## 2018-12-28 NOTE — Telephone Encounter (Signed)
Pt [employer] form faxed on 12/28/18

## 2018-12-30 ENCOUNTER — Telehealth: Payer: Self-pay | Admitting: *Deleted

## 2018-12-30 NOTE — Telephone Encounter (Signed)
Pt [employer] disability form faxed to [phone]

## 2019-01-10 ENCOUNTER — Other Ambulatory Visit: Payer: Self-pay | Admitting: *Deleted

## 2019-01-10 ENCOUNTER — Telehealth: Payer: Self-pay | Admitting: Neurology

## 2019-01-10 DIAGNOSIS — G35 Multiple sclerosis: Secondary | ICD-10-CM

## 2019-01-10 DIAGNOSIS — Z79899 Other long term (current) drug therapy: Secondary | ICD-10-CM

## 2019-01-10 NOTE — Telephone Encounter (Signed)
Patient stated she needs to get labs done because her last one she had done was July 13. She also stated she needs to drop of paper work from being out of work.

## 2019-01-10 NOTE — Telephone Encounter (Signed)
Called pt. Scheduled lab appt for 01/13/19 at 11:30am. She is on [name] and needs monthly hepatic function panel. Placed lab order. She will drop paperwork off at front desk when she comes in for labs.

## 2019-01-13 ENCOUNTER — Other Ambulatory Visit: Payer: Self-pay

## 2019-01-13 ENCOUNTER — Other Ambulatory Visit (INDEPENDENT_AMBULATORY_CARE_PROVIDER_SITE_OTHER): Payer: Self-pay

## 2019-01-13 DIAGNOSIS — Z79899 Other long term (current) drug therapy: Secondary | ICD-10-CM | POA: Diagnosis not present

## 2019-01-13 DIAGNOSIS — Z0289 Encounter for other administrative examinations: Secondary | ICD-10-CM

## 2019-01-13 DIAGNOSIS — G35 Multiple sclerosis: Secondary | ICD-10-CM | POA: Diagnosis not present

## 2019-01-14 LAB — HEPATIC FUNCTION PANEL
ALT: 29 IU/L (ref 0–32)
AST: 39 IU/L (ref 0–40)
Albumin: 4.9 g/dL — ABNORMAL HIGH (ref 3.8–4.8)
Alkaline Phosphatase: 78 IU/L (ref 39–117)
Bilirubin Total: 0.4 mg/dL (ref 0.0–1.2)
Bilirubin, Direct: 0.07 mg/dL (ref 0.00–0.40)
Total Protein: 7 g/dL (ref 6.0–8.5)

## 2019-01-17 ENCOUNTER — Telehealth: Payer: Self-pay | Admitting: *Deleted

## 2019-01-17 DIAGNOSIS — R42 Dizziness and giddiness: Secondary | ICD-10-CM | POA: Diagnosis not present

## 2019-01-17 DIAGNOSIS — S134XXA Sprain of ligaments of cervical spine, initial encounter: Secondary | ICD-10-CM | POA: Diagnosis not present

## 2019-01-17 DIAGNOSIS — S233XXA Sprain of ligaments of thoracic spine, initial encounter: Secondary | ICD-10-CM | POA: Diagnosis not present

## 2019-01-17 DIAGNOSIS — S338XXA Sprain of other parts of lumbar spine and pelvis, initial encounter: Secondary | ICD-10-CM | POA: Diagnosis not present

## 2019-01-17 MED ORDER — ARMODAFINIL 200 MG PO TABS: ORAL_TABLET | ORAL | 5 refills | Status: DC

## 2019-01-17 NOTE — Telephone Encounter (Signed)
Pt called, has medication question. Please call [phone]

## 2019-01-17 NOTE — Addendum Note (Signed)
Addended by: Hope Pigeon on: 01/17/2019 02:47 PM   Modules accepted: Orders

## 2019-01-17 NOTE — Telephone Encounter (Signed)
Due to the symptoms, it would probably be best to switch to a different medication.  We could try armodafinil 200 mg daily to see if that can help her symptoms but be better tolerated.

## 2019-01-17 NOTE — Telephone Encounter (Signed)
Called pt back to get further information. Pt reports that she started adderall 12/25/18. She did well the first few days of taking medication. On 12/29/18 she started having the following sx: out of breathe, winded, coughing spells when sitting or standing. Last night she got out of the shower and started having a coughing fit.  She is not waking up as early since starting adderall. Has to set alarm and harder to get up in the morning. Having dizziness daily. Having to stop and hold on to shelf/aisle at grocery store while walking. Denies any fever or exposure to covid-19. Last time she went to the grocery store was about a month ago. Has about 5-6 pills of adderall left and due for refill soon. Wanting to know if she should continue adderall or stop.  Also having diarrhea every couple days. She is taking imodium but getting dehydrated. She is not interested in med. She will try increasing fluid intake first and call back if sx do not improve.  She also saw chiropractor today who is going to try and help with dizziness/depth perception. He provided her exercises to do at home. Had tens unit placed on neck/lower back today.   Advised I will discuss with Dr. Felecia Shelling and call back with recommendation.

## 2019-01-17 NOTE — Telephone Encounter (Signed)
Dr. Sater- what would you recommend? 

## 2019-01-17 NOTE — Telephone Encounter (Signed)
Called pt. Relayed Dr. Garth Bigness recommendation. She is agreeable to try armodafinil and stop adderall. I -escribed to [facility].  She also asking about FMLA forms dropped off last Thursday. Advised I received today and will try to complete this week for her. She verbalized understanding.

## 2019-01-17 NOTE — Addendum Note (Signed)
Addended by: Arlice Colt A on: 01/17/2019 04:50 PM   Modules accepted: Orders

## 2019-01-18 ENCOUNTER — Telehealth: Payer: Self-pay | Admitting: *Deleted

## 2019-01-18 DIAGNOSIS — Z0289 Encounter for other administrative examinations: Secondary | ICD-10-CM

## 2019-01-18 MED ORDER — PHENTERMINE HCL 37.5 MG PO CAPS: 37.5000 mg | ORAL_CAPSULE | ORAL | 5 refills | Status: DC

## 2019-01-18 NOTE — Telephone Encounter (Signed)
PA armodafinil initiated on CMM. Key: [id number]. In process of completing.

## 2019-01-18 NOTE — Addendum Note (Signed)
Addended by: Hope Pigeon on: 01/18/2019 02:58 PM   Modules accepted: Orders

## 2019-01-18 NOTE — Addendum Note (Signed)
Addended by: Arlice Colt A on: 01/18/2019 05:01 PM   Modules accepted: Orders

## 2019-01-18 NOTE — Telephone Encounter (Signed)
PA denied. Will only be covered by pt insurance if she has the following dx: narcolepsy confirmed by sleep lab testing, shift work disorder, OSA confirmed by testing. She can get prescription at a [facility] for 38 dollars #30/30days using goodrx coupon. I will call pt to discuss.

## 2019-01-18 NOTE — Telephone Encounter (Signed)
I called pt. Relayed below information. She would rather try phentermine again which she has been on in the past and discussed with Dr. Felecia Shelling at last office visit. Advised I will speak with Dr. Felecia Shelling to get verbal to switch. Phentermine may also require PA. I will update her once I know more. She would like rx to go to [facility] on file.

## 2019-01-18 NOTE — Telephone Encounter (Signed)
PA submitted. Waiting on determination.  

## 2019-01-19 ENCOUNTER — Telehealth: Payer: Self-pay | Admitting: *Deleted

## 2019-01-19 DIAGNOSIS — R42 Dizziness and giddiness: Secondary | ICD-10-CM | POA: Diagnosis not present

## 2019-01-19 DIAGNOSIS — S233XXA Sprain of ligaments of thoracic spine, initial encounter: Secondary | ICD-10-CM | POA: Diagnosis not present

## 2019-01-19 DIAGNOSIS — S134XXA Sprain of ligaments of cervical spine, initial encounter: Secondary | ICD-10-CM | POA: Diagnosis not present

## 2019-01-19 DIAGNOSIS — S338XXA Sprain of other parts of lumbar spine and pelvis, initial encounter: Secondary | ICD-10-CM | POA: Diagnosis not present

## 2019-01-19 NOTE — Telephone Encounter (Addendum)
Submitted PA. PA approved 01/19/2019 - 04/19/2019 PA# [employer] [id number]. Faxed notice of approval to [facility] at [phone]. Received fax confirmation.

## 2019-01-19 NOTE — Telephone Encounter (Signed)
Initiated PA phentermine on CMM. [id number]. In process of completing.

## 2019-01-21 DIAGNOSIS — F419 Anxiety disorder, unspecified: Secondary | ICD-10-CM | POA: Diagnosis not present

## 2019-01-21 DIAGNOSIS — Z6829 Body mass index (BMI) 29.0-29.9, adult: Secondary | ICD-10-CM | POA: Diagnosis not present

## 2019-01-21 DIAGNOSIS — R202 Paresthesia of skin: Secondary | ICD-10-CM | POA: Diagnosis not present

## 2019-01-21 DIAGNOSIS — G35 Multiple sclerosis: Secondary | ICD-10-CM | POA: Diagnosis not present

## 2019-01-31 ENCOUNTER — Telehealth: Payer: Self-pay | Admitting: Neurology

## 2019-01-31 DIAGNOSIS — Z0289 Encounter for other administrative examinations: Secondary | ICD-10-CM

## 2019-01-31 NOTE — Telephone Encounter (Signed)
Pt called stating that she was informed that the disability forms were filled out incorrectly. The first page apparently was left blank with no dates on it. She is needing this taken care of as soon as possible or she will loose the job. Please advise.

## 2019-01-31 NOTE — Telephone Encounter (Signed)
Lindsey Phelps- can you check on this? Last forms completed on 12/20/18 were complete (I reviewed). The forms received after that you were told by her that she did not need them and to disregard?

## 2019-02-01 ENCOUNTER — Telehealth: Payer: Self-pay | Admitting: *Deleted

## 2019-02-01 DIAGNOSIS — R42 Dizziness and giddiness: Secondary | ICD-10-CM | POA: Diagnosis not present

## 2019-02-01 DIAGNOSIS — S233XXA Sprain of ligaments of thoracic spine, initial encounter: Secondary | ICD-10-CM | POA: Diagnosis not present

## 2019-02-01 DIAGNOSIS — S338XXA Sprain of other parts of lumbar spine and pelvis, initial encounter: Secondary | ICD-10-CM | POA: Diagnosis not present

## 2019-02-01 DIAGNOSIS — S134XXA Sprain of ligaments of cervical spine, initial encounter: Secondary | ICD-10-CM | POA: Diagnosis not present

## 2019-02-01 NOTE — Telephone Encounter (Signed)
Gave completed/signed disability forms to medical records to process for pt.

## 2019-02-01 NOTE — Telephone Encounter (Signed)
I faxed pt Lindsey Phelps disability form to [phone] on 02/01/19.

## 2019-02-08 DIAGNOSIS — S134XXA Sprain of ligaments of cervical spine, initial encounter: Secondary | ICD-10-CM | POA: Diagnosis not present

## 2019-02-08 DIAGNOSIS — R42 Dizziness and giddiness: Secondary | ICD-10-CM | POA: Diagnosis not present

## 2019-02-08 DIAGNOSIS — S233XXA Sprain of ligaments of thoracic spine, initial encounter: Secondary | ICD-10-CM | POA: Diagnosis not present

## 2019-02-08 DIAGNOSIS — S338XXA Sprain of other parts of lumbar spine and pelvis, initial encounter: Secondary | ICD-10-CM | POA: Diagnosis not present

## 2019-02-10 ENCOUNTER — Telehealth: Payer: Self-pay | Admitting: *Deleted

## 2019-02-10 NOTE — Telephone Encounter (Signed)
Sent mychart message

## 2019-02-14 ENCOUNTER — Telehealth: Payer: Self-pay | Admitting: *Deleted

## 2019-02-14 ENCOUNTER — Other Ambulatory Visit: Payer: Self-pay | Admitting: *Deleted

## 2019-02-14 ENCOUNTER — Other Ambulatory Visit: Payer: Self-pay

## 2019-02-14 ENCOUNTER — Other Ambulatory Visit (INDEPENDENT_AMBULATORY_CARE_PROVIDER_SITE_OTHER): Payer: Self-pay

## 2019-02-14 DIAGNOSIS — G35 Multiple sclerosis: Secondary | ICD-10-CM

## 2019-02-14 DIAGNOSIS — Z0289 Encounter for other administrative examinations: Secondary | ICD-10-CM

## 2019-02-14 DIAGNOSIS — Z79899 Other long term (current) drug therapy: Secondary | ICD-10-CM

## 2019-02-14 NOTE — Telephone Encounter (Signed)
Dr. Sater- please advise 

## 2019-02-14 NOTE — Telephone Encounter (Signed)
Patient was in this morning for lab work.  States she has had hair loss since starting [name] and knows this is something that happens with this drug.  She lost so much she went ahead and shaved her head.  Her concern is that her scalp is red.  Is this normal?  Please call.

## 2019-02-14 NOTE — Telephone Encounter (Signed)
I called and spoke with pt. Relayed Dr. Garth Bigness recommendation. She verbalized understanding.  She is going to go see dermatologist about red scalp.  She will call back if hair loss worsens.

## 2019-02-14 NOTE — Telephone Encounter (Signed)
Please let her knw that some hair loss is common but I don't know why the scalp is red.   If it worsens, let us know.  The hair loss usually begins to improve by 6 months.

## 2019-02-15 ENCOUNTER — Telehealth: Payer: Self-pay | Admitting: *Deleted

## 2019-02-15 ENCOUNTER — Other Ambulatory Visit: Payer: Self-pay | Admitting: *Deleted

## 2019-02-15 DIAGNOSIS — S233XXA Sprain of ligaments of thoracic spine, initial encounter: Secondary | ICD-10-CM | POA: Diagnosis not present

## 2019-02-15 DIAGNOSIS — G35 Multiple sclerosis: Secondary | ICD-10-CM

## 2019-02-15 DIAGNOSIS — S338XXA Sprain of other parts of lumbar spine and pelvis, initial encounter: Secondary | ICD-10-CM | POA: Diagnosis not present

## 2019-02-15 DIAGNOSIS — R42 Dizziness and giddiness: Secondary | ICD-10-CM | POA: Diagnosis not present

## 2019-02-15 DIAGNOSIS — S134XXA Sprain of ligaments of cervical spine, initial encounter: Secondary | ICD-10-CM | POA: Diagnosis not present

## 2019-02-15 DIAGNOSIS — Z79899 Other long term (current) drug therapy: Secondary | ICD-10-CM

## 2019-02-15 LAB — HEPATIC FUNCTION PANEL
ALT: 23 IU/L (ref 0–32)
AST: 22 IU/L (ref 0–40)
Albumin: 4.5 g/dL (ref 3.8–4.8)
Alkaline Phosphatase: 86 IU/L (ref 39–117)
Bilirubin Total: 0.5 mg/dL (ref 0.0–1.2)
Bilirubin, Direct: 0.18 mg/dL (ref 0.00–0.40)
Total Protein: 6.9 g/dL (ref 6.0–8.5)

## 2019-02-15 NOTE — Telephone Encounter (Signed)
-----   Message from Britt Bottom, MD sent at 02/15/2019  8:34 AM EDT ----- Please let the patient know that the lab work is fine.

## 2019-03-07 ENCOUNTER — Telehealth: Payer: Self-pay | Admitting: *Deleted

## 2019-03-07 NOTE — Telephone Encounter (Signed)
Gave completed/signed [employer] form back to medical records to process for pt. 

## 2019-03-08 ENCOUNTER — Telehealth: Payer: Self-pay | Admitting: *Deleted

## 2019-03-08 DIAGNOSIS — L308 Other specified dermatitis: Secondary | ICD-10-CM | POA: Diagnosis not present

## 2019-03-08 NOTE — Telephone Encounter (Signed)
I faxed pt [employer] form on 03/08/19 to [phone]

## 2019-03-09 DIAGNOSIS — R42 Dizziness and giddiness: Secondary | ICD-10-CM | POA: Diagnosis not present

## 2019-03-09 DIAGNOSIS — S134XXA Sprain of ligaments of cervical spine, initial encounter: Secondary | ICD-10-CM | POA: Diagnosis not present

## 2019-03-09 DIAGNOSIS — S233XXA Sprain of ligaments of thoracic spine, initial encounter: Secondary | ICD-10-CM | POA: Diagnosis not present

## 2019-03-09 DIAGNOSIS — S338XXA Sprain of other parts of lumbar spine and pelvis, initial encounter: Secondary | ICD-10-CM | POA: Diagnosis not present

## 2019-03-14 ENCOUNTER — Other Ambulatory Visit: Payer: Self-pay

## 2019-03-14 ENCOUNTER — Other Ambulatory Visit (INDEPENDENT_AMBULATORY_CARE_PROVIDER_SITE_OTHER): Payer: Self-pay

## 2019-03-14 DIAGNOSIS — G35 Multiple sclerosis: Secondary | ICD-10-CM | POA: Diagnosis not present

## 2019-03-14 DIAGNOSIS — Z79899 Other long term (current) drug therapy: Secondary | ICD-10-CM | POA: Diagnosis not present

## 2019-03-14 DIAGNOSIS — Z0289 Encounter for other administrative examinations: Secondary | ICD-10-CM

## 2019-03-15 ENCOUNTER — Telehealth: Payer: Self-pay | Admitting: *Deleted

## 2019-03-15 LAB — HEPATIC FUNCTION PANEL
ALT: 21 IU/L (ref 0–32)
AST: 20 IU/L (ref 0–40)
Albumin: 4.4 g/dL (ref 3.8–4.8)
Alkaline Phosphatase: 89 IU/L (ref 39–117)
Bilirubin Total: 0.3 mg/dL (ref 0.0–1.2)
Bilirubin, Direct: 0.16 mg/dL (ref 0.00–0.40)
Total Protein: 6.6 g/dL (ref 6.0–8.5)

## 2019-03-15 NOTE — Telephone Encounter (Signed)
-----   Message from Britt Bottom, MD sent at 03/15/2019  1:35 PM EDT ----- Please let the patient know that the lab work is fine.

## 2019-03-15 NOTE — Telephone Encounter (Signed)
Called and spoke with pt about lab results per Dr. Sater note. She verbalized understanding.  

## 2019-03-21 ENCOUNTER — Other Ambulatory Visit: Payer: Self-pay | Admitting: *Deleted

## 2019-03-21 DIAGNOSIS — Z79899 Other long term (current) drug therapy: Secondary | ICD-10-CM

## 2019-03-21 DIAGNOSIS — G35 Multiple sclerosis: Secondary | ICD-10-CM

## 2019-03-24 DIAGNOSIS — S134XXA Sprain of ligaments of cervical spine, initial encounter: Secondary | ICD-10-CM | POA: Diagnosis not present

## 2019-03-24 DIAGNOSIS — R42 Dizziness and giddiness: Secondary | ICD-10-CM | POA: Diagnosis not present

## 2019-03-24 DIAGNOSIS — S233XXA Sprain of ligaments of thoracic spine, initial encounter: Secondary | ICD-10-CM | POA: Diagnosis not present

## 2019-03-24 DIAGNOSIS — S338XXA Sprain of other parts of lumbar spine and pelvis, initial encounter: Secondary | ICD-10-CM | POA: Diagnosis not present

## 2019-03-29 DIAGNOSIS — R471 Dysarthria and anarthria: Secondary | ICD-10-CM | POA: Diagnosis not present

## 2019-03-29 DIAGNOSIS — Z6828 Body mass index (BMI) 28.0-28.9, adult: Secondary | ICD-10-CM | POA: Diagnosis not present

## 2019-03-29 DIAGNOSIS — E663 Overweight: Secondary | ICD-10-CM | POA: Diagnosis not present

## 2019-03-29 DIAGNOSIS — F419 Anxiety disorder, unspecified: Secondary | ICD-10-CM | POA: Diagnosis not present

## 2019-03-29 DIAGNOSIS — Z0001 Encounter for general adult medical examination with abnormal findings: Secondary | ICD-10-CM | POA: Diagnosis not present

## 2019-03-29 DIAGNOSIS — G8191 Hemiplegia, unspecified affecting right dominant side: Secondary | ICD-10-CM | POA: Diagnosis not present

## 2019-03-29 DIAGNOSIS — R2689 Other abnormalities of gait and mobility: Secondary | ICD-10-CM | POA: Diagnosis not present

## 2019-03-29 DIAGNOSIS — H814 Vertigo of central origin: Secondary | ICD-10-CM | POA: Diagnosis not present

## 2019-04-07 ENCOUNTER — Telehealth: Payer: Self-pay

## 2019-04-07 NOTE — Telephone Encounter (Signed)
Hilda Blades, we sent L[employer] form beginning of October, did they receive this?

## 2019-04-07 NOTE — Telephone Encounter (Signed)
Asencion Partridge from [place] called stating they need Dr. Garth Bigness last office note faxed to them so they can extend her claim.  Fax number [phone] or [phone]

## 2019-04-07 NOTE — Telephone Encounter (Signed)
Allie from [place] called and stated that she would like to know exactly what limitations/restrictions does that patient have. Please advise

## 2019-04-08 ENCOUNTER — Telehealth: Payer: Self-pay | Admitting: Neurology

## 2019-04-08 NOTE — Telephone Encounter (Signed)
Ally from [place] called stating that she is needing clarification on the restrictions for the pt. Please advise. P# [phone] F# [phone]

## 2019-04-11 NOTE — Telephone Encounter (Signed)
Received further paperwork via fax to complete. Provided to MD to review/complete.

## 2019-04-11 NOTE — Telephone Encounter (Signed)
I have a copy of the [employer] form I will refax ed it today.

## 2019-04-12 NOTE — Telephone Encounter (Signed)
Gave completed/signed forms back to medical records to process for pt.   

## 2019-04-14 ENCOUNTER — Other Ambulatory Visit: Payer: Self-pay

## 2019-04-18 ENCOUNTER — Other Ambulatory Visit: Payer: Self-pay

## 2019-04-18 ENCOUNTER — Other Ambulatory Visit (INDEPENDENT_AMBULATORY_CARE_PROVIDER_SITE_OTHER): Payer: Self-pay

## 2019-04-18 DIAGNOSIS — R42 Dizziness and giddiness: Secondary | ICD-10-CM | POA: Diagnosis not present

## 2019-04-18 DIAGNOSIS — Z0289 Encounter for other administrative examinations: Secondary | ICD-10-CM

## 2019-04-18 DIAGNOSIS — G35 Multiple sclerosis: Secondary | ICD-10-CM | POA: Diagnosis not present

## 2019-04-18 DIAGNOSIS — Z79899 Other long term (current) drug therapy: Secondary | ICD-10-CM | POA: Diagnosis not present

## 2019-04-18 DIAGNOSIS — S233XXA Sprain of ligaments of thoracic spine, initial encounter: Secondary | ICD-10-CM | POA: Diagnosis not present

## 2019-04-18 DIAGNOSIS — S134XXA Sprain of ligaments of cervical spine, initial encounter: Secondary | ICD-10-CM | POA: Diagnosis not present

## 2019-04-18 DIAGNOSIS — S338XXA Sprain of other parts of lumbar spine and pelvis, initial encounter: Secondary | ICD-10-CM | POA: Diagnosis not present

## 2019-04-19 ENCOUNTER — Telehealth: Payer: Self-pay | Admitting: *Deleted

## 2019-04-19 LAB — HEPATIC FUNCTION PANEL
ALT: 29 IU/L (ref 0–32)
AST: 27 IU/L (ref 0–40)
Albumin: 4.4 g/dL (ref 3.8–4.8)
Alkaline Phosphatase: 93 IU/L (ref 39–117)
Bilirubin Total: 0.5 mg/dL (ref 0.0–1.2)
Bilirubin, Direct: 0.33 mg/dL (ref 0.00–0.40)
Total Protein: 6.8 g/dL (ref 6.0–8.5)

## 2019-04-19 NOTE — Telephone Encounter (Signed)
Spoke with patient and informed her that the lab work is fine. Patient verbalized understanding, appreciation.  

## 2019-04-25 ENCOUNTER — Encounter: Payer: Self-pay | Admitting: Neurology

## 2019-04-25 ENCOUNTER — Ambulatory Visit: Payer: BC Managed Care – PPO | Admitting: Neurology

## 2019-04-25 ENCOUNTER — Other Ambulatory Visit: Payer: Self-pay

## 2019-04-25 VITALS — BP 124/82 | HR 87 | Temp 97.5°F | Ht 66.0 in | Wt 180.3 lb

## 2019-04-25 DIAGNOSIS — R269 Unspecified abnormalities of gait and mobility: Secondary | ICD-10-CM

## 2019-04-25 DIAGNOSIS — F419 Anxiety disorder, unspecified: Secondary | ICD-10-CM

## 2019-04-25 DIAGNOSIS — G35 Multiple sclerosis: Secondary | ICD-10-CM | POA: Diagnosis not present

## 2019-04-25 DIAGNOSIS — R5383 Other fatigue: Secondary | ICD-10-CM

## 2019-04-25 DIAGNOSIS — M542 Cervicalgia: Secondary | ICD-10-CM

## 2019-04-25 DIAGNOSIS — F09 Unspecified mental disorder due to known physiological condition: Secondary | ICD-10-CM

## 2019-04-25 DIAGNOSIS — R200 Anesthesia of skin: Secondary | ICD-10-CM | POA: Diagnosis not present

## 2019-04-25 DIAGNOSIS — R39198 Other difficulties with micturition: Secondary | ICD-10-CM

## 2019-04-25 MED ORDER — GABAPENTIN 300 MG PO CAPS: 300.0000 mg | ORAL_CAPSULE | Freq: Three times a day (TID) | ORAL | 11 refills | Status: DC

## 2019-04-25 NOTE — Progress Notes (Signed)
[facility]  PATIENT: Lindsey Phelps DOB: Nov 09, 1980  REFERRING DOCTOR OR PCP:  [employer] SOURCE: Patient, notes from Dr. Gerarda Fraction, laboratory results.  _________________________________   HISTORICAL  CHIEF COMPLAINT:  Chief Complaint  Patient presents with   Follow-up    here for 6 month f.u on MS    Multiple Sclerosis    On [name] pt reports since starting the aubagio increased diarrhea, hair loss, arm/leg tingling, hand numbness, dizziness, anxiety memory loss and decreased balance    HISTORY OF PRESENT ILLNESS:  Lindsey Phelps is a 38 y.o. woman with relapsing remiting MS with multiple tumefactive lesions.  Update 04/25/2019: She has been on [name] for about 5 months now for relapsing remitting MS.  She had a little bit of hair loss and some itching in her legs > arms.   She had a rash after a tattoo (never happened with other ones).   She has had mild diarrhea with eating.   She has hair loss but it seems to getting better.   She notes a little more tingling and a stinging sensation at times.   Hands seem more numb if she uses the hands, worse on her right.   She has more pain in her toes.    Gait is a little wobbly with right leg a little slower than her left.   She has urinary urgency.     She feels STM and anxiety are worse.   She has not always remembered what she has done a little earlier in the day.   She notes she tenses up at times.    She is on phentemine --- the whole dose made hr jittery so she takes just 1/2 pill.   She feels it does help focus a little bit  The occipital pain improved after the injections.   She still has some headache behind the eyes and in her temples  Update 12/14/2018: She switched from Tysabri to [name] and is tolerating it well.   LFTs have been fine.  She is noting continued poor gait and some gait ataxia.   The right leg is numb at times and clumsier than the left.   She notes reduced right grip (she is right  handed) and mild right leg weakness.     She is noting more urinary urgency and frequency.  She has had some visual changes and depth perception seems off.    She has trouble with cognitive tasks, especially learning, processing speed and focus (easily gets distracted and gets off task), verbal fluency, spelling.     She also has fatigue that worsens as the day goes on.  She cannot process when two people talk at once or there are distractions.   Cognitive and fatigue issues occur at work and at home.    She also has some anxiety.   Valium helps her fall asleep.  Buspar helped her anxiety a little bit  The occipital neuralgia neck and headache pain improved after the splenius capitus/occipitla nerve injection in May.  She works as a Scientist, water quality at [employer].   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    However, due to poor balance and slurred speech, she is having more difficulty doing her job as a Scientist, water quality.    She notes that her symptoms worsen the longer she stands.   She is unable to sit on the job and unable to  take breaks at her discretion.   She also notes cognitive issues at work and has had trouble adjusting to a new register system  Update 10/21/2018: She is on Tysabri.  She initially tolerated it well but over the last 4 infusions she will experience more pain in the right occipital region and temple.    She also notes pressure in her right eye.   There is a squeezing sensation.   The last infusion was associated with pain starting during the infusion.   Last year, she just had headaches intermittently.   She started Tysabri in March, 2019.  She would like to stop Tysabri due to the side effects and not to go on any disease modifying therapy.  I had a conversation with her about the possibility that she has an aggressive MS due to the MRI changes that were occurring in late 2018 in early 2019 with notable tumefactive  lesions.  She appears to have done well on Tysabri.  She was most interested in no therapy but will consider [name].  If the COVID endemic improves Mavenclad might be something that she would be more interested in due to the short course and long duration of efficacy.  I gave her information on both treatments.  Physically she is doing about the same.  Her gait is off balance.  Strength is doing ok.  She does not note major numbness.  Her slurred speech resolved for the most part.  There is some urinary frequency but no recent incontinence..  She also notes more cognitive fog and more mood irritability over the last several months as well.  She also has fatigue. \ She is reporting a headache starting in the right occiput and radiating behind the right eye.  This is similar to headache in the past that responded to injections.  Her mom had optic neuritis about 20 years ago and some MRI changes initially felt to possibly be MS but she was never placed on any disease modifying therapy.  Update 04/22/2018: She feels her MS has been mostly stable.  She is on Tysabri.  She tolerates it well.  Her next infusion will be tomorrow.  The last JCV antibody titer was 0.17 (-) on 11/20/2017.  Her last MRI was 03/03/2018 and showed a stable number of lesions.  Some were reduced in size.  None of them enhanced.  Gait is doing well.   She gets rare jerks while walking but these have not worsened.   No new numbness or weakness.    Slurred speech resolved (occasioanl mild if tired).   She has urinary urgency and rare urge incontinence.   She has occasioanl word finding difficulty, also much better.  Neck pain and headaches improved after the splenius capitus TPI.   HA also better after getting her new glasses.      She took care of a cat with rabies but did not get bit (other family did and got treated -- no one had clinical rabies).  That was early September.     Update 03/08/2018: She is on Tysabri and tolerates it  well.    The last JCV Ab titer was 0.17 negative on 11/20/2017.    She reports constant dizziness with eye pain and blurriness, R > left, for the past 8 days.      She also has had right occipital tenderness x 6-8 weeks sometimes feeling like an icepick. Last Wednesday, she was in bed and suddenly in the right leg and she  had pins/needles sensation in the right hand.   She also noted worse visual disturbance bilaterally.     Sometimes swallowing seems difficult.    She gets a lot of nasal congestion.    MRI does not show sinusitis.     She was taking care of a cat with rabies and family got the rabies shots but she did not.    She was not scratched or bitten.   She last touched the cat 01/31/2018.     She went to the ED 03/03/2018 and an MRI was performed.   I personally reviewed the images.  There were no new lesions and the MRI is essentially unchanged compared to 11/10/2017 and improved compared to earlier ones..     IMPRESSION: 1. No acute intracranial process. 2. Stable number and stable to decreased size of supratentorial white matter lesions most compatible with chronic demyelination (tumefactive component). No enhancement. No parenchymal brain volume loss for age.  UPDATE 11/20/2017: She tolerated the first two infusions well but has noted pressure in her sinuses and altered since the thirs infusion.    She is also reporting more pain in her legs and muscle cramps the last month.    She also has a stabbing pain in her chest similar to how she felt with pneumonia.   She continues to note word finding errors and it seems to be more fluctuating.     She had a 40 minute episode of palpitations/tachycardia and increased anxiety a few weeks ago.    She still notes some numbness in her mouth but it is different.    IMPRESSION: This MRI of the brain with and without contrast shows the following: 1.    Multiple T2/flair hyperintense foci in the juxtacortical and periventricular white matter in a pattern  and configuration consistent with chronic demyelinating plaque associated with multiple sclerosis.  When compared to the prior study 07/28/2017, lesions that enhanced on the previous study no longer do so.  There are no new lesions.  The previous MRI showed severe edema associated with most of the foci,  consistent with tumefactive MS. 2.    There is a normal enhancement pattern and there are no acute findings.  Update 08/06/2017: Since the last visit, her cerebrospinal fluid was borderline with 3 oligoclonal bands. MRI of the spinal cord did not show any additional lesions. There was no evidence of tumor on chest/abdomen/pelvic CT scan.     Additionally  she saw Dr. Venetia Maxon in neurosurgery. He also ran her case by neuroradiology and neuro-oncology. After the steroids, there was an improved appearance on the MRI. There were no new lesions.   I discussed with body that the likelihood that she has tumefactive MS is probably higher than the likelihood of a CNS lymphoma or other disorder.   Therefore an option would be to hold off on a brain biopsy and do several months treatment and then reimage. On re-imaging, if there is worsening, we would need to reconsider a brain biopsy.  Clinically, her speech is better.  Balance is better.    She notes positional numbness in her hands, worse with tasks like holding a phone.  Sometimes she wakes up with the numbness.    It then improves.      On Buspar, anxiety improved but she got headaches.    On 5 mg po bid, she is still getting headache but they are tolerable and her anxiety is still better.    Update 07/09/2017: She woke  up Friday morning with more aphasia and dysphagia.   She has had some difficulty with her gait but it was no worse over the last month.   She also has had more trouble with cognition and processing speed and executive functioning over the past month.    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    She has noted mild visual changes over time  but notes her glasses are old and no asymmetry or color vision changes.   She denies any bladder issues.    She feels much more tired over the past 3 months.     She sleeps ok if she takes valium at night.     She was admitted to [facility] last week when she presented with worsening gait and worsening aphasia. An MRI was performed. Besides the left frontal lesion noted on the previous MRI, she had 4 additional large edema tenderness lesions that enhanced, one in the left frontal lobe, 2 in the right frontal lobe and another in the right temporal lobe. He also has a couple of punctate T2/FLAIR hyperintense foci that are less specific areas the small lesions did not enhance.   She received 2 days of IV Solu-Medrol and was discharged yesterday.  Update 06/11/2017: I first saw her 03/31/2017.   Was concerned about her word finding difficulties and an MRI of the brain was performed. It showed a subacute left frontal stroke. There were a few other punctate T2/FLAIR hyperintense foci consistent with minimal chronic microvascular ischemic change.   I was most concerned about an embolic etiology. The carotid ultrasound was essentially normal. The cardiac event monitor was essentially normal.  A TEE showed a normal ejection fraction of 60-65 percent.   There was no evidence of a patent foramen ovale or other right-to-left shunt. The study was felt to be normal.  She is on aspirin, metoprolol and Crestor.  Here to the previous visit, she feels her speech is not much  Better abd she still has some aphasia and feels she needs to concentrate more with her speech.  She feels her right arm is still clumsy.   She has noted she spells words wrong a lot and her typing is slowed with more errors.   She still has some slurring. There are no new symptoms.l;  She also has some anxiety and BuSpar was started.   It helped some but may have contributed to dizziness and headaches so the dose was decreased to 5 mg twice a  day.    Headaches are less frequent in general (she was having more at last visit).   She will get occasional sharp pains.    She used to smoke but none in 4 years (quit after dad had lung cancer). No OCP (had hysterectomy)  She has FH of strokes and MI's .    From 03/31/2017: [facility] the [facility] Lindsey [facility]hel[facility] neurolog[facility] consultation regarding her numbness, word finding difficulties, balance issues and other neurologic symptoms.  She is a 38 yo woman who had an episode with chest pain and bilateral arm numbness on 01/27/2016 and was taken to the ED (workup was normal).  She who noted numbness in her jaw at night about 5 months ago when starting to fall asleep.  In August 2018, she had another episode of chest tightness and arm numbness and went to an ED.   Since then, she has noted more symptoms such as slurred  speech, verbal fluency issues, leg weakness and balance issues.    These symptoms mostly come and go for 10 - 30 minutes and now occur daily.   They occur randomly and in any position (sitting, standing, walking).    She also notes vision is blurry and she has episodes of worsening blurry vision, also lasting 10-30 minutes.   She has vertigo that is mild but constant.   It intensifies for a few minutes at a time sometimes while she is walking.      She has had panic attacks since 2016 after her brother was in a serious accident but she feels current symptoms are different.  She takes valium prn, just a few a month.  Many years ago, she was on Celexa.   She continues to note episodes of palpitations and tachycardia.   Metoprolol was started with benefit.     I reviewed the laboratory results from 02/21/2017. B12 is low-normal at 289. TSH, CBC and CMP are normal. LDL  and triglycerides were slightly elevated.   01/25/2017 CTA of the chest and CXR were normal.   She had a 30 day event monitor that showed intermittent tachycardia by her reports.   An Echo ws reportedly normal.    REVIEW OF SYSTEMS: Constitutional: No fevers, chills, sweats, or change in appetite.   She has mild sleep onset insomnia Eyes: No visual changes, double vision, eye pain Ear, nose and throat: No hearing loss, ear pain, nasal congestion, sore throat Cardiovascular: No chest pain.  Has intermittent tachycardia.  Respiratory: No shortness of breath at rest or with exertion.   No wheezes GastrointestinaI: No nausea, vomiting, diarrhea, abdominal pain, fecal incontinence Genitourinary: No dysuria, urinary retention or frequency.  No nocturia. Musculoskeletal: No neck pain, back pain Integumentary: No rash, pruritus, skin lesions Neurological: as above Psychiatric: No depression at this time.  H/o panic attacks. Endocrine: No palpitations, diaphoresis, change in appetite, change in weigh or increased thirst Hematologic/Lymphatic: No anemia, purpura, petechiae. Allergic/Immunologic: has some seasonal allergies  ALLERGIES: Allergies  Allergen Reactions   Darvocet [Propoxyphene N-Acetaminophen] Hives   Demerol Hives   Adhesive [Tape] Rash and Other (See Comments)    Including bandaids - takes skin off, peels    HOME MEDICATIONS:  Current Outpatient Medications:    cetirizine-pseudoephedrine (ZYRTEC-D) 5-120 MG tablet, Take 1 tablet by mouth 2 (two) times daily., Disp: , Rfl:    diazepam (VALIUM) 10 MG tablet, Take 10 mg by mouth at bedtime as needed for anxiety. , Disp: , Rfl:    diphenhydrAMINE HCl (BENADRYL PO), Take by mouth., Disp: , Rfl:    ibuprofen (ADVIL) 200 MG tablet, Take 200 mg by mouth every 6 (six) hours as needed., Disp: , Rfl:    phentermine 37.5 MG capsule, Take 1 capsule (37.5 mg total) by mouth every morning., Disp: 30 capsule, Rfl: 5   Teriflunomide ([name]) 14 MG TABS, Take by mouth., Disp: , Rfl:  No current facility-administered medications for this visit.   Facility-Administered Medications Ordered in Other Visits:      gadopentetate dimeglumine (MAGNEVIST) injection 18 mL, 18 mL, Intravenous, Once PRN, Lecretia Buczek A, MD   gadopentetate dimeglumine (MAGNEVIST) injection 18 mL, 18 mL, Intravenous, Once PRN, Latrisa Hellums, Pearletha Furl, MD  PAST MEDICAL HISTORY: Past Medical History:  Diagnosis Date   Anxiety    Cold    Heart palpitations    High cholesterol    History of IBS    Hypertension    Multiple sclerosis (HCC)  Stroke [facility](HCC)     PAST SURGICAL HISTORY: Past Surgical History:  Procedure Laterality Date   ABDOMINAL HYSTERECTOMY     BLADDER NECK SUSPENSION     CHOLECYSTECTOMY     COLONOSCOPY  01/23/2011   Procedure: COLONOSCOPY;  Surgeon: Malissa HippoNajeeb U Rehman, MD;  Location: [facility];  Service: Endoscopy;  Laterality: N/A;   LAPAROSCOPIC TUBAL LIGATION     TEE WITHOUT CARDIOVERSION N/A 05/05/2017   Procedure: TRANSESOPHAGEAL ECHOCARDIOGRAM (TEE);  Surgeon: Laqueta LindenKoneswaran, Suresh A, MD;  Location: [facility];  Service: Cardiovascular;  Laterality: N/A;    FAMILY HISTORY: Family History  Problem Relation Age of Onset   Diabetes Father    Hypertension Father    Colon polyps Father    Lung cancer Father    Hypertension Mother    Coronary artery disease Mother    COPD Mother     SOCIAL HISTORY:  Social History   Socioeconomic History   Marital status: Married    Spouse name: Not on file   Number of children: Not on file   Years of education: Not on file   Highest education level: Not on file  Occupational History   Not on file  Social Needs   Financial resource strain: Not on file   Food insecurity    Worry: Not on file    Inability: Not on file   Transportation needs    Medical: Not on file    Non-medical: Not on file  Tobacco Use   Smoking status: Former Smoker    Packs/day: 0.50    Years: 15.00    Pack years: 7.50    Types: Cigarettes    Quit date: 09/16/2012    Years since quitting: 6.6   Smokeless tobacco: Never Used  Substance and  Sexual Activity   Alcohol use: Yes    Alcohol/week: 1.0 standard drinks    Types: 1 Shots of liquor per week    Comment: every few months   Drug use: No   Sexual activity: Yes    Birth control/protection: Surgical  Lifestyle   Physical activity    Days per week: Not on file    Minutes per session: Not on file   Stress: Not on file  Relationships   Social connections    Talks on phone: Not on file    Gets together: Not on file    Attends religious service: Not on file    Active member of club or organization: Not on file    Attends meetings of clubs or organizations: Not on file    Relationship status: Not on file   Intimate partner violence    Fear of current or ex partner: Not on file    Emotionally abused: Not on file    Physically abused: Not on file    Forced sexual activity: Not on file  Other Topics Concern   Not on file  Social History Narrative   Not on file     PHYSICAL EXAM  Vitals:   04/25/19 1307  BP: 124/82  Pulse: 87  Temp: (!) 97.5 F (36.4 C)  TempSrc: Temporal  Weight: 180 lb 5 oz (81.8 kg)  Height: 5\' 6"  (1.676 m)    Body mass index is 29.1 kg/m.   General: The patient is well-developed and well-nourished and in no acute distress.   Has only mild right occipital tenderness now, much better than last visit. Neurologic Exam  Mental status: She is alert and oriented x 3 at the  time of the examination. The patient has apparent normal recent and remote memory, with an apparently normal attention span and concentration ability.  Speech is mildly slurred and she had trouble coming up with the right words a few times.   There is a general slowness of her thought process.     Cranial nerves:   Color vision is symmetric.  Extraocular movements are full.  Facial strength and sensation was normal.  Trapezius strength was normal.  The tongue is midline, and the patient has symmetric elevation of the soft palate. No obvious hearing deficits are  noted.  Motor:  Muscle bulk is normal.   Tone is normal.  Strength is 4+/5 in the APB muscle of the right hand.  Other: She has Tinel signs over the right wrist and there is a Phalen sign on the right.  Sensory: Intact sensation to touch and vibration in the arms and legs.   Coordination: Cerebellar testing reveals good finger-nose-finger and left heel-to-shin.  She has reduced right heel-to-shin. Gait and station: Station is normal.  The gait is slightly wide.  Tandem gait is wide.  Romberg is negative.   Reflexes: Deep tendon reflexes are symmetric and slightly increased in the arms, increased at the knees with crossed adductors.  No ankle clonus.        DIAGNOSTIC DATA (LABS, IMAGING, TESTING) - I reviewed patient records, labs, notes, testing and imaging myself where available.  Lab Results  Component Value Date   WBC 8.7 10/21/2018   HGB 13.3 10/21/2018   HCT 38.4 10/21/2018   MCV 87 10/21/2018   PLT 255 10/21/2018      Component Value Date/Time   NA 136 03/03/2018 1736   K 3.4 (L) 03/03/2018 1736   CL 105 03/03/2018 1736   CO2 23 03/03/2018 1736   GLUCOSE 85 03/03/2018 1736   BUN 12 03/03/2018 1736   CREATININE 0.70 03/03/2018 1740   CALCIUM 8.8 (L) 03/03/2018 1736   PROT 6.8 04/18/2019 1313   ALBUMIN 4.4 04/18/2019 1313   AST 27 04/18/2019 1313   ALT 29 04/18/2019 1313   ALKPHOS 93 04/18/2019 1313   BILITOT 0.5 04/18/2019 1313   GFRNONAA >60 03/03/2018 1736   GFRAA >60 03/03/2018 1736       ASSESSMENT AND PLAN  1. Numbness   2. Multiple sclerosis (HCC)   3. Gait disturbance   4. Anxiety   5. Urinary dysfunction   6. Other fatigue   7. Cognitive deficit secondary to MS (HCC)   8. Neck pain     1.    For MS continue [name] for now but we will check an MRI of the brain to determine if any subclinical progression.  If present consider Mayzent or Zeposia or Ocrevus.   2.   NCV/EMG for numbness --- probable right CTS but also has some numbness in the  toes.  [name] has been associated with polyneuropathy risk 3.   Continue phentermine 1/2 pill .    4.   Due to her physical impairments, cognitive issues and fatigue, she is unable to do her job.   5.   Gabapentin for dysesthesias.   If anxiety worsens, consider retrial of Buspar 6.   Return to see me in 6 months or sooner if there are new or worsening neurologic symptoms.     Shantinique Picazo A. Epimenio Foot, MD, PhD, FAAN Certified in Neurology, Clinical Neurophysiology, Sleep Medicine, Pain Medicine and Neuroimaging Director, [facility]  [facility] [address] [place], [place] [address] [phone]

## 2019-04-26 ENCOUNTER — Telehealth: Payer: Self-pay | Admitting: Neurology

## 2019-04-26 NOTE — Telephone Encounter (Addendum)
Pt states she needs the notes from yesterdays appointment with Dr Felecia Shelling and also a note stating when she is to return to see Dr Felecia Shelling again.  Pt is asking that this be faxed to a Job Accomodation Specialist: Ottis Stain @ [phone]  Pt states this is needed for submission by Dec 4th, please call pt if there are questions on this request

## 2019-05-04 ENCOUNTER — Encounter: Payer: Self-pay | Admitting: *Deleted

## 2019-05-04 NOTE — Telephone Encounter (Signed)
Spoke with Dr. Felecia Shelling. He would like to see pt back in 6 months. She has EMG/NCS on 06/21/2019. I wrote letter and printed and included OV note. Gave back to medical records to send.

## 2019-05-10 ENCOUNTER — Other Ambulatory Visit: Payer: Self-pay

## 2019-05-10 ENCOUNTER — Ambulatory Visit (INDEPENDENT_AMBULATORY_CARE_PROVIDER_SITE_OTHER): Payer: BC Managed Care – PPO

## 2019-05-10 DIAGNOSIS — G35 Multiple sclerosis: Secondary | ICD-10-CM | POA: Diagnosis not present

## 2019-05-10 MED ORDER — GADOBENATE DIMEGLUMINE 529 MG/ML IV SOLN
17.0000 mL | Freq: Once | INTRAVENOUS | Status: AC | PRN
  Administered 2019-05-10: 15:00:00 17 mL via INTRAVENOUS

## 2019-05-11 ENCOUNTER — Telehealth: Payer: Self-pay | Admitting: *Deleted

## 2019-05-11 NOTE — Telephone Encounter (Signed)
-----   Message from Britt Bottom, MD sent at 05/10/2019  6:09 PM EST ----- Please let the patient know that the MRI looks good - no new lesions

## 2019-06-14 ENCOUNTER — Telehealth: Payer: Self-pay | Admitting: Neurology

## 2019-06-14 NOTE — Telephone Encounter (Signed)
Pt has asked that Dr Epimenio Foot be made aware that she has heard from the disability approval office and she was told she is getting set up for a mental evaluation by one of their doctors.  This is FYI, pt has not asked for a call back

## 2019-06-21 ENCOUNTER — Encounter: Payer: BC Managed Care – PPO | Admitting: Neurology

## 2019-07-19 ENCOUNTER — Encounter: Payer: Managed Care, Other (non HMO) | Admitting: Neurology

## 2019-07-19 ENCOUNTER — Ambulatory Visit (INDEPENDENT_AMBULATORY_CARE_PROVIDER_SITE_OTHER): Payer: Managed Care, Other (non HMO) | Admitting: Neurology

## 2019-07-19 ENCOUNTER — Other Ambulatory Visit: Payer: Self-pay

## 2019-07-19 ENCOUNTER — Telehealth: Payer: Self-pay | Admitting: Neurology

## 2019-07-19 DIAGNOSIS — R200 Anesthesia of skin: Secondary | ICD-10-CM

## 2019-07-19 DIAGNOSIS — G35 Multiple sclerosis: Secondary | ICD-10-CM

## 2019-07-19 DIAGNOSIS — Z0289 Encounter for other administrative examinations: Secondary | ICD-10-CM

## 2019-07-19 MED ORDER — AUBAGIO 14 MG PO TABS: 14.0000 mg | ORAL_TABLET | Freq: Every day | ORAL | 11 refills | Status: DC

## 2019-07-19 NOTE — Addendum Note (Signed)
Addended by: Eilene Ghazi L on: 07/19/2019 11:30 AM   Modules accepted: Orders

## 2019-07-19 NOTE — Telephone Encounter (Signed)
E-scribed refill to [facility] as requested.  

## 2019-07-19 NOTE — Telephone Encounter (Signed)
Pt here today for NCV/EMG. Her insurance has changed to [place]. A copy made in chart. She only has 1 refill left of [name]. Needs a call back as there may be a problem with the specialty pharmacy [phone]

## 2019-07-19 NOTE — Progress Notes (Signed)
Full Name: Lindsey Phelps Gender: Female MRN #: [id number] Date of Birth: 17-Jan-1981    Visit Date: 07/19/2019 07:25 Age: 39 Years Examining Physician: Arlice Colt, MD  Referring Physician: Arlice Colt, MD    History: Lindsey Phelps is a 39 year old woman with multiple sclerosis on [name] who is reporting numbness and tingling in the hands, right greater than left and in the feet.  Strength was 4/5 in the right APB muscle.  She had mildly reduced sensation to touch in the toes.  Nerve conduction studies: The right median, left median, right ulnar, right peroneal, left peroneal and right tibial motor responses had normal distal latencies, amplitudes and conduction velocities.  The right tibial and ulnar F-wave responses had normal latencies.  The right sural, left superficial peroneal, right median, left median and right ulnar sensory responses had normal peak latencies and amplitudes.  The right superficial peroneal sensory response had a normal peak latency but a mildly reduced amplitude.  Electromyography: Needle EMG of selected muscles of the right arm was performed.  All muscles tested had normal motor unit action potentials and recruitment.  There was no abnormal spontaneous activity.  Needle EMG of selected muscles of the right leg was performed.  There were a few polyphasic motor units in the gastrocnemius and abductor hallucis muscles but other muscles tested had normal motor unit action potentials.  Recruitment was normal in all of the muscles tested.  There was no abnormal spontaneous activity.  Impression: This NCV/EMG study shows the following: 1.  There is no evidence of a polyneuropathy or mononeuropathy. 2.   There was no evidence of a significant radiculopathy though a minimal right S1 chronic radiculopathy cannot be ruled out.  Lindsey Lord A. Felecia Shelling, MD, PhD, FAAN Certified in Neurology, Clinical Neurophysiology, Sleep Medicine, Pain Medicine and  Neuroimaging Director, [facility] [address], [place], [place] [address] [phone]          [facility]    Nerve / Sites Muscle Latency Ref. Amplitude Ref. Rel Amp Segments Distance Velocity Ref. Area    ms ms mV mV %  cm m/s m/s mVms  R Median - APB     Wrist APB 2.9 ?4.4 11.1 ?4.0 100 Wrist - APB 7   39.4     Upper arm APB 6.7  10.5  94.2 Upper arm - Wrist 22 58 ?49 37.8  L Median - APB     Wrist APB 2.6 ?4.4 11.6 ?4.0 100 Wrist - APB 7   33.3     Upper arm APB 6.3  11.0  95.1 Upper arm - Wrist 21 57 ?49 30.9  R Ulnar - ADM     Wrist ADM 2.6 ?3.3 12.2 ?6.0 100 Wrist - ADM 7   38.7     B.Elbow ADM 5.9  11.5  94.2 B.Elbow - Wrist 21 64 ?49 38.4     A.Elbow ADM 7.4  11.4  99.4 A.Elbow - B.Elbow 10 67 ?49 38.5         A.Elbow - Wrist      R Peroneal - EDB     Ankle EDB 4.4 ?6.5 6.9 ?2.0 100 Ankle - EDB 9   22.2     Fib head EDB 10.7  6.6  94.9 Fib head - Ankle 30 48 ?44 23.4     Pop fossa EDB 12.9  6.5  98 Pop fossa - Fib head 10  44 ?44 23.7         Pop fossa - Ankle      L Peroneal - EDB     Ankle EDB 4.6 ?6.5 7.5 ?2.0 100 Ankle - EDB 9   23.2     Fib head EDB 11.0  7.3  97.6 Fib head - Ankle 30 47 ?44 25.0     Pop fossa EDB 13.1  6.8  93.3 Pop fossa - Fib head 10 48 ?44 23.6         Pop fossa - Ankle      R Tibial - AH     Ankle AH 4.4 ?5.8 13.9 ?4.0 100 Ankle - AH 9   37.5     Pop fossa AH 13.4  11.9  85.9 Pop fossa - Ankle 37 41 ?41 38.4                   SNC    Nerve / Sites Rec. Site Peak Lat Ref.  Amp Ref. Segments Distance Peak Diff Ref.    ms ms V V  cm ms ms  R Sural - Ankle (Calf)     Calf Ankle 3.6 ?4.4 7 ?6 Calf - Ankle 14    R Superficial peroneal - Ankle     Lat leg Ankle 4.0 ?4.4 3 ?6 Lat leg - Ankle 14    L Superficial peroneal - Ankle     Lat leg Ankle 3.9 ?4.4 6 ?6 Lat leg - Ankle 14    R Median, Ulnar - Transcarpal comparison     Median Palm Wrist 1.9 ?2.2 92  ?35 Median Palm - Wrist 8       Ulnar Palm Wrist 1.9 ?2.2 17 ?12 Ulnar Palm - Wrist 8          Median Palm - Ulnar Palm  -0.0 ?0.4  R Median - Orthodromic (Dig II, Mid palm)     Dig II Wrist 2.8 ?3.4 19 ?10 Dig II - Wrist 13    L Median - Orthodromic (Dig II, Mid palm)     Dig II Wrist 2.7 ?3.4 21 ?10 Dig II - Wrist 13    R Ulnar - Orthodromic, (Dig V, Mid palm)     Dig V Wrist 2.6 ?3.1 9 ?5 Dig V - Wrist 75                     F  Wave    Nerve F Lat Ref.   ms ms  R Tibial - AH 51.3 ?56.0  R Ulnar - ADM 25.2 ?32.0         EMG Summary Table    Spontaneous MUAP Recruitment  Muscle IA Fib PSW Fasc Other Amp Dur. Poly Pattern  R. Deltoid Normal None None None _______ Normal Normal Normal Normal  R. Biceps brachii Normal None None None _______ Normal Normal Normal Normal  R. Triceps brachii Normal None None None _______ Normal Normal Normal Normal  R. Extensor digitorum communis Normal None None None _______ Normal Normal Normal Normal  R. First dorsal interosseous Normal None None None _______ Normal Normal Normal Normal  R. Abductor pollicis brevis Normal None None None _______ Normal Normal Normal Normal  R. Vastus medialis Normal None None None _______ Normal Normal Normal Normal  R. Tibialis anterior Normal None None None _______ Normal Normal Normal Normal  R. Peroneus longus Normal None None None _______ Normal Normal Normal Normal  R. Gastrocnemius (Medial head) Normal  None None None _______ Normal Normal 1+ Normal  R. Abductor hallucis Normal None None None _______ Normal Normal 1+ Normal  R. Iliopsoas Normal None None None _______ Normal Normal Normal Normal  R. Gluteus medius Normal None None None _______ Normal Normal Normal Normal

## 2019-07-19 NOTE — Telephone Encounter (Signed)
Patient called back to verify pharmacy is now [facility]   Pharmacy# [phone]  [phone]

## 2019-07-19 NOTE — Telephone Encounter (Signed)
Dr. Epimenio Foot and I spoke. He asked that pt call me to let me know what specialty pharmacy we need to forward refill to. Will wait to hear back from pt.

## 2019-07-25 DIAGNOSIS — Z0271 Encounter for disability determination: Secondary | ICD-10-CM

## 2019-07-27 ENCOUNTER — Telehealth: Payer: Self-pay | Admitting: *Deleted

## 2019-07-27 NOTE — Telephone Encounter (Signed)
Submitted PA [name] 14mg  tab on CMM. Key:[id number] on 07/26/2019. Waiting on determination from [facility].

## 2019-08-01 NOTE — Telephone Encounter (Signed)
Received fax from [facility] that PA needed for [name]. We already submitted PA on 07/26/19 on CMM and it is showing status of pending still. I called [employer] at [phone]. They are needing more info before determination is made for PA. Connected to PA department to provide further info. I spoke with Hilda Lias. States PA determination will be made today, no further info needed. PA Case ID: [id number].

## 2019-08-01 NOTE — Telephone Encounter (Addendum)
[id number]:[id number];DUKGUR:[id number];Review Type:Prior Auth;Coverage Start Date:07/26/2019;Coverage End Date:07/31/2020;  Faxed notice of approval to [facility] at [phone]. Received fax confirmation.

## 2019-10-05 IMAGING — US US CAROTID DUPLEX BILAT
1 series · 13 of 24 positions shown · non-contrast
Comparison: None available

CLINICAL DATA: Stroke symptoms, syncope

EXAM:
BILATERAL CAROTID DUPLEX ULTRASOUND
TECHNIQUE: Gray scale imaging, color Doppler and duplex ultrasound were
performed of bilateral carotid and vertebral arteries in the neck.

[Series 1: us carotid duplex bilat · 0.06mm/px · 13 of 69 slices shown]
[im 1/69]
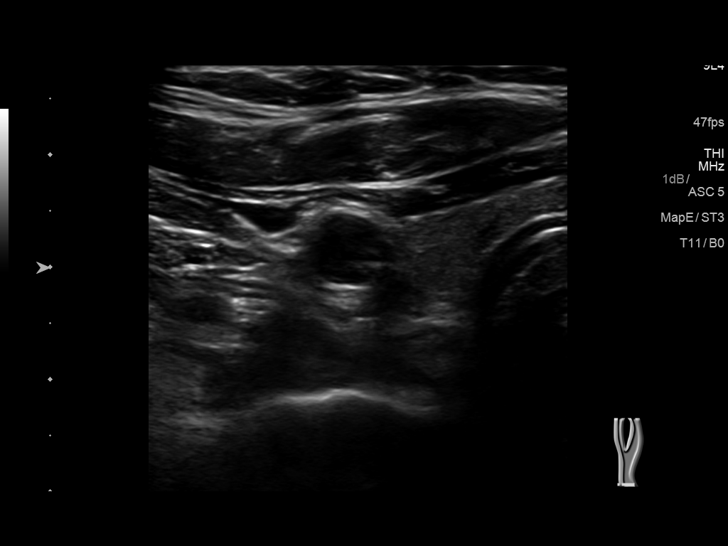
[im 6/69]
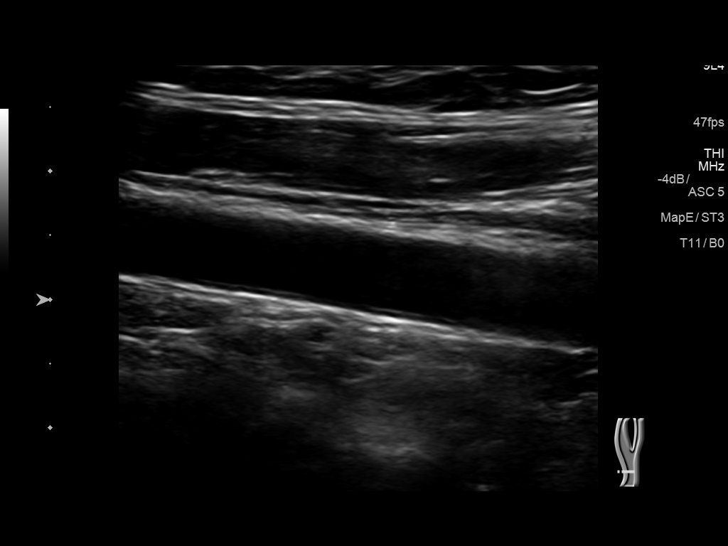
[im 12/69]
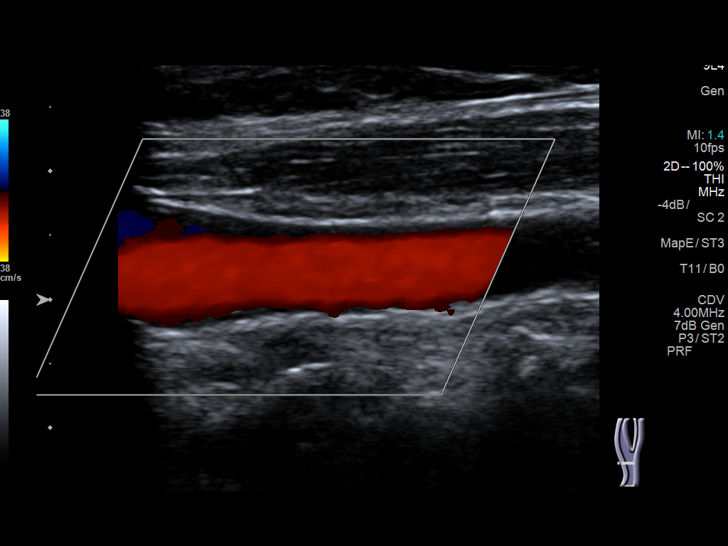
[im 18/69]
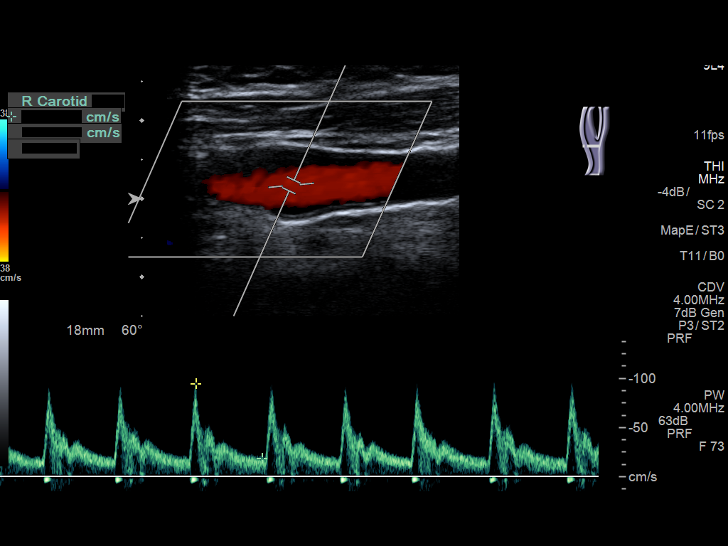
[im 24/69]
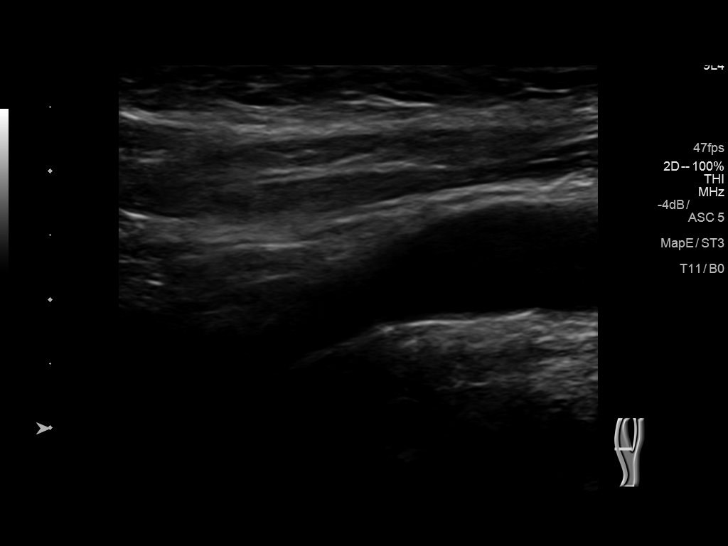
[im 30/69]
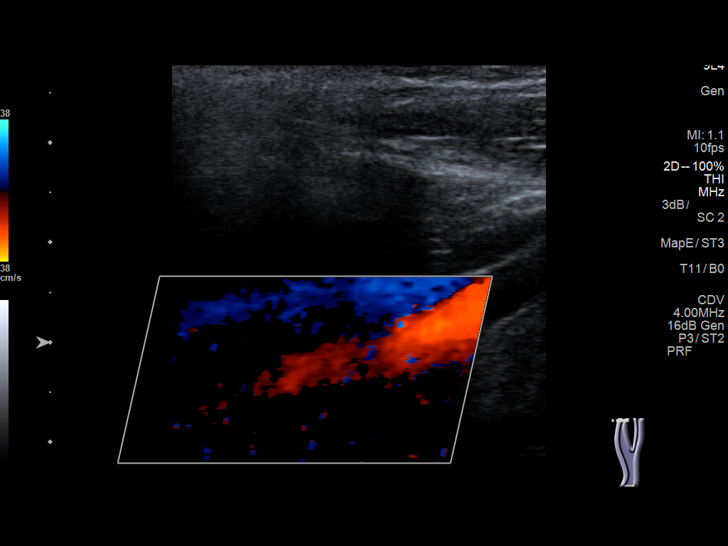
[im 36/69]
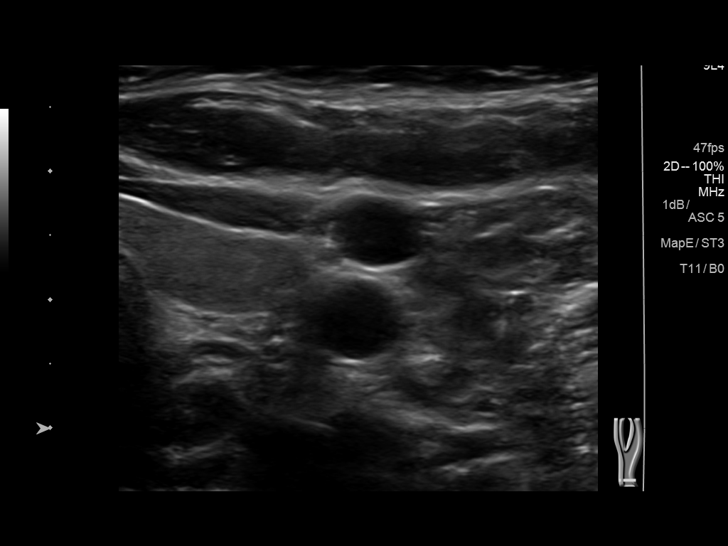
[im 39/69]
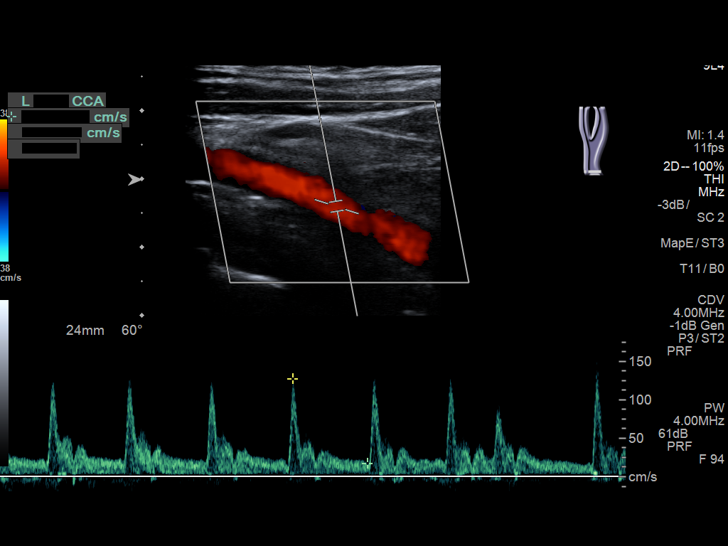
[im 45/69]
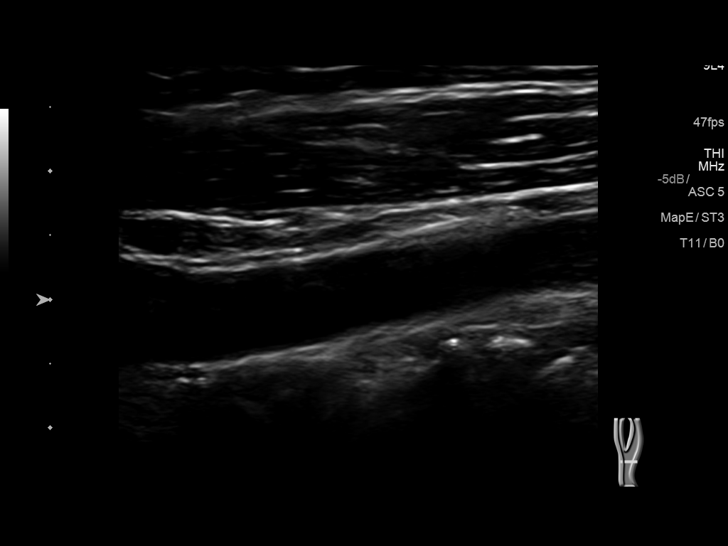
[im 51/69]
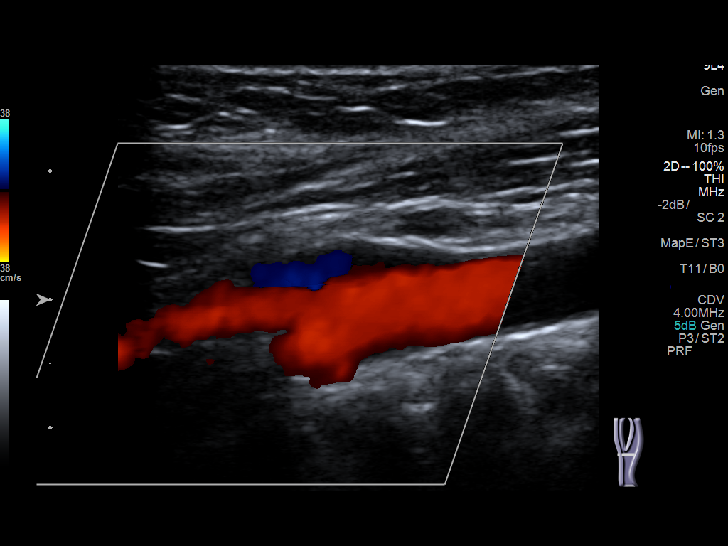
[im 57/69]
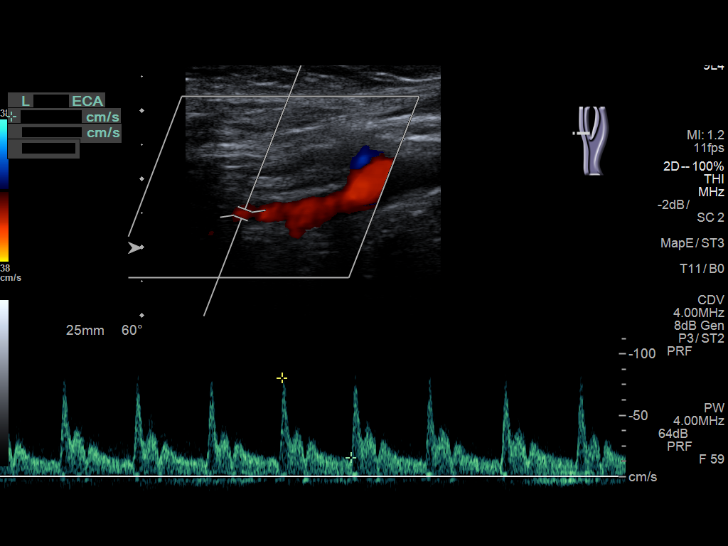
[im 63/69]
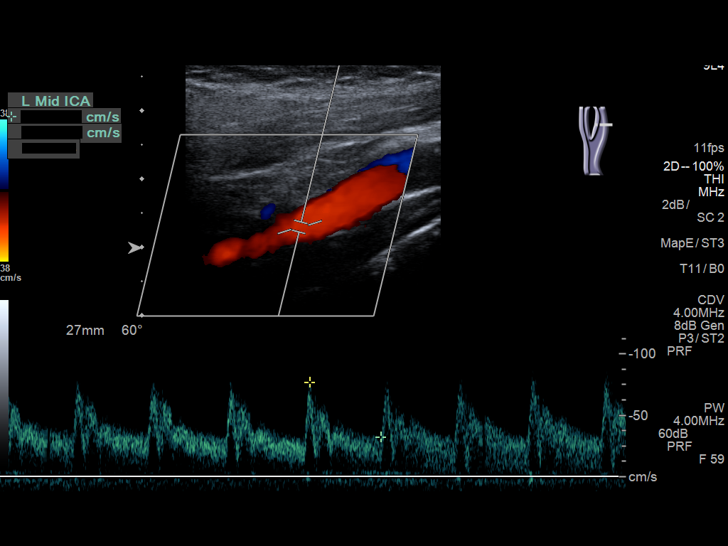
[im 69/69]
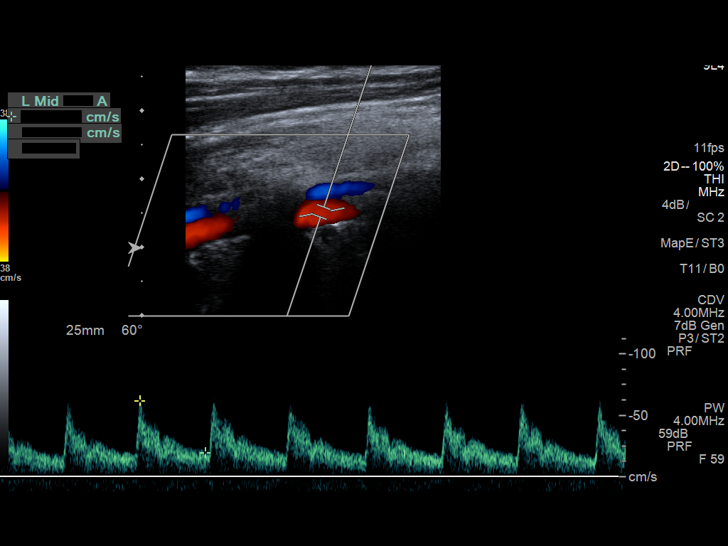

[13 of 24 positions shown; findings below may reference images not displayed]

FINDINGS: Criteria: Quantification of carotid stenosis is based on velocity
parameters that correlate the residual internal carotid diameter
with NASCET-based stenosis levels, using the diameter of the distal
internal carotid lumen as the denominator for stenosis measurement.

The following velocity measurements were obtained:

RIGHT

ICA:  98/34 cm/sec

CCA:  124/24 cm/sec

SYSTOLIC ICA/CCA RATIO:

DIASTOLIC ICA/CCA RATIO:

ECA:  80 cm/sec

LEFT

ICA:  77/32 cm/sec

CCA:  117/22 cm/sec

SYSTOLIC ICA/CCA RATIO:

DIASTOLIC ICA/CCA RATIO:

ECA:  80 cm/sec

RIGHT CAROTID ARTERY: No significant carotid atherosclerosis. No
hemodynamically significant right ICA stenosis, velocity elevation,
or turbulent flow. Degree of narrowing less than 50%.

RIGHT VERTEBRAL ARTERY:  Antegrade

LEFT CAROTID ARTERY: No significant carotid atherosclerosis. No
hemodynamically significant left ICA stenosis, velocity elevation,
or turbulent flow.

LEFT VERTEBRAL ARTERY:  Antegrade
IMPRESSION: No significant carotid atherosclerosis or ICA stenosis by
ultrasound.

Patent antegrade vertebral flow bilaterally

## 2019-10-18 DIAGNOSIS — Z0289 Encounter for other administrative examinations: Secondary | ICD-10-CM

## 2019-10-24 ENCOUNTER — Other Ambulatory Visit: Payer: Self-pay

## 2019-10-24 ENCOUNTER — Encounter: Payer: Self-pay | Admitting: Neurology

## 2019-10-24 ENCOUNTER — Ambulatory Visit: Payer: Managed Care, Other (non HMO) | Admitting: Neurology

## 2019-10-24 VITALS — BP 110/78 | HR 98 | Ht 66.0 in | Wt 181.5 lb

## 2019-10-24 DIAGNOSIS — G35 Multiple sclerosis: Secondary | ICD-10-CM

## 2019-10-24 DIAGNOSIS — F419 Anxiety disorder, unspecified: Secondary | ICD-10-CM

## 2019-10-24 DIAGNOSIS — R269 Unspecified abnormalities of gait and mobility: Secondary | ICD-10-CM

## 2019-10-24 DIAGNOSIS — R200 Anesthesia of skin: Secondary | ICD-10-CM | POA: Diagnosis not present

## 2019-10-24 DIAGNOSIS — F09 Unspecified mental disorder due to known physiological condition: Secondary | ICD-10-CM

## 2019-10-24 DIAGNOSIS — Z79899 Other long term (current) drug therapy: Secondary | ICD-10-CM | POA: Insufficient documentation

## 2019-10-24 MED ORDER — AMPHETAMINE-DEXTROAMPHET ER 10 MG PO CP24: 10.0000 mg | ORAL_CAPSULE | Freq: Every day | ORAL | 0 refills | Status: DC

## 2019-10-24 MED ORDER — CYCLOBENZAPRINE HCL 5 MG PO TABS: ORAL_TABLET | ORAL | 5 refills | Status: AC

## 2019-10-24 NOTE — Progress Notes (Signed)
[facility]  PATIENT: Lindsey Phelps DOB: 22-Feb-1981  REFERRING DOCTOR OR PCP:  Elfredia Nevins SOURCE: Patient, notes from Dr. Sherwood Gambler, laboratory results.  _________________________________   HISTORICAL  CHIEF COMPLAINT:  Chief Complaint  Patient presents with  . Follow-up    RM 12, alone. Last seen 04/25/2019. Gabapentin  po qhs made her too drowsy. She stopped this. States she could not function on this. She tried a flexeril of her brothers and this seemed to help more. Wants to discuss possibly adding this to her meds.  . Multiple Sclerosis    On [name].     HISTORY OF PRESENT ILLNESS:  Lindsey Phelps is a 39 y.o. woman with relapsing remiting MS with multiple tumefactive lesions.  Update 10/24/2019: She is on [name] and tolerates it well.   She is noting tingling and uncomfortable dysesthesias in her feet and hands.   The the right foot is worse and becomes painful at night.     Both hands have symmetric symptoms.   She tries a Flexeril and did better.    Gabapentin makes her too sleepy.  NCV/EMG 07/19/2019 did not show any neuropathy or evidence of radiculopathy.  She has residual mild right leg weakness affecting gait at times.   Speech is usually good though some word finding difficulties at times and reduced STM/focus.   As an example, she needs reminders to take hr medications.   She takes 1/2 phentermine daily with some benefit.     Valium at night helps her sleep.     She has some anxiety and agoraphobia.     Marland Kitchen   Update 04/25/2019: She has been on [name] for about 5 months now for relapsing remitting MS.  She had a little bit of hair loss and some itching in her legs > arms.   She had a rash after a tattoo (never happened with other ones).   She has had mild diarrhea with eating.   She has hair loss but it seems to getting better.   She notes a little more tingling and a stinging sensation at times.   Hands seem more numb if she uses the  hands, worse on her right.   She has more pain in her toes.    Gait is a little wobbly with right leg a little slower than her left.   She has urinary urgency.     She feels STM and anxiety are worse.   She has not always remembered what she has done a little earlier in the day.   She notes she tenses up at times.    She is on phentemine --- the whole dose made hr jittery so she takes just 1/2 pill.   She feels it does help focus a little bit  The occipital pain improved after the injections.   She still has some headache behind the eyes and in her temples  Update 12/14/2018: She switched from Tysabri to [name] and is tolerating it well.   LFTs have been fine.  She is noting continued poor gait and some gait ataxia.   The right leg is numb at times and clumsier than the left.   She notes reduced right grip (she is right handed) and mild right leg weakness.     She is noting more urinary urgency and frequency.  She has had some visual changes and depth perception seems off.    She has trouble with cognitive tasks, especially learning, processing speed and focus (  easily gets distracted and gets off task), verbal fluency, spelling.     She also has fatigue that worsens as the day goes on.  She cannot process when two people talk at once or there are distractions.   Cognitive and fatigue issues occur at work and at home.    She also has some anxiety.   Valium helps her fall asleep.  Buspar helped her anxiety a little bit  The occipital neuralgia neck and headache pain improved after the splenius capitus/occipitla nerve injection in May.  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    However, due to poor balance and slurred speech, she is having more difficulty doing her job as a Conservation officer, nature.    She notes that her symptoms worsen the longer she stands.   She is unable to sit on the job and  unable to take breaks at her discretion.   She also notes cognitive issues at work and has had trouble adjusting to a new register system  Update 10/21/2018: She is on Tysabri.  She initially tolerated it well but over the last 4 infusions she will experience more pain in the right occipital region and temple.    She also notes pressure in her right eye.   There is a squeezing sensation.   The last infusion was associated with pain starting during the infusion.   Last year, she just had headaches intermittently.   She started Tysabri in March, 2019.  She would like to stop Tysabri due to the side effects and not to go on any disease modifying therapy.  I had a conversation with her about the possibility that she has an aggressive MS due to the MRI changes that were occurring in late 2018 in early 2019 with notable tumefactive lesions.  She appears to have done well on Tysabri.  She was most interested in no therapy but will consider [name].  If the COVID endemic improves Mavenclad might be something that she would be more interested in due to the short course and long duration of efficacy.  I gave her information on both treatments.  Physically she is doing about the same.  Her gait is off balance.  Strength is doing ok.  She does not note major numbness.  Her slurred speech resolved for the most part.  There is some urinary frequency but no recent incontinence..  She also notes more cognitive fog and more mood irritability over the last several months as well.  She also has fatigue. \ She is reporting a headache starting in the right occiput and radiating behind the right eye.  This is similar to headache in the past that responded to injections.  Her mom had optic neuritis about 20 years ago and some MRI changes initially felt to possibly be MS but she was never placed on any disease modifying therapy.  Update 04/22/2018: She feels her MS has been mostly stable.  She is on Tysabri.  She tolerates it  well.  Her next infusion will be tomorrow.  The last JCV antibody titer was 0.17 (-) on 11/20/2017.  Her last MRI was 03/03/2018 and showed a stable number of lesions.  Some were reduced in size.  None of them enhanced.  Gait is doing well.   She gets rare jerks while walking but these have not worsened.   No new numbness  or weakness.    Slurred speech resolved (occasioanl mild if tired).   She has urinary urgency and rare urge incontinence.   She has occasioanl word finding difficulty, also much better.  Neck pain and headaches improved after the splenius capitus TPI.   HA also better after getting her new glasses.      She took care of a cat with rabies but did not get bit (other family did and got treated -- no one had clinical rabies).  That was early September.     Update 03/08/2018: She is on Tysabri and tolerates it well.    The last JCV Ab titer was 0.17 negative on 11/20/2017.    She reports constant dizziness with eye pain and blurriness, R > left, for the past 8 days.      She also has had right occipital tenderness x 6-8 weeks sometimes feeling like an icepick. Last Wednesday, she was in bed and suddenly in the right leg and she had pins/needles sensation in the right hand.   She also noted worse visual disturbance bilaterally.     Sometimes swallowing seems difficult.    She gets a lot of nasal congestion.    MRI does not show sinusitis.     She was taking care of a cat with rabies and family got the rabies shots but she did not.    She was not scratched or bitten.   She last touched the cat 01/31/2018.     She went to the ED 03/03/2018 and an MRI was performed.   I personally reviewed the images.  There were no new lesions and the MRI is essentially unchanged compared to 11/10/2017 and improved compared to earlier ones..     IMPRESSION: 1. No acute intracranial process. 2. Stable number and stable to decreased size of supratentorial white matter lesions most compatible with chronic  demyelination (tumefactive component). No enhancement. No parenchymal brain volume loss for age.  UPDATE 11/20/2017: She tolerated the first two infusions well but has noted pressure in her sinuses and altered since the thirs infusion.    She is also reporting more pain in her legs and muscle cramps the last month.    She also has a stabbing pain in her chest similar to how she felt with pneumonia.   She continues to note word finding errors and it seems to be more fluctuating.     She had a 40 minute episode of palpitations/tachycardia and increased anxiety a few weeks ago.    She still notes some numbness in her mouth but it is different.    IMPRESSION: This MRI of the brain with and without contrast shows the following: 1.    Multiple T2/flair hyperintense foci in the juxtacortical and periventricular white matter in a pattern and configuration consistent with chronic demyelinating plaque associated with multiple sclerosis.  When compared to the prior study 07/28/2017, lesions that enhanced on the previous study no longer do so.  There are no new lesions.  The previous MRI showed severe edema associated with most of the foci,  consistent with tumefactive MS. 2.    There is a normal enhancement pattern and there are no acute findings.  Update 08/06/2017: Since the last visit, her cerebrospinal fluid was borderline with 3 oligoclonal bands. MRI of the spinal cord did not show any additional lesions. There was no evidence of tumor on chest/abdomen/pelvic CT scan.     Additionally  she saw Dr. Venetia Maxon in neurosurgery. He also  ran her case by neuroradiology and neuro-oncology. After the steroids, there was an improved appearance on the MRI. There were no new lesions.   I discussed with body that the likelihood that she has tumefactive MS is probably higher than the likelihood of a CNS lymphoma or other disorder.   Therefore an option would be to hold off on a brain biopsy and do several months treatment and  then reimage. On re-imaging, if there is worsening, we would need to reconsider a brain biopsy.  Clinically, her speech is better.  Balance is better.    She notes positional numbness in her hands, worse with tasks like holding a phone.  Sometimes she wakes up with the numbness.    It then improves.      On Buspar, anxiety improved but she got headaches.    On 5 mg po bid, she is still getting headache but they are tolerable and her anxiety is still better.    Update 07/09/2017: She woke up Friday morning with more aphasia and dysphagia.   She has had some difficulty with her gait but it was no worse over the last month.   She also has had more trouble with cognition and processing speed and executive functioning over the past month.    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    She has noted mild visual changes over time but notes her glasses are old and no asymmetry or color vision changes.   She denies any bladder issues.    She feels much more tired over the past 3 months.     She sleeps ok if she takes valium at night.     She was admitted to [facility] last week when she presented with worsening gait and worsening aphasia. An MRI was performed. Besides the left frontal lesion noted on the previous MRI, she had 4 additional large edema tenderness lesions that enhanced, one in the left frontal lobe, 2 in the right frontal lobe and another in the right temporal lobe. He also has a couple of punctate T2/FLAIR hyperintense foci that are less specific areas the small lesions did not enhance.   She received 2 days of IV Solu-Medrol and was discharged yesterday.  Update 06/11/2017: I first saw her 03/31/2017.   Was concerned about her word finding difficulties and an MRI of the brain was performed. It showed a subacute left frontal stroke. There were a few other punctate T2/FLAIR hyperintense foci consistent with minimal chronic microvascular ischemic change.   I was most concerned about  an embolic etiology. The carotid ultrasound was essentially normal. The cardiac event monitor was essentially normal.  A TEE showed a normal ejection fraction of 60-65 percent.   There was no evidence of a patent foramen ovale or other right-to-left shunt. The study was felt to be normal.  She is on aspirin, metoprolol and Crestor.  Here to the previous visit, she feels her speech is not much  Better abd she still has some aphasia and feels she needs to concentrate more with her speech.  She feels her right arm is still clumsy.   She has noted she spells words wrong a lot and her typing is slowed with more errors.   She still has some slurring. There are no new symptoms.l;  She also has some anxiety and BuSpar was started.   It helped some but may have contributed to dizziness and headaches so the dose was decreased to 5 mg  twice a day.    Headaches are less frequent in general (she was having more at last visit).   She will get occasional sharp pains.    She used to smoke but none in 4 years (quit after dad had lung cancer). No OCP (had hysterectomy)  She has FH of strokes and MI's .    From 03/31/2017: I had the pleasure seeing you patient, Lindsey Phelps, at [facility] for neurologic consultation regarding her numbness, word finding difficulties, balance issues and other neurologic symptoms.  She is a 39 yo woman who had an episode with chest pain and bilateral arm numbness on 01/27/2016 and was taken to the ED (workup was normal).  She who noted numbness in her jaw at night about 5 months ago when starting to fall asleep.  In August 2018, she had another episode of chest tightness and arm numbness and went to an ED.   Since then, she has noted more symptoms such as slurred speech, verbal fluency issues, leg weakness and balance issues.    These symptoms mostly come and go for 10 - 30 minutes and now occur daily.   They occur randomly and in any position (sitting, standing,  walking).    She also notes vision is blurry and she has episodes of worsening blurry vision, also lasting 10-30 minutes.   She has vertigo that is mild but constant.   It intensifies for a few minutes at a time sometimes while she is walking.      She has had panic attacks since 2016 after her brother was in a serious accident but she feels current symptoms are different.  She takes valium prn, just a few a month.  Many years ago, she was on Celexa.   She continues to note episodes of palpitations and tachycardia.   Metoprolol was started with benefit.     I reviewed the laboratory results from 02/21/2017. B12 is low-normal at 289. TSH, CBC and CMP are normal. LDL  and triglycerides were slightly elevated.   01/25/2017 CTA of the chest and CXR were normal.   She had a 30 day event monitor that showed intermittent tachycardia by her reports.  An Echo ws reportedly normal.    REVIEW OF SYSTEMS: Constitutional: No fevers, chills, sweats, or change in appetite.   She has mild sleep onset insomnia Eyes: No visual changes, double vision, eye pain Ear, nose and throat: No hearing loss, ear pain, nasal congestion, sore throat Cardiovascular: No chest pain.  Has intermittent tachycardia.  Respiratory: No shortness of breath at rest or with exertion.   No wheezes GastrointestinaI: No nausea, vomiting, diarrhea, abdominal pain, fecal incontinence Genitourinary: No dysuria, urinary retention or frequency.  No nocturia. Musculoskeletal: No neck pain, back pain Integumentary: No rash, pruritus, skin lesions Neurological: as above Psychiatric: No depression at this time.  H/o panic attacks. Endocrine: No palpitations, diaphoresis, change in appetite, change in weigh or increased thirst Hematologic/Lymphatic: No anemia, purpura, petechiae. Allergic/Immunologic: has some seasonal allergies  ALLERGIES: Allergies  Allergen Reactions  . Darvocet [Propoxyphene N-Acetaminophen] Hives  . Demerol Hives  .  Adhesive [Tape] Rash and Other (See Comments)    Including bandaids - takes skin off, peels welts    HOME MEDICATIONS:  Current Outpatient Medications:  .  [name] 14 MG TABS, Take 14 mg by mouth daily., Disp: 30 tablet, Rfl: 11 .  cetirizine-pseudoephedrine (ZYRTEC-D) 5-120 MG tablet, Take 1 tablet by mouth 2 (two) times daily., Disp: , Rfl:  .  diazepam (VALIUM) 10 MG tablet, Take 10 mg by mouth at bedtime as needed for anxiety. , Disp: , Rfl:  .  diphenhydrAMINE HCl (BENADRYL PO), Take by mouth., Disp: , Rfl:  .  ibuprofen (ADVIL) 200 MG tablet, Take 200 mg by mouth every 6 (six) hours as needed., Disp: , Rfl:  .  phentermine 37.5 MG capsule, Take 1 capsule (37.5 mg total) by mouth every morning. (Patient taking differently: Take 37.5 mg by mouth every morning. Gets tablets--take 1/2 tab every morning), Disp: 30 capsule, Rfl: 5 .  amphetamine-dextroamphetamine (ADDERALL XR) 10 MG 24 hr capsule, Take 1 capsule (10 mg total) by mouth daily., Disp: 30 capsule, Rfl: 0 .  cyclobenzaprine (FLEXERIL) 5 MG tablet, Take up to 3 pills daily as needed, Disp: 90 tablet, Rfl: 5 No current facility-administered medications for this visit.  Facility-Administered Medications Ordered in Other Visits:  .  gadopentetate dimeglumine (MAGNEVIST) injection 18 mL, 18 mL, Intravenous, Once PRN, Nicolai Labonte A, MD .  gadopentetate dimeglumine (MAGNEVIST) injection 18 mL, 18 mL, Intravenous, Once PRN, Asheley Hellberg, Pearletha Furl, MD  PAST MEDICAL HISTORY: Past Medical History:  Diagnosis Date  . Anxiety   . Cold   . Heart palpitations   . High cholesterol   . History of IBS   . Hypertension   . Multiple sclerosis (HCC)   . Stroke [facility])     PAST SURGICAL HISTORY: Past Surgical History:  Procedure Laterality Date  . ABDOMINAL HYSTERECTOMY    . BLADDER NECK SUSPENSION    . CHOLECYSTECTOMY    . COLONOSCOPY  01/23/2011   Procedure: COLONOSCOPY;  Surgeon: Malissa Hippo, MD;  Location: [facility];  Service:  Endoscopy;  Laterality: N/A;  . LAPAROSCOPIC TUBAL LIGATION    . TEE WITHOUT CARDIOVERSION N/A 05/05/2017   Procedure: TRANSESOPHAGEAL ECHOCARDIOGRAM (TEE);  Surgeon: Laqueta Linden, MD;  Location: [facility];  Service: Cardiovascular;  Laterality: N/A;    FAMILY HISTORY: Family History  Problem Relation Age of Onset  . Diabetes Father   . Hypertension Father   . Colon polyps Father   . Lung cancer Father   . Hypertension Mother   . Coronary artery disease Mother   . COPD Mother     SOCIAL HISTORY:  Social History   Socioeconomic History  . Marital status: Married    Spouse name: Not on file  . Number of children: Not on file  . Years of education: Not on file  . Highest education level: Not on file  Occupational History  . Not on file  Tobacco Use  . Smoking status: Former Smoker    Packs/day: 0.50    Years: 15.00    Pack years: 7.50    Types: Cigarettes    Quit date: 09/16/2012    Years since quitting: 7.1  . Smokeless tobacco: Never Used  Substance and Sexual Activity  . Alcohol use: Yes    Alcohol/week: 1.0 standard drinks    Types: 1 Shots of liquor per week    Comment: every few months  . Drug use: No  . Sexual activity: Yes    Birth control/protection: Surgical  Other Topics Concern  . Not on file  Social History Narrative  . Not on file   Social Determinants of Health   Financial Resource Strain:   . Difficulty of Paying Living Expenses:   Food Insecurity:   . Worried About Programme researcher, broadcasting/film/video in the Last Year:   . Barista in  the Last Year:   Transportation Needs:   . Freight forwarder (Medical):   Marland Kitchen Lack of Transportation (Non-Medical):   Physical Activity:   . Days of Exercise per Week:   . Minutes of Exercise per Session:   Stress:   . Feeling of Stress :   Social Connections:   . Frequency of Communication with Friends and Family:   . Frequency of Social Gatherings with Friends and Family:   . Attends Religious  Services:   . Active Member of Clubs or Organizations:   . Attends Banker Meetings:   Marland Kitchen Marital Status:   Intimate Partner Violence:   . Fear of Current or Ex-Partner:   . Emotionally Abused:   Marland Kitchen Physically Abused:   . Sexually Abused:      PHYSICAL EXAM  Vitals:   10/24/19 1253  BP: 110/78  Pulse: 98  SpO2: 100%  Weight: 181 lb 8 oz (82.3 kg)  Height:  (1.676 m)    Body mass index is 29.29 kg/m.   General: The patient is well-developed and well-nourished and in no acute distress.   Has only mild right occipital tenderness now, much better than last visit. Neurologic Exam  Mental status: She is alert and oriented x 3 at the time of the examination. The patient has apparent normal recent and remote memory, with an apparently normal attention span and concentration ability.  She had no noticeable speech issue during today's visit.  Cranial nerves:   Color vision is symmetric.  Extraocular movements are full.  Facial strength was normal.  Trapezius strength was normal.  The tongue is midline, and the patient has symmetric elevation of the soft palate. No obvious hearing deficits are noted.  Motor:  Muscle bulk is normal.   Tone is normal.  Strength is 4+/5 in the APB muscle of the right hand and normal elsewhere..  Sensory: Intact sensation to touch and vibration in the arms and legs.   Coordination: Cerebellar testing reveals good finger-nose-finger and left heel-to-shin.  She has reduced right heel-to-shin.  Gait and station: Station is normal.  Her gait is mildly wide.  Tandem gait is wide..  Romberg is negative.   Reflexes: Deep tendon reflexes are symmetric and slightly increased in the arms, increased at the knees with crossed adductors.  No ankle clonus.        DIAGNOSTIC DATA (LABS, IMAGING, TESTING) - I reviewed patient records, labs, notes, testing and imaging myself where available.  Lab Results  Component Value Date   WBC 8.7 10/21/2018    HGB 13.3 10/21/2018   HCT 38.4 10/21/2018   MCV 87 10/21/2018   PLT 255 10/21/2018      Component Value Date/Time   NA 136 03/03/2018 1736   K 3.4 (L) 03/03/2018 1736   CL 105 03/03/2018 1736   CO2 23 03/03/2018 1736   GLUCOSE 85 03/03/2018 1736   BUN 12 03/03/2018 1736   CREATININE 0.70 03/03/2018 1740   CALCIUM 8.8 (L) 03/03/2018 1736   PROT 6.8 04/18/2019 1313   ALBUMIN 4.4 04/18/2019 1313   AST 27 04/18/2019 1313   ALT 29 04/18/2019 1313   ALKPHOS 93 04/18/2019 1313   BILITOT 0.5 04/18/2019 1313   GFRNONAA >60 03/03/2018 1736   GFRAA >60 03/03/2018 1736       ASSESSMENT AND PLAN  1. Multiple sclerosis (HCC)   2. Numbness   3. Gait disturbance   4. Cognitive deficit secondary to MS (HCC)   5.  High risk medication use   6. Anxiety     1.   Continue [name]     2.   Trial of Flexeril stop gabapentin for pain 3.   Change phentermine to Adderall XR 10  .    4.   Due to her physical impairments, cognitive issues and fatigue, she is unable to do her job.   5.   Return to see me in 6 months or sooner if there are new or worsening neurologic symptoms.   We can get MRI of the brain around time of next visit to assess for subclinical progression.     Phebe Dettmer A. Epimenio Foot, MD, PhD, FAAN Certified in Neurology, Clinical Neurophysiology, Sleep Medicine, Pain Medicine and Neuroimaging Director, [facility] at [facility]  [facility] [address] [place], [place] [address] [phone]

## 2019-10-25 LAB — CBC WITH DIFFERENTIAL/PLATELET
Basophils Absolute: 0.1 10*3/uL (ref 0.0–0.2)
Basos: 2 %
EOS (ABSOLUTE): 1.1 10*3/uL — ABNORMAL HIGH (ref 0.0–0.4)
Eos: 20 %
Hematocrit: 39.9 % (ref 34.0–46.6)
Hemoglobin: 13.3 g/dL (ref 11.1–15.9)
Immature Grans (Abs): 0 10*3/uL (ref 0.0–0.1)
Immature Granulocytes: 0 %
Lymphocytes Absolute: 2.1 10*3/uL (ref 0.7–3.1)
Lymphs: 36 %
MCH: 29.5 pg (ref 26.6–33.0)
MCHC: 33.3 g/dL (ref 31.5–35.7)
MCV: 89 fL (ref 79–97)
Monocytes Absolute: 0.5 10*3/uL (ref 0.1–0.9)
Monocytes: 8 %
Neutrophils Absolute: 2 10*3/uL (ref 1.4–7.0)
Neutrophils: 34 %
Platelets: 247 10*3/uL (ref 150–450)
RBC: 4.51 x10E6/uL (ref 3.77–5.28)
RDW: 12.3 % (ref 11.7–15.4)
WBC: 5.7 10*3/uL (ref 3.4–10.8)

## 2019-10-25 LAB — HEPATIC FUNCTION PANEL
ALT: 17 IU/L (ref 0–32)
AST: 23 IU/L (ref 0–40)
Albumin: 4.5 g/dL (ref 3.8–4.8)
Alkaline Phosphatase: 77 IU/L (ref 48–121)
Bilirubin Total: 0.4 mg/dL (ref 0.0–1.2)
Bilirubin, Direct: 0.23 mg/dL (ref 0.00–0.40)
Total Protein: 6.6 g/dL (ref 6.0–8.5)

## 2019-11-10 ENCOUNTER — Telehealth: Payer: Self-pay | Admitting: Neurology

## 2019-11-10 ENCOUNTER — Encounter: Payer: Self-pay | Admitting: *Deleted

## 2019-11-10 NOTE — Telephone Encounter (Signed)
Pt is asking for a letter to be excused from [employer] duty due to her anxiety and her cognitive issues.  Please call

## 2019-11-10 NOTE — Telephone Encounter (Signed)
Called pt. She would like letter release to mychart. This was done.

## 2019-12-14 IMAGING — MR MR HEAD WO/W CM
10 of 14 series · 34 of 48 positions shown · IV contrast (multihance)
Comparison: Head CT from earlier today.  Brain MRI 04/07/2017

CLINICAL DATA: Slurred speech since [REDACTED]

EXAM:
MRI HEAD WITHOUT AND WITH CONTRAST
TECHNIQUE: Multiplanar, multiecho pulse sequences of the brain and surrounding
structures were obtained without and with intravenous contrast.
CONTRAST:  18mL MULTIHANCE GADOBENATE DIMEGLUMINE 529 MG/ML IV SOLN

[Series 4: DWI · axial · 3.0mm · 0.82mm/px · z∈[-20,+126]mm · 4 of 50 slices shown (1 of 4)]
[im 1/50]
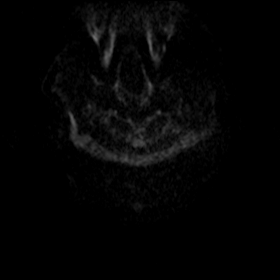
[im 17/50]
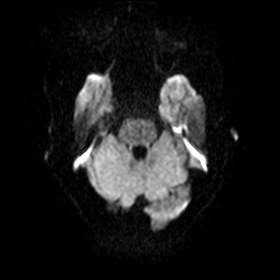
[im 33/50]
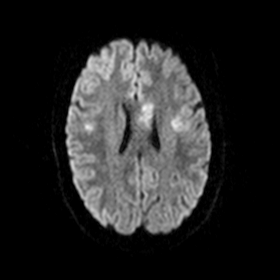
[im 50/50]
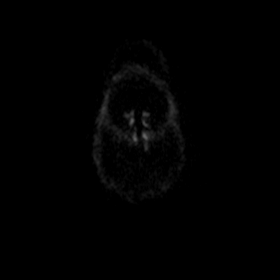

[Series 5: DWI · axial · 3.0mm · 0.82mm/px · z∈[-20,+126]mm · 4 of 50 slices shown (2 of 4)]
[im 1/50]
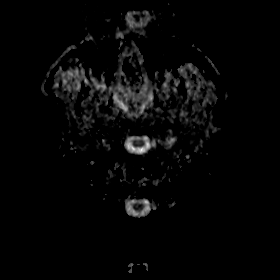
[im 17/50]
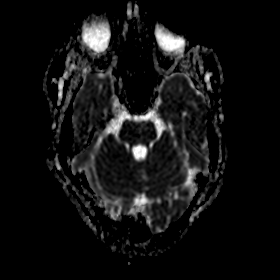
[im 33/50]
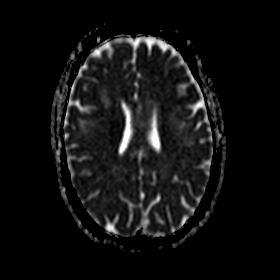
[im 50/50]
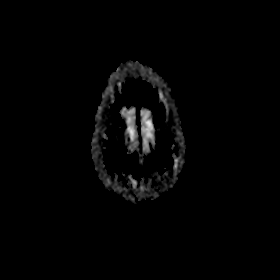

[Series 6: DWI · coronal · 5.0mm · 0.50mm/px · 3 of 34 slices shown (3 of 4)]
[im 1/34]
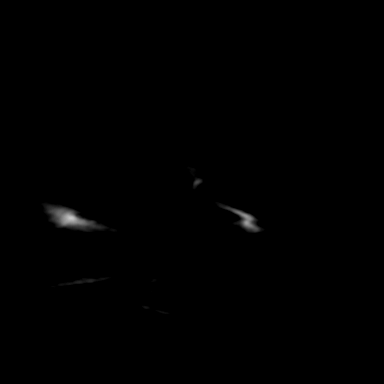
[im 17/34]
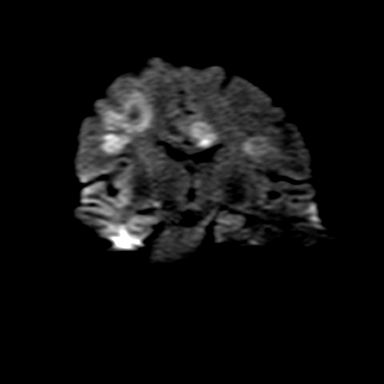
[im 34/34]
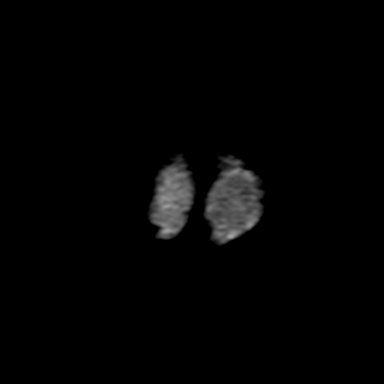

[Series 7: DWI · coronal · 5.0mm · 0.50mm/px · 3 of 34 slices shown (4 of 4)]
[im 1/34]
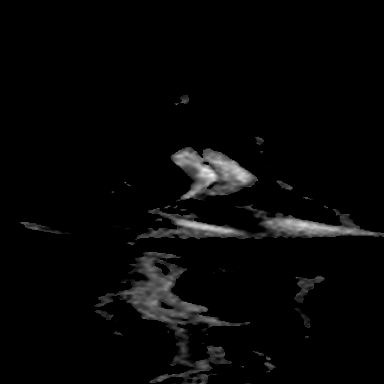
[im 17/34]
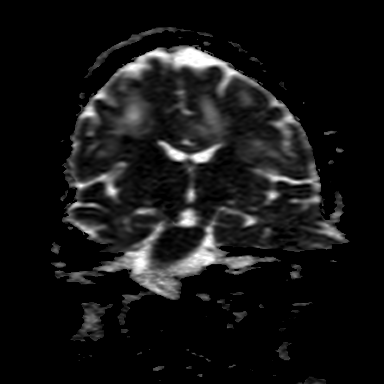
[im 34/34]
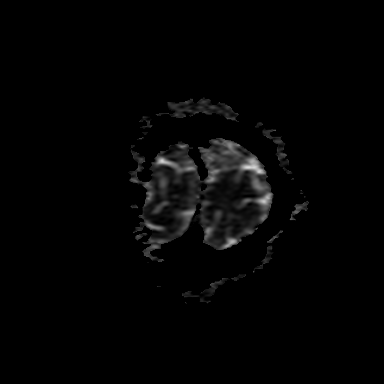

[Series 8: T2 · axial · 5.0mm · 0.75mm/px · z∈[-19,+123]mm · 2 of 23 slices shown (1 of 2)]
[im 1/23]
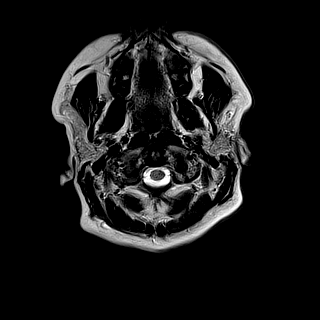
[im 23/23]
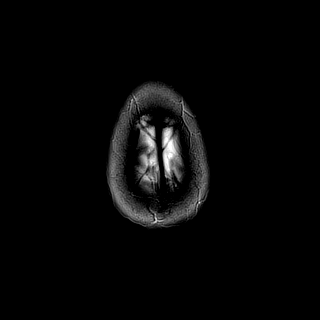

[Series 9: FLAIR · axial · 3.0mm · 0.94mm/px · z∈[-17,+121]mm · 4 of 47 slices shown]
[im 1/47]
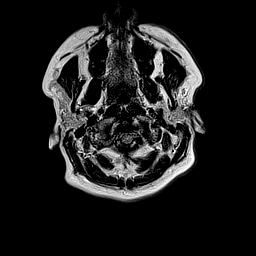
[im 16/47]
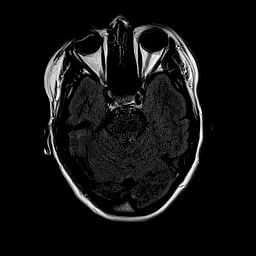
[im 31/47]
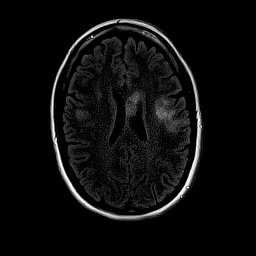
[im 47/47]
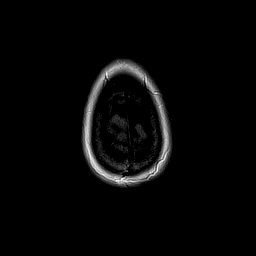

[Series 10: T1 · axial · 2.0mm · 0.47mm/px · z∈[-30,+157]mm · 8 of 95 slices shown (1 of 2)]
[im 1/95]
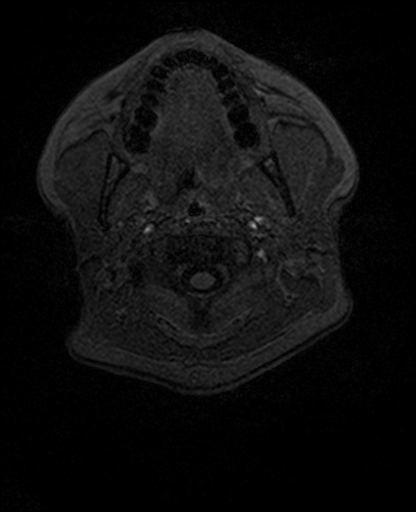
[im 14/95]
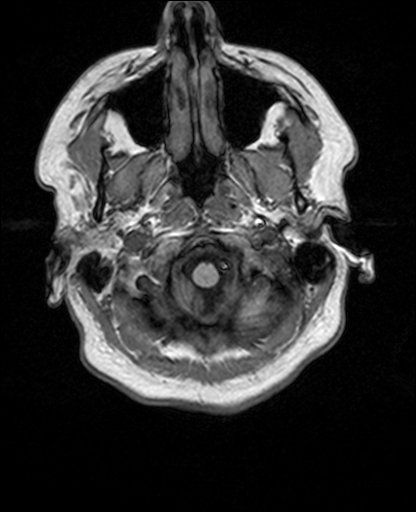
[im 27/95]
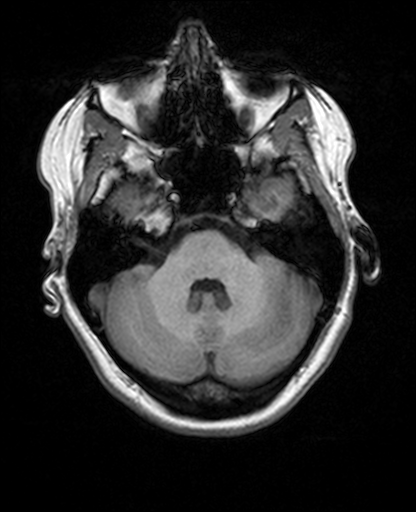
[im 41/95]
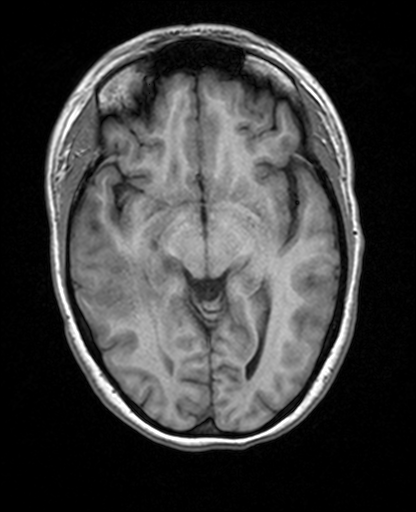
[im 54/95]
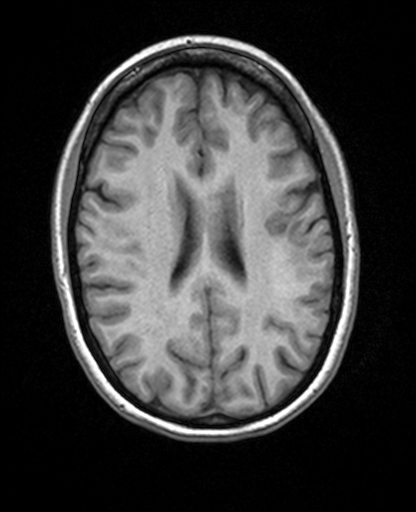
[im 68/95]
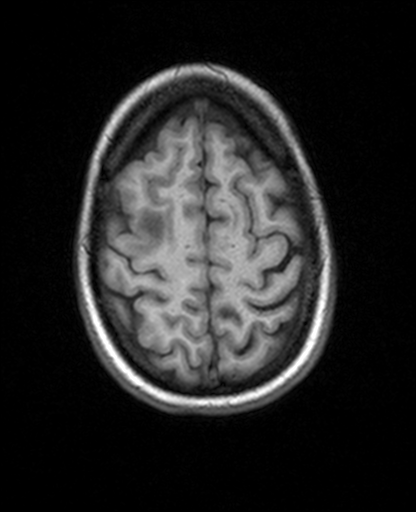
[im 81/95]
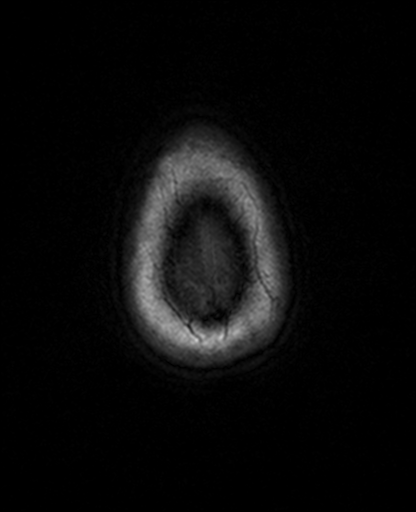
[im 95/95]
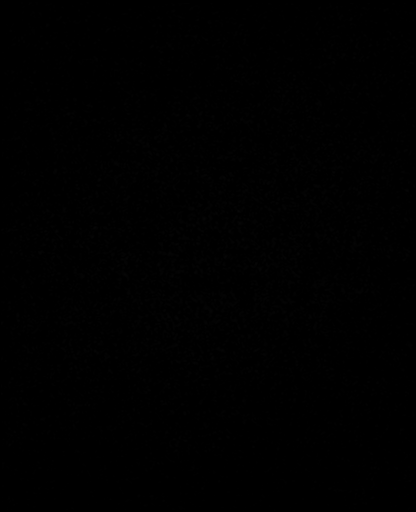

[Series 14: T2 · coronal · 5.0mm · 0.56mm/px · 2 of 28 slices shown (2 of 2)]
[im 1/28]
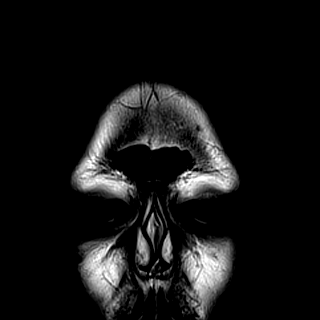
[im 28/28]
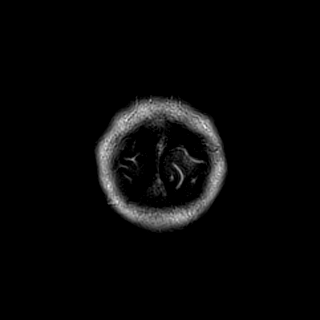

[Series 15: T1 · axial · 2.0mm · 0.42mm/px · z∈[-19,+7]mm · 2 of 79 slices shown (2 of 2)]
[im 1/79]
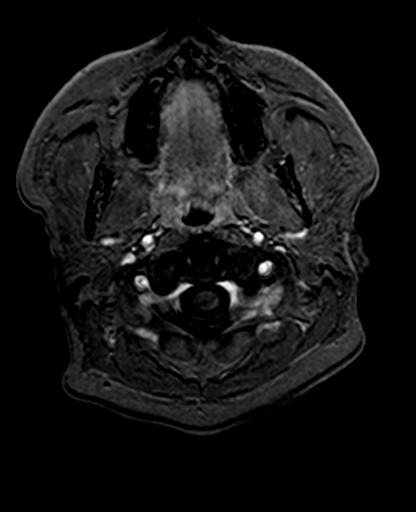
[im 14/79]
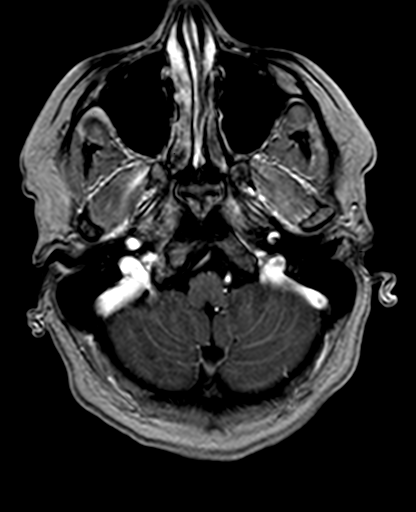

[Series 16: T1 post-contrast · coronal · 5.0mm · 0.38mm/px · 2 of 24 slices shown]
[im 1/24]
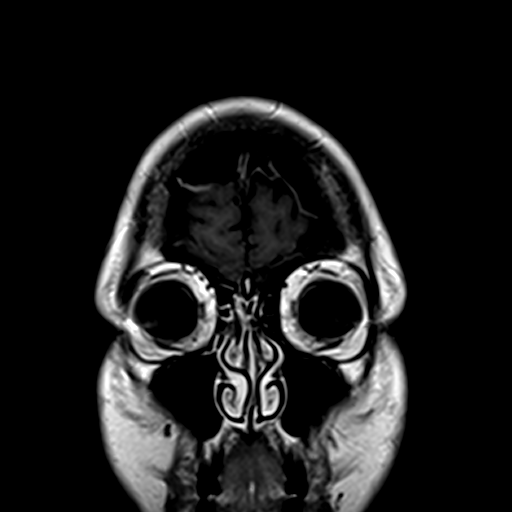
[im 24/24]
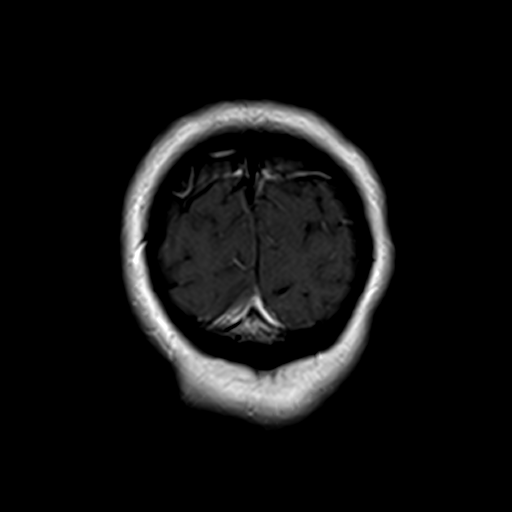

[34 of 48 positions shown; findings below may reference images not displayed]

FINDINGS: Brain: Multiple foci of somewhat ring-like restricted diffusion with
neighboring edematous appearance and local gyral expansion. These
are seen in the right superficial temporal, bilateral superficial
frontal, and parasagittal left frontal lobes-the latter reaching the
corpus callosum body. There is associated enhancement that has a
horseshoe type appearance. No central restricted diffusion for
abscess. No typical nodular enhancement for metastasis. A small
focus of nodular white matter enhancement lateral to the frontal
horn of the left lateral ventricle has subsided since comparison
brain MR. There is also a nonenhancing signal abnormality about the
temporal horn of the left lateral ventricle. The cortex is spared,
not expected for multifocal peripheral subacute infarct. The overall
pattern is that of demyelination with tumefactive features, as with
multiple sclerosis variant. There is no deep gray or brainstem
involvement as commonly seen with ERICKSON, cerebritis, or Gatto.

Vascular: Major flow voids are preserved.

Skull and upper cervical spine: No evidence of marrow lesion.

Sinuses/Orbits: Negative
IMPRESSION: Multiple areas of juxtacortical edema and enhancement, pattern
strongly favoring tumefactive demyelination. Please see above.

## 2019-12-15 IMAGING — RF DG FLUORO GUIDE NDL PLC/BX
1 series · 4 of 4 positions shown · non-contrast
Comparison: none

CLINICAL DATA: Multiple subcortical frontal lesions.

[Series 1: cp_standard · 0.17mm/px · 4 of 72 frames shown]
[frame 11/72]
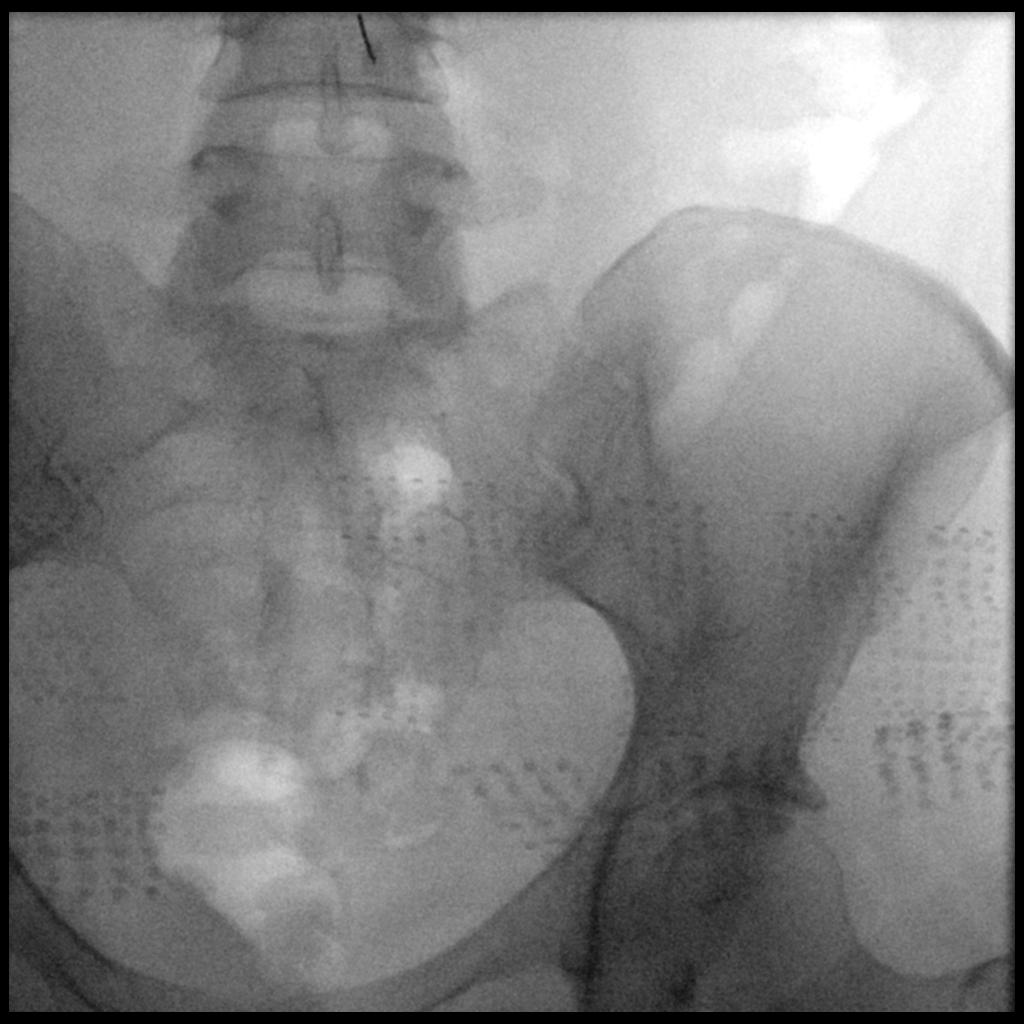
[frame 37/72]
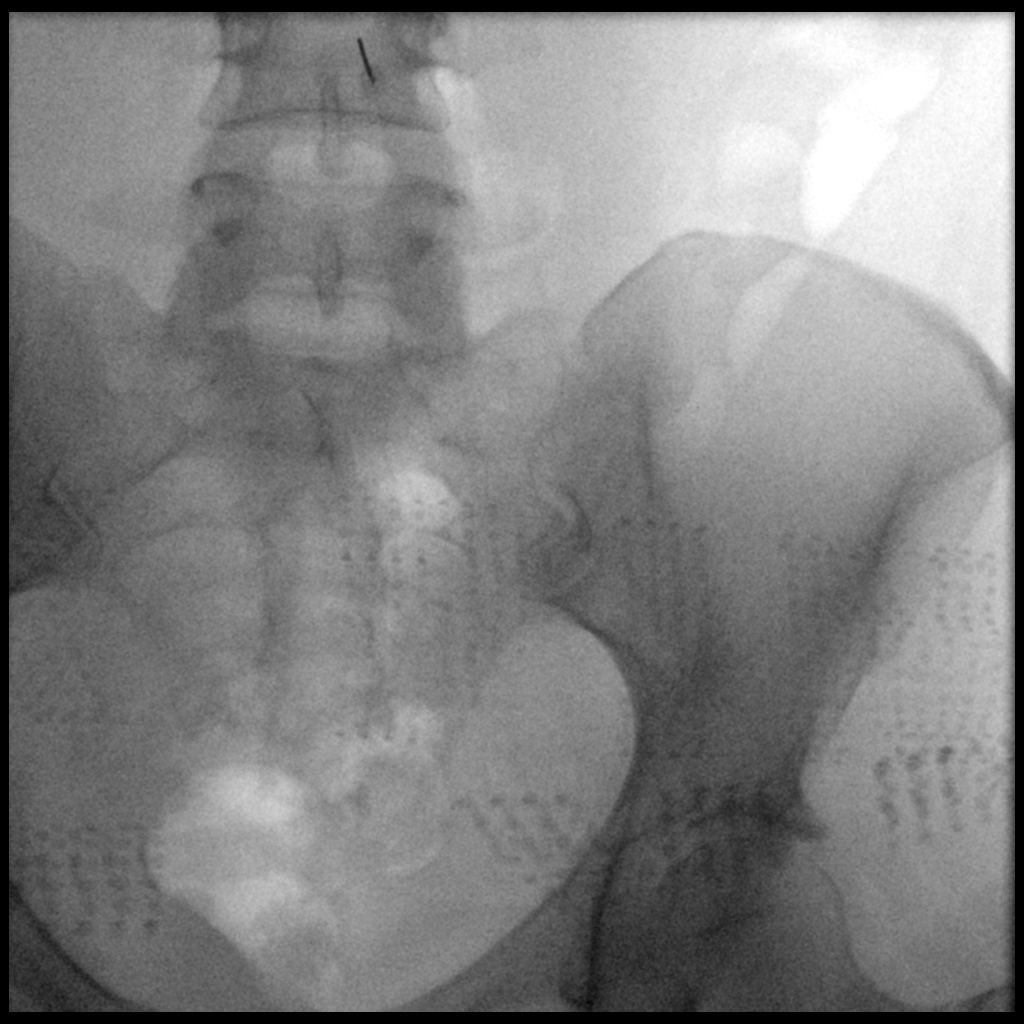
[frame 57/72]
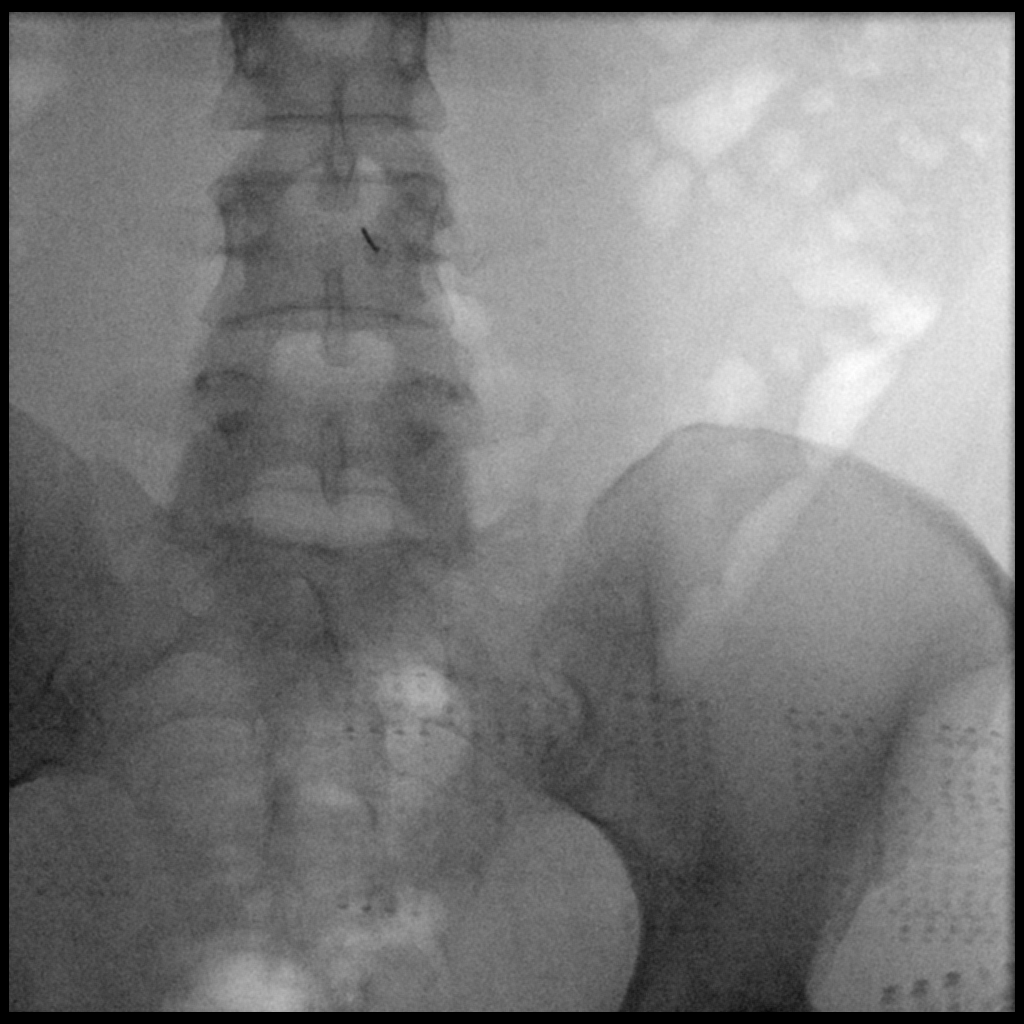
[frame 62/72]
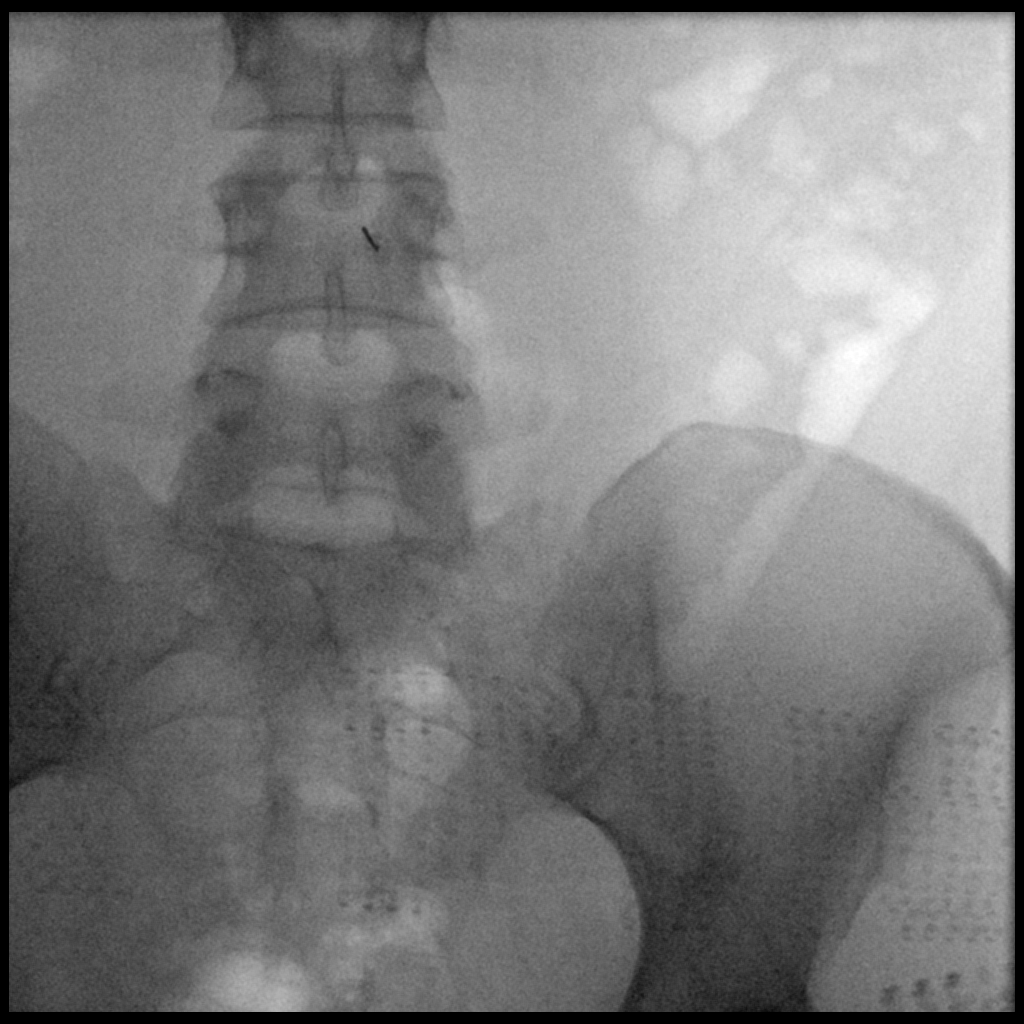

[4 of 4 positions shown; findings below may reference images not displayed]

EXAM:
DIAGNOSTIC LUMBAR PUNCTURE UNDER FLUOROSCOPIC GUIDANCE

FLUOROSCOPY TIME:  Fluoroscopy Time:  12 sec

Radiation Exposure Index (if provided by the fluoroscopic device):

Number of Acquired Spot Images: 0

PROCEDURE:
Informed consent was obtained from the patient prior to the
procedure, including potential complications of headache, allergy,
and pain. With the patient prone, the lower back was prepped with
Betadine. 1% Lidocaine was used for local anesthesia. Lumbar
puncture was performed at the L3-4 level using a 20 gauge needle
with return of clear CSF with an opening pressure of 13 cm water. 8
ml of CSF were obtained for laboratory studies. The patient
tolerated the procedure well and there were no apparent
complications.
IMPRESSION: Successful lumbar puncture under fluoroscopic guidance as above.

## 2020-01-04 DIAGNOSIS — Z0289 Encounter for other administrative examinations: Secondary | ICD-10-CM

## 2020-01-30 ENCOUNTER — Other Ambulatory Visit: Payer: Self-pay | Admitting: Neurology

## 2020-03-28 ENCOUNTER — Other Ambulatory Visit: Payer: Self-pay | Admitting: Neurology

## 2020-04-30 ENCOUNTER — Ambulatory Visit: Payer: Managed Care, Other (non HMO) | Admitting: Family Medicine

## 2020-04-30 ENCOUNTER — Encounter: Payer: Self-pay | Admitting: Family Medicine

## 2020-04-30 VITALS — BP 140/90 | HR 79 | Ht 66.0 in | Wt 172.0 lb

## 2020-04-30 DIAGNOSIS — G35 Multiple sclerosis: Secondary | ICD-10-CM

## 2020-04-30 DIAGNOSIS — R269 Unspecified abnormalities of gait and mobility: Secondary | ICD-10-CM | POA: Diagnosis not present

## 2020-04-30 DIAGNOSIS — Z79899 Other long term (current) drug therapy: Secondary | ICD-10-CM

## 2020-04-30 DIAGNOSIS — F09 Unspecified mental disorder due to known physiological condition: Secondary | ICD-10-CM

## 2020-04-30 DIAGNOSIS — G4489 Other headache syndrome: Secondary | ICD-10-CM

## 2020-04-30 DIAGNOSIS — R5383 Other fatigue: Secondary | ICD-10-CM

## 2020-04-30 DIAGNOSIS — F419 Anxiety disorder, unspecified: Secondary | ICD-10-CM

## 2020-04-30 NOTE — Progress Notes (Signed)
I have read the note, and I agree with the clinical assessment and plan.  Aubrey Blackard A. Chayce Robbins, MD, PhD, FAAN Certified in Neurology, Clinical Neurophysiology, Sleep Medicine, Pain Medicine and Neuroimaging  [facility] [address], [address] [place], [place] [address] [phone]  

## 2020-04-30 NOTE — Patient Instructions (Addendum)
Below is our plan:  We will continue [name]. I will update labs and we will order MRI for evaluation. Please continue regular activity.   Please make sure you are staying well hydrated. I recommend 50-60 ounces daily. Well balanced diet and regular exercise encouraged.   Please continue follow up with care team as directed.   Follow up with Dr Epimenio Foot in 6 months   You may receive a survey regarding today's visit. I encourage you to leave honest feed back as I do use this information to improve patient care. Thank you for seeing me today!    Memory Compensation Strategies  1. Use "WARM" strategy.  W= write it down  A= associate it  R= repeat it  M= make a mental note  2.   You can keep a Glass blower/designer.  Use a 3-ring notebook with sections for the following: calendar, important names and phone numbers,  medications, doctors' names/phone numbers, lists/reminders, and a section to journal what you did  each day.   3.    Use a calendar to write appointments down.  4.    Write yourself a schedule for the day.  This can be placed on the calendar or in a separate section of the Memory Notebook.  Keeping a  regular schedule can help memory.  5.    Use medication organizer with sections for each day or morning/evening pills.  You may need help loading it  6.    Keep a basket, or pegboard by the door.  Place items that you need to take out with you in the basket or on the pegboard.  You may also want to  include a message board for reminders.  7.    Use sticky notes.  Place sticky notes with reminders in a place where the task is performed.  For example: " turn off the  stove" placed by the stove, "lock the door" placed on the door at eye level, " take your medications" on  the bathroom mirror or by the place where you normally take your medications.  8.    Use alarms/timers.  Use while cooking to remind yourself to check on food or as a reminder to take your medicine, or as a    reminder to make a call, or as a reminder to perform another task, etc.    Multiple Sclerosis Multiple sclerosis (MS) is a disease of the brain, spinal cord, and optic nerves (central nervous system). It causes the body's disease-fighting (immune) system to destroy the protective covering (myelin sheath) around nerves in the brain. When this happens, signals (nerve impulses) going to and from the brain and spinal cord do not get sent properly or may not get sent at all. There are several types of MS:  Relapsing-remitting MS. This is the most common type. This causes sudden attacks of symptoms. After an attack, you may recover completely until the next attack, or some symptoms may remain permanently.  Secondary progressive MS. This usually develops after the onset of relapsing-remitting MS. Similar to relapsing-remitting MS, this type also causes sudden attacks of symptoms. Attacks may be less frequent, but symptoms slowly get worse (progress) over time.  Primary progressive MS. This causes symptoms that steadily progress over time. This type of MS does not cause sudden attacks of symptoms. The age of onset of MS varies, but it often develops between 37-11 years of age. MS is a lifelong (chronic) condition. There is no cure, but treatment can help slow  down the progression of the disease. What are the causes? The cause of this condition is not known. What increases the risk? You are more likely to develop this condition if:  You are a woman.  You have a relative with MS. However, the condition is not passed from parent to child (inherited).  You have a lack (deficiency) of vitamin D.  You smoke. MS is more common in the [place] than in the [place]. What are the signs or symptoms? Relapsing-remitting and secondary progressive MS cause symptoms to occur in episodes or attacks that may last weeks to months. There may be long periods between attacks in which  there are almost no symptoms. Primary progressive MS causes symptoms to steadily progress after they develop. Symptoms of MS vary because of the many different ways it affects the central nervous system. The main symptoms include:  Vision problems and eye pain.  Numbness.  Weakness.  Inability to move your arms, hands, feet, or legs (paralysis).  Balance problems.  Shaking that you cannot control (tremors).  Muscle spasms.  Problems with thinking (cognitive changes). MS can also cause symptoms that are associated with the disease, but are not always the direct result of an MS attack. They may include:  Inability to control urination or bowel movements (incontinence).  Headaches.  Fatigue.  Inability to tolerate heat.  Emotional changes.  Depression.  Pain. How is this diagnosed? This condition is diagnosed based on:  Your symptoms.  A neurological exam. This involves checking central nervous system function, such as nerve function, reflexes, and coordination.  MRIs of the brain and spinal cord.  Lab tests, including a lumbar puncture that tests the fluid that surrounds the brain and spinal cord (cerebrospinal fluid).  Tests to measure the electrical activity of the brain in response to stimulation (evoked potentials). How is this treated? There is no cure for MS, but medicines can help decrease the number and frequency of attacks and help relieve nuisance symptoms. Treatment options may include:  Medicines that reduce the frequency of attacks. These medicines may be given by injection, by mouth (orally), or through an IV.  Medicines that reduce inflammation (steroids). These may provide short-term relief of symptoms.  Medicines to help control pain, depression, fatigue, or incontinence.  Vitamin D, if you have a deficiency.  Using devices to help you move around (assistive devices), such as braces, a cane, or a walker.  Physical therapy to strengthen and  stretch your muscles.  Occupational therapy to help you with everyday tasks.  Alternative or complementary treatments such as exercise, massage, or acupuncture. Follow these instructions at home:  Take over-the-counter and prescription medicines only as told by your health care provider.  Do not drive or use heavy machinery while taking prescription pain medicine.  Use assistive devices as recommended by your physical therapist or your health care provider.  Exercise as directed by your health care provider.  Return to your normal activities as told by your health care provider. Ask your health care provider what activities are safe for you.  Reach out for support. Share your feelings with friends, family, or a support group.  Keep all follow-up visits as told by your health care provider and therapists. This is important. Where to find more information  National Multiple Sclerosis Society: https://www.nationalmssociety.org Contact a health care provider if:  You feel depressed.  You develop new pain or numbness.  You have tremors.  You have problems with sexual function. Get  help right away if:  You develop paralysis.  You develop numbness.  You have problems with your bladder or bowel function.  You develop double vision.  You lose vision in one or both eyes.  You develop suicidal thoughts.  You develop severe confusion. If you ever feel like you may hurt yourself or others, or have thoughts about taking your own life, get help right away. You can go to your nearest emergency department or call:  Your local emergency services (911 in the [place]  A suicide crisis helpline, such as the National Suicide Prevention Lifeline at (831)297-9114. This is open 24 hours a day. Summary  Multiple sclerosis (MS) is a disease of the central nervous system that causes the body's immune system to destroy the protective covering (myelin sheath) around nerves in the  brain.  There are 3 types of MS: relapsing-remitting, secondary progressive, and primary progressive. Relapsing-remitting and secondary progressive MS cause symptoms to occur in episodes or attacks that may last weeks to months. Primary progressive MS causes symptoms to steadily progress after they develop.  There is no cure for MS, but medicines can help decrease the number and frequency of attacks and help relieve nuisance symptoms. Treatment may also include physical or occupational therapy.  If you develop numbness, paralysis, vision problems, or other neurological symptoms, get help right away. This information is not intended to replace advice given to you by your health care provider. Make sure you discuss any questions you have with your health care provider. Document Revised: 05/01/2017 Document Reviewed: 07/28/2016 Elsevier Patient Education  2020 [employer].

## 2020-04-30 NOTE — Progress Notes (Signed)
Chief Complaint  Patient presents with  . Follow-up    pt is experiencing new internal viberations, facial twitching, nausea for the past month and itchy shil and joint pain. Pt said he speach is worse. Pt said she head jerking. Pt said 2x in the past 2 months was incontinent     HISTORY OF PRESENT ILLNESS: Today 04/30/20  Lindsey Phelps is a 39 y.o. female here today for follow up for RRMS. She continues [name]. She is concerned about possible side effects with [name]. She last had MRI brain in 05/2019 that was stable. Cervical and thoracic normal in 11/2018.   She has multiple concerns today. She feels skin is more itchy. She reports that skin itching or sensation that something is crawling on her every day. She reports facial twitching and pain. She reports recent dental check up was normal. She has a constant headache. She is having more nausea recently. She has had 2 episodes bowel incontinence. Last episode about a month ago. She reports lying in bed and had a bowel movement, she did not feel sensation to go to the restroom. No other incontinence since. She denies significant back pain.    She feels that flexeril helps with muscle weakness and pain. She usually takes 1 tablet at night 2-3 times a week.   She feels that Adderall has helped with word finding difficulty. She continues to have intermittent difficulty. She can't tolerate Adderall every day as she feels that it causes her to be more active which worsens pain. She continues to have difficulty with short and long term memory loss. She "feels spaced out" and confused regularly. She admits that she has anxiety but is not interested in treating at this time. She has tried and failed Celexa in the past. She tries to stay active. She has 6 acres and tends to her chickens every day. She does not drink water. She drinks tea/coffe and diet sodas.    HISTORY (copied from previous note)  Lindsey Phelps is a 39 y.o. woman with  relapsing remiting MS with multiple tumefactive lesions.  Update 10/24/2019: She is on [name] and tolerates it well.   She is noting tingling and uncomfortable dysesthesias in her feet and hands.   The the right foot is worse and becomes painful at night.     Both hands have symmetric symptoms.   She tries a Flexeril and did better.    Gabapentin makes her too sleepy.  NCV/EMG 07/19/2019 did not show any neuropathy or evidence of radiculopathy.  She has residual mild right leg weakness affecting gait at times.   Speech is usually good though some word finding difficulties at times and reduced STM/focus.   As an example, she needs reminders to take hr medications.   She takes 1/2 phentermine daily with some benefit.     Valium at night helps her sleep.     She has some anxiety and agoraphobia.     Marland Kitchen   Update 04/25/2019: She has been on [name] for about 5 months now for relapsing remitting MS.  She had a little bit of hair loss and some itching in her legs > arms.   She had a rash after a tattoo (never happened with other ones).   She has had mild diarrhea with eating.   She has hair loss but it seems to getting better.   She notes a little more tingling and a stinging sensation at times.   Hands seem more  numb if she uses the hands, worse on her right.   She has more pain in her toes.    Gait is a little wobbly with right leg a little slower than her left.   She has urinary urgency.     She feels STM and anxiety are worse.   She has not always remembered what she has done a little earlier in the day.   She notes she tenses up at times.    She is on phentemine --- the whole dose made hr jittery so she takes just 1/2 pill.   She feels it does help focus a little bit  The occipital pain improved after the injections.   She still has some headache behind the eyes and in her temples  Update 12/14/2018: She switched from Tysabri to [name] and is tolerating it well.   LFTs have been fine.  She  is noting continued poor gait and some gait ataxia.   The right leg is numb at times and clumsier than the left.   She notes reduced right grip (she is right handed) and mild right leg weakness.     She is noting more urinary urgency and frequency.  She has had some visual changes and depth perception seems off.    She has trouble with cognitive tasks, especially learning, processing speed and focus (easily gets distracted and gets off task), verbal fluency, spelling.     She also has fatigue that worsens as the day goes on.  She cannot process when two people talk at once or there are distractions.   Cognitive and fatigue issues occur at work and at home.    She also has some anxiety.   Valium helps her fall asleep.  Buspar helped her anxiety a little bit  The occipital neuralgia neck and headache pain improved after the splenius capitus/occipitla nerve injection in May.  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    However, due to poor balance and slurred speech, she is having more difficulty doing her job as a Conservation officer, nature.    She notes that her symptoms worsen the longer she stands.   She is unable to sit on the job and unable to take breaks at her discretion.   She also notes cognitive issues at work and has had trouble adjusting to a new register system  Update 10/21/2018: She is on Tysabri.  She initially tolerated it well but over the last 4 infusions she will experience more pain in the right occipital region and temple.    She also notes pressure in her right eye.   There is a squeezing sensation.   The last infusion was associated with pain starting during the infusion.   Last year, she just had headaches intermittently.   She started Tysabri in March, 2019.  She would like to stop Tysabri due to the side effects and not to go on any disease modifying therapy.  I had a  conversation with her about the possibility that she has an aggressive MS due to the MRI changes that were occurring in late 2018 in early 2019 with notable tumefactive lesions.  She appears to have done well on Tysabri.  She was most interested in no therapy but will consider [name].  If the COVID endemic improves Mavenclad might be something that she would be more interested in  due to the short course and long duration of efficacy.  I gave her information on both treatments.  Physically she is doing about the same.  Her gait is off balance.  Strength is doing ok.  She does not note major numbness.  Her slurred speech resolved for the most part.  There is some urinary frequency but no recent incontinence..  She also notes more cognitive fog and more mood irritability over the last several months as well.  She also has fatigue. \ She is reporting a headache starting in the right occiput and radiating behind the right eye.  This is similar to headache in the past that responded to injections.  Her mom had optic neuritis about 20 years ago and some MRI changes initially felt to possibly be MS but she was never placed on any disease modifying therapy.  Update 04/22/2018: She feels her MS has been mostly stable.  She is on Tysabri.  She tolerates it well.  Her next infusion will be tomorrow.  The last JCV antibody titer was 0.17 (-) on 11/20/2017.  Her last MRI was 03/03/2018 and showed a stable number of lesions.  Some were reduced in size.  None of them enhanced.  Gait is doing well.   She gets rare jerks while walking but these have not worsened.   No new numbness or weakness.    Slurred speech resolved (occasioanl mild if tired).   She has urinary urgency and rare urge incontinence.   She has occasioanl word finding difficulty, also much better.  Neck pain and headaches improved after the splenius capitus TPI.   HA also better after getting her new glasses.      She took care of a cat with  rabies but did not get bit (other family did and got treated -- no one had clinical rabies).  That was early September.     Update 03/08/2018: She is on Tysabri and tolerates it well.    The last JCV Ab titer was 0.17 negative on 11/20/2017.    She reports constant dizziness with eye pain and blurriness, R > left, for the past 8 days.      She also has had right occipital tenderness x 6-8 weeks sometimes feeling like an icepick. Last Wednesday, she was in bed and suddenly in the right leg and she had pins/needles sensation in the right hand.   She also noted worse visual disturbance bilaterally.     Sometimes swallowing seems difficult.    She gets a lot of nasal congestion.    MRI does not show sinusitis.     She was taking care of a cat with rabies and family got the rabies shots but she did not.    She was not scratched or bitten.   She last touched the cat 01/31/2018.     She went to the ED 03/03/2018 and an MRI was performed.   I personally reviewed the images.  There were no new lesions and the MRI is essentially unchanged compared to 11/10/2017 and improved compared to earlier ones..     IMPRESSION: 1. No acute intracranial process. 2. Stable number and stable to decreased size of supratentorial white matter lesions most compatible with chronic demyelination (tumefactive component). No enhancement. No parenchymal brain volume loss for age.  UPDATE 11/20/2017: She tolerated the first two infusions well but has noted pressure in her sinuses and altered since the thirs infusion.    She is also reporting more pain in  her legs and muscle cramps the last month.    She also has a stabbing pain in her chest similar to how she felt with pneumonia.   She continues to note word finding errors and it seems to be more fluctuating.     She had a 40 minute episode of palpitations/tachycardia and increased anxiety a few weeks ago.    She still notes some numbness in her mouth but it is different.     IMPRESSION: This MRI of the brain with and without contrast shows the following: 1. Multiple T2/flair hyperintense foci in the juxtacortical and periventricular white matter in a pattern and configuration consistent with chronic demyelinating plaque associated with multiple sclerosis. When compared to the prior study 07/28/2017, lesions that enhanced on the previous study no longer do so. There are no new lesions. The previous MRI showed severe edema associated with most of the foci, consistent with tumefactive MS. 2. There is a normal enhancement pattern and there are no acute findings.  Update 08/06/2017: Since the last visit, her cerebrospinal fluid was borderline with 3 oligoclonal bands. MRI of the spinal cord did not show any additional lesions. There was no evidence of tumor on chest/abdomen/pelvic CT scan.     Additionally  she saw Dr. Venetia Maxon in neurosurgery. He also ran her case by neuroradiology and neuro-oncology. After the steroids, there was an improved appearance on the MRI. There were no new lesions.   I discussed with body that the likelihood that she has tumefactive MS is probably higher than the likelihood of a CNS lymphoma or other disorder.   Therefore an option would be to hold off on a brain biopsy and do several months treatment and then reimage. On re-imaging, if there is worsening, we would need to reconsider a brain biopsy.  Clinically, her speech is better.  Balance is better.    She notes positional numbness in her hands, worse with tasks like holding a phone.  Sometimes she wakes up with the numbness.    It then improves.      On Buspar, anxiety improved but she got headaches.    On 5 mg po bid, she is still getting headache but they are tolerable and her anxiety is still better.    Update 07/09/2017: She woke up Friday morning with more aphasia and dysphagia.   She has had some difficulty with her gait but it was no worse over the last month.   She also  has had more trouble with cognition and processing speed and executive functioning over the past month.    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    She has noted mild visual changes over time but notes her glasses are old and no asymmetry or color vision changes.   She denies any bladder issues.    She feels much more tired over the past 3 months.     She sleeps ok if she takes valium at night.     She was admitted to [facility] last week when she presented with worsening gait and worsening aphasia. An MRI was performed. Besides the left frontal lesion noted on the previous MRI, she had 4 additional large edema tenderness lesions that enhanced, one in the left frontal lobe, 2 in the right frontal lobe and another in the right temporal lobe. He also has a couple of punctate T2/FLAIR hyperintense foci that are less specific areas the small lesions did not enhance.   She received 2  days of IV Solu-Medrol and was discharged yesterday.  Update 06/11/2017: I first saw her 03/31/2017.   Was concerned about her word finding difficulties and an MRI of the brain was performed. It showed a subacute left frontal stroke. There were a few other punctate T2/FLAIR hyperintense foci consistent with minimal chronic microvascular ischemic change.   I was most concerned about an embolic etiology. The carotid ultrasound was essentially normal. The cardiac event monitor was essentially normal.  A TEE showed a normal ejection fraction of 60-65 percent.   There was no evidence of a patent foramen ovale or other right-to-left shunt. The study was felt to be normal.  She is on aspirin, metoprolol and Crestor.  Here to the previous visit, she feels her speech is not much  Better abd she still has some aphasia and feels she needs to concentrate more with her speech.  She feels her right arm is still clumsy.   She has noted she spells words wrong a lot and her typing is slowed with more errors.   She still has some  slurring. There are no new symptoms.l;  She also has some anxiety and BuSpar was started.   It helped some but may have contributed to dizziness and headaches so the dose was decreased to 5 mg twice a day.    Headaches are less frequent in general (she was having more at last visit).   She will get occasional sharp pains.    She used to smoke but none in 4 years (quit after dad had lung cancer). No OCP (had hysterectomy)  She has FH of strokes and MI's .    From 03/31/2017: I had the pleasure seeing you patient, Lindsey Phelps, at [facility] for neurologic consultation regarding her numbness, word finding difficulties, balance issues and other neurologic symptoms.  She is a 39 yo woman who had an episode with chest pain and bilateral arm numbness on 01/27/2016 and was taken to the ED (workup was normal).  She who noted numbness in her jaw at night about 5 months ago when starting to fall asleep.  In August 2018, she had another episode of chest tightness and arm numbness and went to an ED.   Since then, she has noted more symptoms such as slurred speech, verbal fluency issues, leg weakness and balance issues.    These symptoms mostly come and go for 10 - 30 minutes and now occur daily.   They occur randomly and in any position (sitting, standing, walking).    She also notes vision is blurry and she has episodes of worsening blurry vision, also lasting 10-30 minutes.   She has vertigo that is mild but constant.   It intensifies for a few minutes at a time sometimes while she is walking.      She has had panic attacks since 2016 after her brother was in a serious accident but she feels current symptoms are different.  She takes valium prn, just a few a month.  Many years ago, she was on Celexa.   She continues to note episodes of palpitations and tachycardia.   Metoprolol was started with benefit.     I reviewed the laboratory results from 02/21/2017. B12 is low-normal at 289.  TSH, CBC and CMP are normal. LDL  and triglycerides were slightly elevated.   01/25/2017 CTA of the chest and CXR were normal.   She had a 30 day event monitor that showed intermittent tachycardia by her reports.  An Echo ws reportedly normal.      REVIEW OF SYSTEMS: Out of a complete 14 system review of symptoms, the patient complains only of the following symptoms, skin itching, dysesthesias, facial twitching, generalized pain and weakness, headaches, bowel incontinence, anxiety, memory loss, inattention, and all other reviewed systems are negative.   ALLERGIES: Allergies  Allergen Reactions  . Darvocet [Propoxyphene N-Acetaminophen] Hives  . Demerol Hives  . Adhesive [Tape] Rash and Other (See Comments)    Including bandaids - takes skin off, peels welts     HOME MEDICATIONS: Outpatient Medications Prior to Visit  Medication Sig Dispense Refill  . amphetamine-dextroamphetamine (ADDERALL XR) 10 MG 24 hr capsule TAKE ONE CAPSULE BY MOUTH ONCE DAILY 30 capsule 0  . [name] 14 MG TABS Take 14 mg by mouth daily. 30 tablet 11  . cetirizine-pseudoephedrine (ZYRTEC-D) 5-120 MG tablet Take 1 tablet by mouth 2 (two) times daily.    . cyclobenzaprine (FLEXERIL) 5 MG tablet Take up to 3 pills daily as needed 90 tablet 5  . diazepam (VALIUM) 10 MG tablet Take 10 mg by mouth at bedtime as needed for anxiety.     . diphenhydrAMINE HCl (BENADRYL PO) Take by mouth.    Marland Kitchen ibuprofen (ADVIL) 200 MG tablet Take 200 mg by mouth every 6 (six) hours as needed. Up to 3 as needed    . phentermine 37.5 MG capsule Take 1 capsule (37.5 mg total) by mouth every morning. (Patient taking differently: Take 37.5 mg by mouth every morning. Gets tablets--take 1/2 tab every morning) 30 capsule 5   Facility-Administered Medications Prior to Visit  Medication Dose Route Frequency Provider Last Rate Last Admin  . gadopentetate dimeglumine (MAGNEVIST) injection 18 mL  18 mL Intravenous Once PRN Sater, Richard A, MD      .  gadopentetate dimeglumine (MAGNEVIST) injection 18 mL  18 mL Intravenous Once PRN Sater, Pearletha Furl, MD         PAST MEDICAL HISTORY: Past Medical History:  Diagnosis Date  . Anxiety   . Cold   . Heart palpitations   . High cholesterol   . History of IBS   . Hypertension   . Multiple sclerosis (HCC)   . Stroke [facility])      PAST SURGICAL HISTORY: Past Surgical History:  Procedure Laterality Date  . ABDOMINAL HYSTERECTOMY    . BLADDER NECK SUSPENSION    . CHOLECYSTECTOMY    . COLONOSCOPY  01/23/2011   Procedure: COLONOSCOPY;  Surgeon: Malissa Hippo, MD;  Location: [facility];  Service: Endoscopy;  Laterality: N/A;  . LAPAROSCOPIC TUBAL LIGATION    . TEE WITHOUT CARDIOVERSION N/A 05/05/2017   Procedure: TRANSESOPHAGEAL ECHOCARDIOGRAM (TEE);  Surgeon: Laqueta Linden, MD;  Location: [facility];  Service: Cardiovascular;  Laterality: N/A;     FAMILY HISTORY: Family History  Problem Relation Age of Onset  . Diabetes Father   . Hypertension Father   . Colon polyps Father   . Lung cancer Father   . Hypertension Mother   . Coronary artery disease Mother   . COPD Mother      SOCIAL HISTORY: Social History   Socioeconomic History  . Marital status: Married    Spouse name: Not on file  . Number of children: Not on file  . Years of education: Not on file  . Highest education level: Not on file  Occupational History  . Not on file  Tobacco Use  . Smoking status: Former Smoker  Packs/day: 0.50    Years: 15.00    Pack years: 7.50    Types: Cigarettes    Quit date: 09/16/2012    Years since quitting: 7.6  . Smokeless tobacco: Never Used  Vaping Use  . Vaping Use: Never used  Substance and Sexual Activity  . Alcohol use: Yes    Alcohol/week: 1.0 standard drink    Types: 1 Shots of liquor per week    Comment: every few months  . Drug use: No  . Sexual activity: Yes    Birth control/protection: Surgical  Other Topics Concern  . Not on file  Social  History Narrative  . Not on file   Social Determinants of Health   Financial Resource Strain:   . Difficulty of Paying Living Expenses: Not on file  Food Insecurity:   . Worried About Programme researcher, broadcasting/film/video in the Last Year: Not on file  . Ran Out of Food in the Last Year: Not on file  Transportation Needs:   . Lack of Transportation (Medical): Not on file  . Lack of Transportation (Non-Medical): Not on file  Physical Activity:   . Days of Exercise per Week: Not on file  . Minutes of Exercise per Session: Not on file  Stress:   . Feeling of Stress : Not on file  Social Connections:   . Frequency of Communication with Friends and Family: Not on file  . Frequency of Social Gatherings with Friends and Family: Not on file  . Attends Religious Services: Not on file  . Active Member of Clubs or Organizations: Not on file  . Attends Banker Meetings: Not on file  . Marital Status: Not on file  Intimate Partner Violence:   . Fear of Current or Ex-Partner: Not on file  . Emotionally Abused: Not on file  . Physically Abused: Not on file  . Sexually Abused: Not on file      PHYSICAL EXAM  Vitals:   04/30/20 1100  BP: 140/90  Pulse: 79  Weight: 172 lb (78 kg)  Height: 5\' 6"  (1.676 m)   Body mass index is 27.76 kg/m.   Generalized: Well developed, in no acute distress  Cardiology: normal rate and rhythm, no murmur auscultated  Respiratory: clear to auscultation bilaterally    Neurological examination  Mentation: Alert oriented to time, place, history taking. Follows all commands speech and language fluent Cranial nerve II-XII: Pupils were equal round reactive to light. Extraocular movements were full, visual field were full on confrontational test. Facial sensation and strength were normal. Head turning and shoulder shrug  were normal and symmetric. Motor: The motor testing reveals 5 over 5 strength of all 4 extremities. Good symmetric motor tone is noted  throughout.  Sensory: Sensory testing is intact to soft touch on all 4 extremities. No evidence of extinction is noted.  Coordination: Cerebellar testing reveals good finger-nose-finger and heel-to-shin bilaterally.  Gait and station: Gait is stable with no assistive device, able to tandem without assistance  Reflexes: Deep tendon reflexes are symmetric and normal bilaterally.     DIAGNOSTIC DATA (LABS, IMAGING, TESTING) - I reviewed patient records, labs, notes, testing and imaging myself where available.  Lab Results  Component Value Date   WBC 5.7 10/24/2019   HGB 13.3 10/24/2019   HCT 39.9 10/24/2019   MCV 89 10/24/2019   PLT 247 10/24/2019      Component Value Date/Time   NA 136 03/03/2018 1736   K 3.4 (L) 03/03/2018 1736  CL 105 03/03/2018 1736   CO2 23 03/03/2018 1736   GLUCOSE 85 03/03/2018 1736   BUN 12 03/03/2018 1736   CREATININE 0.70 03/03/2018 1740   CALCIUM 8.8 (L) 03/03/2018 1736   PROT 6.6 10/24/2019 1335   ALBUMIN 4.5 10/24/2019 1335   AST 23 10/24/2019 1335   ALT 17 10/24/2019 1335   ALKPHOS 77 10/24/2019 1335   BILITOT 0.4 10/24/2019 1335   GFRNONAA >60 03/03/2018 1736   GFRAA >60 03/03/2018 1736   No results found for: CHOL, HDL, LDLCALC, LDLDIRECT, TRIG, CHOLHDL No results found for: [id number] Lab Results  Component Value Date   VITAMINB12 280 07/06/2017   No results found for: TSH    ASSESSMENT AND PLAN  39 y.o. year old female  has a past medical history of Anxiety, Cold, Heart palpitations, High cholesterol, History of IBS, Hypertension, Multiple sclerosis (HCC), and Stroke (HCC). here with  Multiple sclerosis (HCC) - Plan: MR BRAIN W WO CONTRAST, CBC with Differential/Platelets, CMP  Cognitive deficit secondary to MS [facility])  High risk medication use  Gait disturbance  Other headache syndrome  Other fatigue  Anxiety    Corlis continues to have multiple concerns, most are consistent with previous reports with the exception of two  episodes of bowel incontinence. No significant back pain. We will update labs and MRI brian and cervical spine. Will consider lumbar spine MRI and GI referral if incontinence continues. She will continue [name] for now. She is very hesitant to make medication changes or add new medications. We will continue to monitor symptoms very closely. She will continue Adderall and Flexeril as needed. Healthy lifestyle habits encouraged. Adequate hydration discussed. May benefit from increasing fiber intake. She will follow up with Dr Epimenio Foot in 6 months, sooner if needed.    I spent 40 minutes of face-to-face and non-face-to-face time with patient.  This included previsit chart review, lab review, study review, order entry, electronic health record documentation, patient education.    Shawnie Dapper, MSN, FNP-C 04/30/2020, 12:08 PM  [facility] [address] [place], [place] [address] [phone]

## 2020-05-01 ENCOUNTER — Telehealth: Payer: Self-pay

## 2020-05-01 ENCOUNTER — Telehealth: Payer: Self-pay | Admitting: Family Medicine

## 2020-05-01 LAB — CBC WITH DIFFERENTIAL/PLATELET
Basophils Absolute: 0.1 10*3/uL (ref 0.0–0.2)
Basos: 1 %
EOS (ABSOLUTE): 0.4 10*3/uL (ref 0.0–0.4)
Eos: 9 %
Hematocrit: 42 % (ref 34.0–46.6)
Hemoglobin: 14.2 g/dL (ref 11.1–15.9)
Immature Grans (Abs): 0 10*3/uL (ref 0.0–0.1)
Immature Granulocytes: 0 %
Lymphocytes Absolute: 2.4 10*3/uL (ref 0.7–3.1)
Lymphs: 48 %
MCH: 29.6 pg (ref 26.6–33.0)
MCHC: 33.8 g/dL (ref 31.5–35.7)
MCV: 88 fL (ref 79–97)
Monocytes Absolute: 0.3 10*3/uL (ref 0.1–0.9)
Monocytes: 7 %
Neutrophils Absolute: 1.8 10*3/uL (ref 1.4–7.0)
Neutrophils: 35 %
Platelets: 239 10*3/uL (ref 150–450)
RBC: 4.8 x10E6/uL (ref 3.77–5.28)
RDW: 12.7 % (ref 11.7–15.4)
WBC: 5 10*3/uL (ref 3.4–10.8)

## 2020-05-01 LAB — COMPREHENSIVE METABOLIC PANEL
ALT: 19 IU/L (ref 0–32)
AST: 20 IU/L (ref 0–40)
Albumin/Globulin Ratio: 1.9 (ref 1.2–2.2)
Albumin: 4.9 g/dL — ABNORMAL HIGH (ref 3.8–4.8)
Alkaline Phosphatase: 81 IU/L (ref 44–121)
BUN/Creatinine Ratio: 16 (ref 9–23)
BUN: 13 mg/dL (ref 6–24)
Bilirubin Total: 0.6 mg/dL (ref 0.0–1.2)
CO2: 24 mmol/L (ref 20–29)
Calcium: 10 mg/dL (ref 8.7–10.2)
Chloride: 104 mmol/L (ref 96–106)
Creatinine, Ser: 0.8 mg/dL (ref 0.57–1.00)
GFR calc Af Amer: 107 mL/min/{1.73_m2} (ref >59)
GFR calc non Af Amer: 93 mL/min/{1.73_m2} (ref >59)
Globulin, Total: 2.6 g/dL (ref 1.5–4.5)
Glucose: 90 mg/dL (ref 65–99)
Potassium: 4.7 mmol/L (ref 3.5–5.2)
Sodium: 142 mmol/L (ref 134–144)
Total Protein: 7.5 g/dL (ref 6.0–8.5)

## 2020-05-01 NOTE — Telephone Encounter (Signed)
no to the covid questions MR Brain w/wo contrast Amy Diona [place]ner auth: [id number] (exp. 05/01/20 to 07/30/20). Patient is scheduled at [facility] for 05/02/20. I informed her that when I called her insurance they were not 100% clear with me on what her benefits were. They told me it was covered at 100% but then they gave me her ded, co insurance and out of pocket info. I told the patient that we will file the insurance and whatever comes back we can set up a payment plan.

## 2020-05-01 NOTE — Telephone Encounter (Signed)
-----   Message from Shawnie Dapper, NP sent at 05/01/2020  1:09 PM EST ----- Labs look good. Continue current treatment plan.

## 2020-05-01 NOTE — Telephone Encounter (Signed)
Lindsey Phelps is a 39 y.o. female was contacted and notified of the message below. Pt verbalized understanding

## 2020-05-02 ENCOUNTER — Ambulatory Visit (INDEPENDENT_AMBULATORY_CARE_PROVIDER_SITE_OTHER): Payer: Managed Care, Other (non HMO)

## 2020-05-02 DIAGNOSIS — G35 Multiple sclerosis: Secondary | ICD-10-CM

## 2020-05-02 DIAGNOSIS — G35D Multiple sclerosis, unspecified: Secondary | ICD-10-CM

## 2020-05-02 MED ORDER — GADOBENATE DIMEGLUMINE 529 MG/ML IV SOLN
15.0000 mL | Freq: Once | INTRAVENOUS | Status: AC | PRN
  Administered 2020-05-02: 15 mL via INTRAVENOUS

## 2020-05-07 ENCOUNTER — Telehealth: Payer: Self-pay

## 2020-05-07 NOTE — Telephone Encounter (Signed)
Lindsey Phelps is a 39 y.o. female was contacted and notified of the message below. Pt has no additional questions or concerns. Pt verbalized understanding

## 2020-05-07 NOTE — Telephone Encounter (Signed)
-----   Message from Shawnie Dapper, NP sent at 05/07/2020  2:32 PM EST ----- Please let her know that her MRI results are stable. No new or worsening lesions. We will continue current treatment plan.

## 2020-06-17 ENCOUNTER — Other Ambulatory Visit: Payer: Self-pay | Admitting: Neurology

## 2020-06-17 DIAGNOSIS — G35 Multiple sclerosis: Secondary | ICD-10-CM

## 2020-07-17 ENCOUNTER — Other Ambulatory Visit (HOSPITAL_COMMUNITY): Payer: Self-pay | Admitting: Physician Assistant

## 2020-07-17 DIAGNOSIS — J22 Unspecified acute lower respiratory infection: Secondary | ICD-10-CM

## 2020-07-18 ENCOUNTER — Ambulatory Visit (HOSPITAL_COMMUNITY)
Admission: RE | Admit: 2020-07-18 | Discharge: 2020-07-18 | Disposition: A | Discharge status: Home / Self Care | Payer: Managed Care, Other (non HMO) | Source: Ambulatory Visit | Attending: Physician Assistant | Admitting: Physician Assistant

## 2020-07-18 ENCOUNTER — Telehealth: Payer: Self-pay | Admitting: *Deleted

## 2020-07-18 ENCOUNTER — Other Ambulatory Visit (HOSPITAL_COMMUNITY): Payer: Self-pay | Admitting: Physician Assistant

## 2020-07-18 ENCOUNTER — Other Ambulatory Visit: Payer: Self-pay

## 2020-07-18 DIAGNOSIS — J22 Unspecified acute lower respiratory infection: Secondary | ICD-10-CM | POA: Insufficient documentation

## 2020-07-18 DIAGNOSIS — M5442 Lumbago with sciatica, left side: Secondary | ICD-10-CM

## 2020-07-18 NOTE — Telephone Encounter (Signed)
Submitted PA. Received instant approval: "CaseId:[id number];Status:Approved;Review Type:Prior Auth;Coverage Start Date:07/18/2020;Coverage End Date:07/18/2021;"

## 2020-07-18 NOTE — Telephone Encounter (Signed)
Initiated PA [name] on CMM. Key: [id number]. Waiting on OV notes to be attached and then will submit for review.

## 2020-08-02 ENCOUNTER — Other Ambulatory Visit (HOSPITAL_COMMUNITY): Payer: Self-pay | Admitting: Internal Medicine

## 2020-08-02 DIAGNOSIS — R928 Other abnormal and inconclusive findings on diagnostic imaging of breast: Secondary | ICD-10-CM

## 2020-10-31 ENCOUNTER — Encounter: Payer: Self-pay | Admitting: Neurology

## 2020-10-31 ENCOUNTER — Ambulatory Visit: Payer: Managed Care, Other (non HMO) | Admitting: Neurology

## 2020-10-31 ENCOUNTER — Other Ambulatory Visit: Payer: Self-pay

## 2020-10-31 VITALS — BP 138/90 | HR 77 | Ht 66.0 in | Wt 184.0 lb

## 2020-10-31 DIAGNOSIS — G35 Multiple sclerosis: Secondary | ICD-10-CM

## 2020-10-31 DIAGNOSIS — R269 Unspecified abnormalities of gait and mobility: Secondary | ICD-10-CM

## 2020-10-31 DIAGNOSIS — R5383 Other fatigue: Secondary | ICD-10-CM

## 2020-10-31 DIAGNOSIS — R110 Nausea: Secondary | ICD-10-CM | POA: Insufficient documentation

## 2020-10-31 DIAGNOSIS — R200 Anesthesia of skin: Secondary | ICD-10-CM

## 2020-10-31 DIAGNOSIS — F09 Unspecified mental disorder due to known physiological condition: Secondary | ICD-10-CM

## 2020-10-31 DIAGNOSIS — R39198 Other difficulties with micturition: Secondary | ICD-10-CM

## 2020-10-31 MED ORDER — PROMETHAZINE HCL 25 MG PO TABS: 25.0000 mg | ORAL_TABLET | Freq: Two times a day (BID) | ORAL | 5 refills | Status: DC | PRN

## 2020-10-31 NOTE — Progress Notes (Signed)
[facility]  PATIENT: Lindsey Phelps DOB: Feb 07, 1981  REFERRING DOCTOR OR PCP:  Elfredia Nevins SOURCE: Patient, notes from Dr. Sherwood Gambler, laboratory results.  _________________________________   HISTORICAL  CHIEF COMPLAINT:  Chief Complaint  Patient presents with  . Multiple Sclerosis    Routine visit     HISTORY OF PRESENT ILLNESS:  Lindsey Phelps is a 40 y.o. woman with relapsing remiting MS with multiple tumefactive lesions.  Update 10/31/2020 She is on [name] and tolerates it ok.    She is having some nausea but no vomiting.    She has nausea daily now for the last 3 weeks.      She is noting tingling and uncomfortable tight band dysesthesia in her right lower leg and some craps, right > left.   The the right foot is worse and becomes painful at night.     Both hands have symmetric symptoms.   Flexeril helps her pain and helps her sleep at night   gabapentin makes her too sleepy.  NCV/EMG 07/19/2019 did not show any neuropathy or evidence of radiculopathy.   Steroids helped once for her right leg but other times more recently  has nor helped  She has residual mild right leg weakness affecting gait at times.   Speech is usually good though some word finding difficulties at times and reduced STM/focus.   As an example, she needs reminders to take hr medications.   She takes 1/2 phentermine daily with some benefit.     Valium at night helps her sleep.     She has some anxiety and agoraphobia.     She has had issues with cognition.   She is now on disability.   She saw an occupational doctor and psychology for testing.     She was told she needs someone else to handle finacnes, though she has never had diffiulty with that.  She tries to stay busy and raises a few chickens to stay more active.     .    MS History: In 03/31/2017 she experienced word finding difficulties and an MRI of the brain was performed concerning for a subacute left frontal stroke. There were  a few other punctate T2/FLAIR hyperintense foci consistent with minimal chronic microvascular ischemic change.  Stroke work-up was performed.  I was most concerned about an embolic etiology. The carotid ultrasound was essentially normal. The cardiac event monitor was essentially normal.  A TEE showed a normal ejection fraction of 60-65 percent.   There was no evidence of a patent foramen ovale or other right-to-left shunt. The study was felt to be normal.  She is on aspirin, metoprolol and Crestor.    A subsequent MRI 07/06/2017 showed multiple areas of juxtacortical edema and enhancement, most consistent with demyelination.  A biopsy was considered but due to his improvement on the subsequent MRI, more consistent with demyelination then tumor, this was held.  She was started on Tysabri 07/2017.  MRI 11/10/2017 was stable.  Due to tolerability issues, she stopped May 2020 and started on [name].  Imaging: MRI of the brain 04/07/2017 showed a partially enhancing focus in the left frontal lobe.  There were a couple punctate T2/FLAIR hyperintense foci elsewhere that were not nonspecific.  MRI of the brain 07/06/2017 showed multiple juxtacortical and periventricular enhancing lesions.  MRI of the brain 07/28/2017 showed improvement of several lesions noted on the previous MRI though one of the foci was larger.  MRI of the brain 11/10/2017 showed no  new lesions and resolution of the enhancement seen on the previous MRI  MRI of the brain 03/03/2018 showed no new lesions  MRI of the brain 11/03/2018 showed multiple juxtacortical and periventricular white matter foci.  None of these were new.  They do not enhance.  MRI of the cervical and thoracic spine 11/03/2018 showed a normal spinal cord.  MRI of the brain 05/10/2019 showed no new lesions.  REVIEW OF SYSTEMS: Constitutional: No fevers, chills, sweats, or change in appetite.   She has mild sleep onset insomnia Eyes: No visual changes, double vision, eye pain Ear,  nose and throat: No hearing loss, ear pain, nasal congestion, sore throat Cardiovascular: No chest pain.  Has intermittent tachycardia.  Respiratory: No shortness of breath at rest or with exertion.   No wheezes GastrointestinaI: No nausea, vomiting, diarrhea, abdominal pain, fecal incontinence Genitourinary: No dysuria, urinary retention or frequency.  No nocturia. Musculoskeletal: No neck pain, back pain Integumentary: No rash, pruritus, skin lesions Neurological: as above Psychiatric: No depression at this time.  H/o panic attacks. Endocrine: No palpitations, diaphoresis, change in appetite, change in weigh or increased thirst Hematologic/Lymphatic: No anemia, purpura, petechiae. Allergic/Immunologic: has some seasonal allergies  ALLERGIES: Allergies  Allergen Reactions  . Darvocet [Propoxyphene N-Acetaminophen] Hives  . Demerol Hives  . Adhesive [Tape] Rash and Other (See Comments)    Including bandaids - takes skin off, peels welts    HOME MEDICATIONS:  Current Outpatient Medications:  .  amphetamine-dextroamphetamine (ADDERALL XR) 10 MG 24 hr capsule, TAKE ONE CAPSULE BY MOUTH ONCE DAILY, Disp: 30 capsule, Rfl: 0 .  [name] 14 MG TABS, TAKE 1 TABLET DAILY, Disp: 30 tablet, Rfl: 11 .  cetirizine-pseudoephedrine (ZYRTEC-D) 5-120 MG tablet, Take 1 tablet by mouth 2 (two) times daily., Disp: , Rfl:  .  cyclobenzaprine (FLEXERIL) 5 MG tablet, Take up to 3 pills daily as needed, Disp: 90 tablet, Rfl: 5 .  diazepam (VALIUM) 10 MG tablet, Take 10 mg by mouth at bedtime as needed for anxiety. , Disp: , Rfl:  .  diphenhydrAMINE HCl (BENADRYL PO), Take by mouth., Disp: , Rfl:  .  ibuprofen (ADVIL) 200 MG tablet, Take 200 mg by mouth every 6 (six) hours as needed. Up to 3 as needed, Disp: , Rfl:  .  promethazine (PHENERGAN) 25 MG tablet, Take 1 tablet (25 mg total) by mouth 2 (two) times daily as needed for nausea or vomiting., Disp: 60 tablet, Rfl: 5 No current facility-administered  medications for this visit.  Facility-Administered Medications Ordered in Other Visits:  .  gadopentetate dimeglumine (MAGNEVIST) injection 18 mL, 18 mL, Intravenous, Once PRN, Shagun Wordell A, MD .  gadopentetate dimeglumine (MAGNEVIST) injection 18 mL, 18 mL, Intravenous, Once PRN, Khaliel Morey, Pearletha Furlichard A, MD  PAST MEDICAL HISTORY: Past Medical History:  Diagnosis Date  . Anxiety   . Cold   . Heart palpitations   . High cholesterol   . History of IBS   . Hypertension   . Multiple sclerosis (HCC)   . Stroke [facility](HCC)     PAST SURGICAL HISTORY: Past Surgical History:  Procedure Laterality Date  . ABDOMINAL HYSTERECTOMY    . BLADDER NECK SUSPENSION    . CHOLECYSTECTOMY    . COLONOSCOPY  01/23/2011   Procedure: COLONOSCOPY;  Surgeon: Malissa HippoNajeeb U Rehman, MD;  Location: [facility];  Service: Endoscopy;  Laterality: N/A;  . LAPAROSCOPIC TUBAL LIGATION    . TEE WITHOUT CARDIOVERSION N/A 05/05/2017   Procedure: TRANSESOPHAGEAL ECHOCARDIOGRAM (TEE);  Surgeon: Prentice DockerKoneswaran, Suresh  A, MD;  Location: [facility];  Service: Cardiovascular;  Laterality: N/A;    FAMILY HISTORY: Family History  Problem Relation Age of Onset  . Diabetes Father   . Hypertension Father   . Colon polyps Father   . Lung cancer Father   . Hypertension Mother   . Coronary artery disease Mother   . COPD Mother     SOCIAL HISTORY:  Social History   Socioeconomic History  . Marital status: Married    Spouse name: Not on file  . Number of children: Not on file  . Years of education: Not on file  . Highest education level: Not on file  Occupational History  . Occupation: on disability  Tobacco Use  . Smoking status: Former Smoker    Packs/day: 0.50    Years: 15.00    Pack years: 7.50    Types: Cigarettes    Quit date: 09/16/2012    Years since quitting: 8.1  . Smokeless tobacco: Never Used  Vaping Use  . Vaping Use: Never used  Substance and Sexual Activity  . Alcohol use: Yes    Alcohol/week: 1.0  standard drink    Types: 1 Shots of liquor per week    Comment: every few months  . Drug use: No  . Sexual activity: Yes    Birth control/protection: Surgical  Other Topics Concern  . Not on file  Social History Narrative  . Not on file   Social Determinants of Health   Financial Resource Strain: Not on file  Food Insecurity: Not on file  Transportation Needs: Not on file  Physical Activity: Not on file  Stress: Not on file  Social Connections: Not on file  Intimate Partner Violence: Not on file     PHYSICAL EXAM  Vitals:   10/31/20 1127  BP: 138/90  Pulse: 77  Weight: 184 lb (83.5 kg)  Height: 5\' 6"  (1.676 m)    Body mass index is 29.7 kg/m.   General: The patient is well-developed and well-nourished and in no acute distress.   Has only mild right occipital tenderness now, much better than last visit. Neurologic Exam  Mental status: She is alert and oriented x 3 at the time of the examination. The patient has apparent normal recent and remote memory, with an apparently normal attention span and concentration ability.  She had no noticeable speech issue during today's visit.  Cranial nerves:   Color vision is symmetric.  Extraocular movements are full.  Facial strength was normal.  Trapezius strength was normal.  The tongue is midline, and the patient has symmetric elevation of the soft palate. No obvious hearing deficits are noted.  Motor:  Muscle bulk is normal.   Tone is normal.  Strength 5/5.  I Sensory:   Reduced touch and vibration on right arm and leg compared to left  Coordination: Cerebellar testing reveals good finger-nose-finger and left heel-to-shin.  She has reduced right heel-to-shin.  Gait and station: Station is normal.  Gait is slightly wide.  Tandem gait is wide...  Romberg is negative.   Reflexes: Deep tendon reflexes are symmetric and slightly increased in the arms, increased at the knees with crossed adductors, right >left.  No ankle clonus.         DIAGNOSTIC DATA (LABS, IMAGING, TESTING) - I reviewed patient records, labs, notes, testing and imaging myself where available.  Lab Results  Component Value Date   WBC 5.0 04/30/2020   HGB 14.2 04/30/2020   HCT 42.0  04/30/2020   MCV 88 04/30/2020   PLT 239 04/30/2020      Component Value Date/Time   NA 142 04/30/2020 1147   K 4.7 04/30/2020 1147   CL 104 04/30/2020 1147   CO2 24 04/30/2020 1147   GLUCOSE 90 04/30/2020 1147   GLUCOSE 85 03/03/2018 1736   BUN 13 04/30/2020 1147   CREATININE 0.80 04/30/2020 1147   CALCIUM 10.0 04/30/2020 1147   PROT 7.5 04/30/2020 1147   ALBUMIN 4.9 (H) 04/30/2020 1147   AST 20 04/30/2020 1147   ALT 19 04/30/2020 1147   ALKPHOS 81 04/30/2020 1147   BILITOT 0.6 04/30/2020 1147   GFRNONAA 93 04/30/2020 1147   GFRAA 107 04/30/2020 1147       ASSESSMENT AND PLAN  1. Multiple sclerosis (HCC)   2. Nausea   3. Cognitive deficit secondary to MS (HCC)   4. Other fatigue   5. Gait disturbance   6. Numbness   7. Urinary dysfunction     1.   Continue [name].  We will check some lab work.  Later in the year we will check an MRI of the brain to determine if there is any subclinical progression. 2.   Continue Flexeril as needed 3.  Continue Adderall XR 10 milligrams 4.   She is on disability.  Although she has impairments that prevent her from working a job that are both physical and cognitive, I believe she is able to handle her finances. 5.   Return to see me in 6 months or sooner if there are new or worsening neurologic symptoms.     Dianelys Scinto A. Epimenio Foot, MD, PhD, FAAN Certified in Neurology, Clinical Neurophysiology, Sleep Medicine, Pain Medicine and Neuroimaging Director, [facility] at [facility]  [facility] [address] [place], [place] [address] [phone]

## 2020-11-01 LAB — CBC WITH DIFFERENTIAL/PLATELET
Basophils Absolute: 0.1 10*3/uL (ref 0.0–0.2)
Basos: 1 %
EOS (ABSOLUTE): 0.3 10*3/uL (ref 0.0–0.4)
Eos: 7 %
Hematocrit: 40.2 % (ref 34.0–46.6)
Hemoglobin: 12.9 g/dL (ref 11.1–15.9)
Immature Grans (Abs): 0 10*3/uL (ref 0.0–0.1)
Immature Granulocytes: 0 %
Lymphocytes Absolute: 2.4 10*3/uL (ref 0.7–3.1)
Lymphs: 45 %
MCH: 29.5 pg (ref 26.6–33.0)
MCHC: 32.1 g/dL (ref 31.5–35.7)
MCV: 92 fL (ref 79–97)
Monocytes Absolute: 0.4 10*3/uL (ref 0.1–0.9)
Monocytes: 8 %
Neutrophils Absolute: 2 10*3/uL (ref 1.4–7.0)
Neutrophils: 39 %
Platelets: 256 10*3/uL (ref 150–450)
RBC: 4.38 x10E6/uL (ref 3.77–5.28)
RDW: 12.8 % (ref 11.7–15.4)
WBC: 5.2 10*3/uL (ref 3.4–10.8)

## 2020-11-01 LAB — COMPREHENSIVE METABOLIC PANEL
ALT: 21 IU/L (ref 0–32)
AST: 26 IU/L (ref 0–40)
Albumin/Globulin Ratio: 2.6 — ABNORMAL HIGH (ref 1.2–2.2)
Albumin: 4.7 g/dL (ref 3.8–4.8)
Alkaline Phosphatase: 72 IU/L (ref 44–121)
BUN/Creatinine Ratio: 19 (ref 9–23)
BUN: 15 mg/dL (ref 6–24)
Bilirubin Total: 0.7 mg/dL (ref 0.0–1.2)
CO2: 24 mmol/L (ref 20–29)
Calcium: 9.6 mg/dL (ref 8.7–10.2)
Chloride: 100 mmol/L (ref 96–106)
Creatinine, Ser: 0.77 mg/dL (ref 0.57–1.00)
Globulin, Total: 1.8 g/dL (ref 1.5–4.5)
Glucose: 85 mg/dL (ref 65–99)
Potassium: 4.5 mmol/L (ref 3.5–5.2)
Sodium: 140 mmol/L (ref 134–144)
Total Protein: 6.5 g/dL (ref 6.0–8.5)
eGFR: 99 mL/min/{1.73_m2} (ref >59)

## 2020-11-06 ENCOUNTER — Telehealth: Payer: Self-pay

## 2020-11-06 NOTE — Telephone Encounter (Signed)
Referral sent to Dr. Karilyn Cota. P: [id number].

## 2020-11-07 ENCOUNTER — Encounter (INDEPENDENT_AMBULATORY_CARE_PROVIDER_SITE_OTHER): Payer: Self-pay | Admitting: *Deleted

## 2020-11-12 ENCOUNTER — Encounter: Payer: Self-pay | Admitting: *Deleted

## 2020-11-12 ENCOUNTER — Telehealth: Payer: Self-pay | Admitting: Neurology

## 2020-11-12 NOTE — Telephone Encounter (Signed)
Called pt. Advised letter ready for pick up. She prefers to have it mailed. I placed in mail for her.

## 2020-11-12 NOTE — Telephone Encounter (Signed)
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 Please advise.

## 2020-11-12 NOTE — Telephone Encounter (Signed)
Letter printed, waiting on MD signature °

## 2021-02-15 ENCOUNTER — Other Ambulatory Visit: Payer: Self-pay | Admitting: Neurology

## 2021-02-18 NOTE — Telephone Encounter (Signed)
Pt is due for a refill on adderall. Pt is up to date on her appts. [place] Controlled Substance Registry checked and is appropriate. 

## 2021-03-11 ENCOUNTER — Ambulatory Visit (INDEPENDENT_AMBULATORY_CARE_PROVIDER_SITE_OTHER): Payer: 59 | Admitting: Gastroenterology

## 2021-03-28 ENCOUNTER — Other Ambulatory Visit: Payer: Self-pay | Admitting: Neurology

## 2021-04-01 NOTE — Telephone Encounter (Signed)
Received refill request for Adderall.  Last OV was on 10/31/20.  Next OV is scheduled for 05/09/21 .  Last RX was written on 02/18/21 for 30 tabs.   [place] Drug Database has been reviewed.

## 2021-04-09 ENCOUNTER — Encounter (INDEPENDENT_AMBULATORY_CARE_PROVIDER_SITE_OTHER): Payer: Self-pay | Admitting: Gastroenterology

## 2021-04-09 ENCOUNTER — Other Ambulatory Visit: Payer: Self-pay

## 2021-04-09 ENCOUNTER — Ambulatory Visit (INDEPENDENT_AMBULATORY_CARE_PROVIDER_SITE_OTHER): Payer: 59 | Admitting: Gastroenterology

## 2021-04-09 VITALS — BP 130/83 | HR 91 | Temp 98.1°F | Ht 66.0 in | Wt 187.9 lb

## 2021-04-09 DIAGNOSIS — K580 Irritable bowel syndrome with diarrhea: Secondary | ICD-10-CM | POA: Diagnosis not present

## 2021-04-09 DIAGNOSIS — R110 Nausea: Secondary | ICD-10-CM | POA: Diagnosis not present

## 2021-04-09 NOTE — Patient Instructions (Signed)
I suspect that your symptoms are likely related to your IBS being exacerbated by the [name]. We will hold off on stool studies or procedures at this time, since these are not new issues for you. If you begin having abdominal pain, more frequent nausea/vomiting/diarrhea, weight loss, changes in your appetite, blood in your stools or black stools, please let me know.  You can try famotidine (pepcid) 20mg  over the counter for times that you have acid reflux, if this starts to occur more often than a few times per month, please let me know.   If you find that imodium is not working for you, reach out to me and we can try one of the medications discussed at your visit.  Follow up as needed

## 2021-04-09 NOTE — Progress Notes (Signed)
Referring Provider: Britt Bottom, MD Primary Care Physician:  Redmond School, MD Primary GI Physician: Laural Golden  Chief Complaint  Patient presents with   Nausea    Patient here today with complaints of nausea, which she states has resolved since the initial referral. She thinks this was caused due to her MS medication.    HPI:   Lindsey Phelps is a 40 y.o. female with past medical history of MS, IBS, anxiety, high cholesterol, HTN.  Patient presenting today as new patient referred by Dr. Felecia Shelling for nausea.  Patient has history of IBS, previously on bentyl, however, she is currently not on any medication for her IBS. She reports that starting in May or June, she began having constant nausea that was daily for about 1 month. She reports that she never had any vomiting. Nausea is not intermittent, She has phenergan from her neurologist which she takes as needed, maybe 2-3x/month.She started a new medication for her MS about 1 year ago ([name]) and started having more diarrhea and nausea since taking the medication. She reports that she has really runny diarrhea about 3x/month that is yellow/green and sometimes greasy looking,  she takes 2-3 imodium a couple of times per month when diarrhea flares up. She does endorse some issues with heartburn/acid reflux, she takes tums for a few days when this flares up which seems to help, typically this occurs maybe 1 week out of the month. She denies any abdominal pain, early satiety, she denies weight loss. Denies blood in stools or black stools.   Diarrhea is usually yellow/green and sometimes greasy, she can have up to 3-4 episodes of diarrhea per day. She will sometimes go 2-3 days without a BM, she has solid stools in between the episodes of diarrhea. She does endorse some lower abdominal pain when she has episodes of diarrhea, this is relieved after having a BM, this occurs.   NSAID use: occasional ibuprofen Social hx: no etoh or tobacco Fam  [id number] had colon polyps, unsure what time, no CRC or IBD  Last Colonoscopy:01/23/11 Normal terminal ileum. Normal colonoscopy. She possibly had self-limiting enterocolitis which would account for CT findings. Current symptoms  secondary to irritable bowel syndrome. Last Endoscopy:n/a  Recommendations: Colonoscopy at age 40  Past Medical History:  Diagnosis Date   Anxiety    Cold    Heart palpitations    High cholesterol    History of IBS    Hypertension    Multiple sclerosis ([place])    Stroke [facility])     Past Surgical History:  Procedure Laterality Date   ABDOMINAL HYSTERECTOMY     BLADDER NECK SUSPENSION     CHOLECYSTECTOMY     COLONOSCOPY  01/23/2011   Procedure: COLONOSCOPY;  Surgeon: Rogene Houston, MD;  [facility];  Service: Endoscopy;  Laterality: N/A;   LAPAROSCOPIC TUBAL LIGATION     TEE WITHOUT CARDIOVERSION N/A 05/05/2017   Procedure: TRANSESOPHAGEAL ECHOCARDIOGRAM (TEE);  Surgeon: Herminio Commons, MD;  Location: [facility];  Service: Cardiovascular;  Laterality: N/A;    Current Outpatient Medications  Medication Sig Dispense Refill   amphetamine-dextroamphetamine (ADDERALL XR) 10 MG 24 hr capsule TAKE ONE CAPSULE BY MOUTH ONCE DAILY 30 capsule 0   [name] 14 MG TABS TAKE 1 TABLET DAILY 30 tablet 11   cetirizine-pseudoephedrine (ZYRTEC-D) 5-120 MG tablet Take 1 tablet by mouth 2 (two) times daily.     cyclobenzaprine (FLEXERIL) 5 MG tablet Take up to 3 pills daily as  needed 90 tablet 5   diazepam (VALIUM) 10 MG tablet Take 10 mg by mouth at bedtime as needed for anxiety.      ibuprofen (ADVIL) 200 MG tablet Take 200 mg by mouth every 6 (six) hours as needed. Up to 3 as needed     promethazine (PHENERGAN) 25 MG tablet Take 1 tablet (25 mg total) by mouth 2 (two) times daily as needed for nausea or vomiting. 60 tablet 5   No current facility-administered medications for this visit.   Facility-Administered Medications Ordered in Other Visits   Medication Dose Route Frequency Provider Last Rate Last Admin   gadopentetate dimeglumine (MAGNEVIST) injection 18 mL  18 mL Intravenous Once PRN Sater, Nanine Means, MD       gadopentetate dimeglumine (MAGNEVIST) injection 18 mL  18 mL Intravenous Once PRN Sater, Nanine Means, MD        Allergies as of 04/09/2021 - Review Complete 04/09/2021  Allergen Reaction Noted   Darvocet [propoxyphene n-acetaminophen] Hives 10/30/2010   Demerol Hives 10/30/2010   Adhesive [tape] Rash and Other (See Comments) 09/15/2013    Family History  Problem Relation Age of Onset   Diabetes Father    Hypertension Father    Colon polyps Father    Lung cancer Father    Hypertension Mother    Coronary artery disease Mother    COPD Mother     Social History   Socioeconomic History   Marital status: Married    Spouse name: Not on file   Number of children: Not on file   Years of education: Not on file   Highest education level: Not on file  Occupational History   Occupation: on disability  Tobacco Use   Smoking status: Former    Packs/day: 0.50    Years: 15.00    Pack years: 7.50    Types: Cigarettes    Quit date: 09/16/2012    Years since quitting: 8.5   Smokeless tobacco: Never  Vaping Use   Vaping Use: Never used  Substance and Sexual Activity   Alcohol use: Yes    Alcohol/week: 1.0 standard drink    Types: 1 Shots of liquor per week    Comment: every few months   Drug use: No   Sexual activity: Yes    Birth control/protection: Surgical  Other Topics Concern   Not on file  Social History Narrative   Not on file   Social Determinants of Health   Financial Resource Strain: Not on file  Food Insecurity: Not on file  Transportation Needs: Not on file  Physical Activity: Not on file  Stress: Not on file  Social Connections: Not on file   Review of systems General: negative for malaise, night sweats, fever, chills, weight loss Neck: Negative for lumps, goiter, pain and significant  neck swelling Resp: Negative for cough, wheezing, dyspnea at rest CV: Negative for chest pain, leg swelling, palpitations, orthopnea GI: denies melena, hematochezia, vomiting,  constipation, dysphagia, odyonophagia, early satiety or unintentional weight loss. +nausea +diarrhea MSK: Negative for joint pain or swelling, back pain, and muscle pain. Derm: Negative for itching or rash Psych: Denies depression, anxiety, memory loss, confusion. No homicidal or suicidal ideation.  Heme: Negative for prolonged bleeding, bruising easily, and swollen nodes. Endocrine: Negative for cold or heat intolerance, polyuria, polydipsia and goiter. Neuro: negative for tremor, gait imbalance, syncope and seizures. The remainder of the review of systems is noncontributory.  Physical Exam: BP 130/83 (BP Location: Left Arm, Patient Position: Sitting,  Cuff Size: Large)   Pulse 91   Temp 98.1 F (36.7 C) (Oral)   Ht 5\' 6"  (1.676 m)   Wt 187 lb 14.4 oz (85.2 kg)   BMI 30.33 kg/m  General:   Alert and oriented. No distress noted. Pleasant and cooperative.  Head:  Normocephalic and atraumatic. Eyes:  Conjuctiva clear without scleral icterus. Mouth:  Oral mucosa pink and moist. Good dentition. No lesions. Heart: Normal rate and rhythm, s1 and s2 heart sounds present.  Lungs: Clear lung sounds in all lobes. Respirations equal and unlabored. Abdomen:  +BS, soft, non-tender and non-distended. No rebound or guarding. No HSM or masses noted. Derm: No palmar erythema or jaundice Msk:  Symmetrical without gross deformities. Normal posture. Extremities:  Without edema. Neurologic:  Alert and  oriented x4 Psych:  Alert and cooperative. Normal mood and affect.  Invalid input(s): 6 MONTHS   ASSESSMENT: Lindsey Phelps is a 40 y.o. female presenting today for nausea and diarrhea.   Patient has hx of IBS, has also had cholecystectomy. She has had diarrhea off and on for many years now related to her IBS, however, she  began 46 about a year ago for her MS and reports that nausea began and diarrhea has worsened some. She had constant, daily nausea back in may/June that lasted about 1 month, now nausea is intermittent, she suspects it is related to her [name] as this is a known side effect. She does not currently take any Rx for her IBS, she uses imodium as needed, maybe a few times per month, also has phenergan that she takes 2-3x/month with good results. She denies vomiting. She denies any red flag symptoms. I suspect that her IBS is exacerbated by the [name] and likely some component of her Adderall as well, she could also have some underlying bile acid diarrhea. Patient is reluctant to try any new medications at this time, as she does not want to risk any further side effects, which I think is reasonable, I am reluctant to start her on bentyl given her hx of MS and neurologic medications she is on, we can try colestid in the future if patient feels that imodium as needed is not working for her. I have a very low suspicion for infectious etiology at this time, given her hx of IBS and timeline of symptom exacerbation with new medication. We will hold off on stool studies. She is having no weight loss, early satiety, dysphagia, changes in appetite, blood in stools or melena. We will hold off on diagnostic procedures at this time, however, patient will make me aware if she develops any new or worsening GI symptoms.   PLAN:  Otc pepcid 20mg  as needed 2. Patient to let me know if she develops new or worsening symptoms 3. Can consider colestid if patient decides she would like to try medication for her diarrhea   Follow Up: PRN  Tyshay Adee L. [place], MSN, APRN, AGNP-C Adult-Gerontology Nurse Practitioner [facility]

## 2021-05-09 ENCOUNTER — Encounter: Payer: Self-pay | Admitting: Neurology

## 2021-05-09 ENCOUNTER — Ambulatory Visit: Payer: Managed Care, Other (non HMO) | Admitting: Neurology

## 2021-05-09 VITALS — BP 121/82 | HR 80 | Ht 66.0 in | Wt 183.0 lb

## 2021-05-09 DIAGNOSIS — F09 Unspecified mental disorder due to known physiological condition: Secondary | ICD-10-CM

## 2021-05-09 DIAGNOSIS — R269 Unspecified abnormalities of gait and mobility: Secondary | ICD-10-CM

## 2021-05-09 DIAGNOSIS — R110 Nausea: Secondary | ICD-10-CM

## 2021-05-09 DIAGNOSIS — Z79899 Other long term (current) drug therapy: Secondary | ICD-10-CM

## 2021-05-09 DIAGNOSIS — G35 Multiple sclerosis: Secondary | ICD-10-CM

## 2021-05-09 DIAGNOSIS — R200 Anesthesia of skin: Secondary | ICD-10-CM

## 2021-05-09 DIAGNOSIS — R5383 Other fatigue: Secondary | ICD-10-CM

## 2021-05-09 MED ORDER — AMPHETAMINE-DEXTROAMPHET ER 10 MG PO CP24: 10.0000 mg | ORAL_CAPSULE | Freq: Every day | ORAL | 0 refills | Status: DC

## 2021-05-09 NOTE — Progress Notes (Signed)
I reached out to Angelique (contact w/ MS one to one) about pt questions regarding [name]/copay/debit card. Received the following response back via email:  "I am not sure who the patient has been speaking with as we have not spoken with the patient since 03/05/2020. It may be questions she has to resolve with the SPP in regards to her debit card/copay card, and how that works with them. Pt is currently active with Co-pay assistance, and we are covering her co-pay at 100%. I know the SPP's do require the pt to have a debit card on file even though our co-pay card is covering the [name] copay. I know this could be confusing to the pt not understanding why they need the debit card when our copay card is already covering her [name] copy but that is the SPP's policy.  I am going to have one of our MS Nurses reach out to the patient to go over the CPA card, and answer any questions the patient may have to hopefully resolve her inquiry. If not our nurse can direct her back to the [facility] for them to answer anything on their end. I hope this helps. Please let me know if you need anything else. Thank you, Angelique"

## 2021-05-09 NOTE — Progress Notes (Addendum)
[facility]  PATIENT: Lindsey Phelps DOB: 1981-03-14  REFERRING DOCTOR OR PCP:  Elfredia Nevins SOURCE: Patient, notes from Dr. Sherwood Gambler, laboratory results.  _________________________________   HISTORICAL  CHIEF COMPLAINT:  Chief Complaint  Patient presents with   Follow-up    RM 1, alone. Last seen 10/31/2020. Ms- on Ashok Cordia. Missed taking [name] month of Feb d/t being sick the whole month. Aware to contact MS one to one to discuss copay/debit card for payment. Aware we will handle re-auth for new year and to make us/MS one to one aware if insurance changes at all.    Rash    Noticed rash on arms last couple weeks. No change in shampoo/soap. Itching to point where she is bruising herself/not sleep well.    Medication Refill    Needs refill on adderall    HISTORY OF PRESENT ILLNESS:  Lindsey Phelps is a 40 y.o. woman with relapsing remiting MS with multiple tumefactive lesions.  Update 05/09/2021 She is on [name] .  She notes nausea that might be related.  She has not had any definite exacerbation but has noted some more symptoms..     She notes a strange sensation in her head and feels this also happened around the time of diagnosis.    She also feels STM is worse, despite Adderall (though worse without it).   She also note an internal vibration sensation and numbness in her face.    She notes a lot of other symptoms.    She notes some neck pain on the left side.   She has noted pain in her eys bilaterally as well.    She has had a rash on her arms the last 2 weeks.     She has tingling and dysesthesias in the legs, right greater than left.  She also notes some tingling in the hands.    NCV/EMG 07/19/2019 did not show any neuropathy or evidence of radiculopathy.   Steroids helped once for her right leg but other times more recently  has nor helped  She has residual mild right leg weakness affecting gait at times.   Speech is usually good though some word  finding difficulties at times and reduced STM/focus.   As an example, she needs reminders to take hr medications.   She takes 1/2 phentermine daily with some benefit.     Valium at night helps her sleep.     She has some anxiety and agoraphobia.     She has had issues with cognition.   She is now on disability.   She saw an occupational doctor and psychology for testing.     She was told she needs someone else to handle finances, though she has never had diffiulty with that.  She tries to stay busy and raises a few chickens to stay more active.     She has eczema, always worse in winter.   She plans on seeing dermatology.    .    MS History: In 03/31/2017 she experienced word finding difficulties and an MRI of the brain was performed concerning for a subacute left frontal stroke. There were a few other punctate T2/FLAIR hyperintense foci consistent with minimal chronic microvascular ischemic change.  Stroke work-up was performed.  I was most concerned about an embolic etiology. The carotid ultrasound was essentially normal. The cardiac event monitor was essentially normal.  A TEE showed a normal ejection fraction of 60-65 percent.   There was no evidence of a  patent foramen ovale or other right-to-left shunt. The study was felt to be normal.  She is on aspirin, metoprolol and Crestor.    A subsequent MRI 07/06/2017 showed multiple areas of juxtacortical edema and enhancement, most consistent with demyelination.  A biopsy was considered but due to his improvement on the subsequent MRI, more consistent with demyelination then tumor, this was held.  She was started on Tysabri 07/2017.  MRI 11/10/2017 was stable.  Due to tolerability issues, she stopped May 2020 and started on [name].  Imaging: MRI of the brain 04/07/2017 showed a partially enhancing focus in the left frontal lobe.  There were a couple punctate T2/FLAIR hyperintense foci elsewhere that were not nonspecific.  MRI of the brain 07/06/2017 showed  multiple juxtacortical and periventricular enhancing lesions.  MRI of the brain 07/28/2017 showed improvement of several lesions noted on the previous MRI though one of the foci was larger.  MRI of the brain 11/10/2017 showed no new lesions and resolution of the enhancement seen on the previous MRI  MRI of the brain 03/03/2018 showed no new lesions  MRI of the brain 11/03/2018 showed multiple juxtacortical and periventricular white matter foci.  None of these were new.  They do not enhance.  MRI of the cervical and thoracic spine 11/03/2018 showed a normal spinal cord.  MRI of the brain 05/10/2019 showed no new lesions.  MRI of the brain 05/09/2020 showed multiple T2/FLAIR hyperintense foci, predominantly in the juxtacortical white matter.  None of the foci enhances or appears to be acute.  Compared to the MRI dated 05/10/2019, there are no new lesions.     REVIEW OF SYSTEMS: Constitutional: No fevers, chills, sweats, or change in appetite.   She has mild sleep onset insomnia Eyes: No visual changes, double vision, eye pain Ear, nose and throat: No hearing loss, ear pain, nasal congestion, sore throat Cardiovascular: No chest pain.  Has intermittent tachycardia.  Respiratory:  No shortness of breath at rest or with exertion.   No wheezes GastrointestinaI: No nausea, vomiting, diarrhea, abdominal pain, fecal incontinence Genitourinary:  No dysuria, urinary retention or frequency.  No nocturia. Musculoskeletal:  No neck pain, back pain Integumentary: No rash, pruritus, skin lesions Neurological: as above Psychiatric: No depression at this time.  H/o panic attacks. Endocrine: No palpitations, diaphoresis, change in appetite, change in weigh or increased thirst Hematologic/Lymphatic:  No anemia, purpura, petechiae. Allergic/Immunologic: has some seasonal allergies  ALLERGIES: Allergies  Allergen Reactions   Darvocet [Propoxyphene N-Acetaminophen] Hives   Demerol Hives   Adhesive [Tape] Rash  and Other (See Comments)    Including bandaids - takes skin off, peels welts    HOME MEDICATIONS:  Current Outpatient Medications:    [name] 14 MG TABS, TAKE 1 TABLET DAILY, Disp: 30 tablet, Rfl: 11   cetirizine-pseudoephedrine (ZYRTEC-D) 5-120 MG tablet, Take 1 tablet by mouth 2 (two) times daily., Disp: , Rfl:    cyclobenzaprine (FLEXERIL) 5 MG tablet, Take up to 3 pills daily as needed, Disp: 90 tablet, Rfl: 5   diazepam (VALIUM) 10 MG tablet, Take 10 mg by mouth at bedtime as needed for anxiety. , Disp: , Rfl:    diphenhydrAMINE (BENADRYL ALLERGY) 25 MG tablet, Take 25 mg by mouth every 6 (six) hours as needed., Disp: , Rfl:    ibuprofen (ADVIL) 200 MG tablet, Take 200 mg by mouth every 6 (six) hours as needed. Up to 3 as needed, Disp: , Rfl:    Loperamide HCl (IMODIUM PO), Take 1 Dose by  mouth as needed., Disp: , Rfl:    promethazine (PHENERGAN) 25 MG tablet, Take 1 tablet (25 mg total) by mouth 2 (two) times daily as needed for nausea or vomiting., Disp: 60 tablet, Rfl: 5   amphetamine-dextroamphetamine (ADDERALL XR) 10 MG 24 hr capsule, Take 1 capsule (10 mg total) by mouth daily., Disp: 30 capsule, Rfl: 0 No current facility-administered medications for this visit.  Facility-Administered Medications Ordered in Other Visits:    gadopentetate dimeglumine (MAGNEVIST) injection 18 mL, 18 mL, Intravenous, Once PRN, Romello Hoehn A, MD   gadopentetate dimeglumine (MAGNEVIST) injection 18 mL, 18 mL, Intravenous, Once PRN, Kyan Giannone, Pearletha Furl, MD  PAST MEDICAL HISTORY: Past Medical History:  Diagnosis Date   Anxiety    Cold    Heart palpitations    High cholesterol    History of IBS    Hypertension    Multiple sclerosis (HCC)    Stroke [facility])     PAST SURGICAL HISTORY: Past Surgical History:  Procedure Laterality Date   ABDOMINAL HYSTERECTOMY     BLADDER NECK SUSPENSION     CHOLECYSTECTOMY     COLONOSCOPY  01/23/2011   Rehman, normal   LAPAROSCOPIC TUBAL LIGATION     TEE  WITHOUT CARDIOVERSION N/A 05/05/2017   Procedure: TRANSESOPHAGEAL ECHOCARDIOGRAM (TEE);  Surgeon: Laqueta Linden, MD;  [facility];  Service: Cardiovascular;  Laterality: N/A;    FAMILY HISTORY: Family History  Problem Relation Age of Onset   Diabetes Father    Hypertension Father    Colon polyps Father    Lung cancer Father    Hypertension Mother    Coronary artery disease Mother    COPD Mother     SOCIAL HISTORY:  Social History   Socioeconomic History   Marital status: Married    Spouse name: Not on file   Number of children: Not on file   Years of education: Not on file   Highest education level: Not on file  Occupational History   Occupation: on disability  Tobacco Use   Smoking status: Former    Packs/day: 0.50    Years: 15.00    Pack years: 7.50    Types: Cigarettes    Quit date: 09/16/2012    Years since quitting: 8.6   Smokeless tobacco: Never  Vaping Use   Vaping Use: Never used  Substance and Sexual Activity   Alcohol use: Yes    Alcohol/week: 1.0 standard drink    Types: 1 Shots of liquor per week    Comment: every few months   Drug use: No   Sexual activity: Yes    Birth control/protection: Surgical  Other Topics Concern   Not on file  Social History Narrative   Not on file   Social Determinants of Health   Financial Resource Strain: Not on file  Food Insecurity: Not on file  Transportation Needs: Not on file  Physical Activity: Not on file  Stress: Not on file  Social Connections: Not on file  Intimate Partner Violence: Not on file     PHYSICAL EXAM  Vitals:   05/09/21 1413  BP: 121/82  Pulse: 80  SpO2: 99%  Weight: 183 lb (83 kg)  Height: 5\' 6"  (1.676 m)    Body mass index is 29.54 kg/m.   General: The patient is well-developed and well-nourished and in no acute distress.   Has only mild right occipital tenderness now, much better than last visit. Neurologic Exam  Mental status: She is alert and oriented  x 3 at the time of the examination. The patient has apparent normal recent and remote memory, with an apparently normal attention span and concentration ability.  She had no noticeable speech issue during today's visit.  Cranial nerves:   Color vision is symmetric.  Color vision is mildly desaturated OD.  Extraocular movements are full.  Facial strength and sensation was normal.. No obvious hearing deficits are noted.  Motor:  Muscle bulk is normal.   Tone is normal.  Strength 5/5.  I Sensory:   She has mildly reduced touch and vibration on right arm and leg compared to left  Coordination: Cerebellar testing reveals good finger-nose-finger and left heel-to-shin.  She has reduced right heel-to-shin.  Gait and station: Station is normal.  The gait is mildly wide.  Tandem gait is wide.  Romberg is negative.   Reflexes: Deep tendon reflexes are symmetric and slightly increased in the arms, increased at the knees with crossed adductors, right >left.  She does not have ankle clonus.        DIAGNOSTIC DATA (LABS, IMAGING, TESTING) - I reviewed patient records, labs, notes, testing and imaging myself where available.  Lab Results  Component Value Date   WBC 5.2 10/31/2020   HGB 12.9 10/31/2020   HCT 40.2 10/31/2020   MCV 92 10/31/2020   PLT 256 10/31/2020      Component Value Date/Time   NA 140 10/31/2020 1204   K 4.5 10/31/2020 1204   CL 100 10/31/2020 1204   CO2 24 10/31/2020 1204   GLUCOSE 85 10/31/2020 1204   GLUCOSE 85 03/03/2018 1736   BUN 15 10/31/2020 1204   CREATININE 0.77 10/31/2020 1204   CALCIUM 9.6 10/31/2020 1204   PROT 6.5 10/31/2020 1204   ALBUMIN 4.7 10/31/2020 1204   AST 26 10/31/2020 1204   ALT 21 10/31/2020 1204   ALKPHOS 72 10/31/2020 1204   BILITOT 0.7 10/31/2020 1204   GFRNONAA 93 04/30/2020 1147   GFRAA 107 04/30/2020 1147       ASSESSMENT AND PLAN  1. Cognitive deficit secondary to MS (HCC)   2. Multiple sclerosis (HCC)   3. Other fatigue   4.  Gait disturbance   5. High risk medication use   6. Numbness   7. Nausea      1.   Continue [name].  We will check some lab work.  Check MRI of the brain to determine if there is any subclinical progression.  If present we will need to switch to a different disease modifying therapy for more efficacy. 2.   Continue Flexeril as needed 3.  Continue Adderall XR 10 milligrams.  She had trouble tolerating higher doses. 4.   She is on disability.  Although she has impairments that prevent her from working a job that are both physical and cognitive, I believe she is able to handle her finances. 5.   Return to see me in 6 months or sooner if there are new or worsening neurologic symptoms.      Astella Desir A. Epimenio Foot, MD, PhD, FAAN Certified in Neurology, Clinical Neurophysiology, Sleep Medicine, Pain Medicine and Neuroimaging Director, [facility] S[facility] at [facility]  [facility] [address], [address] [place], [place] [address] [phone]

## 2021-05-16 ENCOUNTER — Telehealth: Payer: Self-pay | Admitting: Neurology

## 2021-05-16 NOTE — Telephone Encounter (Signed)
MR Brain w/wo contrast Dr. Fredonia Highland: [id number] (Exp. 05/16/21 to 11/12/21). Patient is scheduled at [facility] for 05/22/21.

## 2021-05-22 ENCOUNTER — Ambulatory Visit (INDEPENDENT_AMBULATORY_CARE_PROVIDER_SITE_OTHER): Payer: Managed Care, Other (non HMO)

## 2021-05-22 ENCOUNTER — Other Ambulatory Visit: Payer: Self-pay

## 2021-05-22 DIAGNOSIS — F09 Unspecified mental disorder due to known physiological condition: Secondary | ICD-10-CM

## 2021-05-22 DIAGNOSIS — G35 Multiple sclerosis: Secondary | ICD-10-CM

## 2021-05-22 MED ORDER — GADOBENATE DIMEGLUMINE 529 MG/ML IV SOLN
7.0000 mL | Freq: Once | INTRAVENOUS | Status: AC | PRN
  Administered 2021-05-22: 14:00:00 7 mL via INTRAVENOUS

## 2021-07-15 ENCOUNTER — Telehealth: Payer: Self-pay

## 2021-07-15 NOTE — Telephone Encounter (Signed)
Received PA request for aubagio. Initiated via CMM. Waiting on clinical question set from [facility]. Key: [id number].

## 2021-07-16 NOTE — Telephone Encounter (Signed)
I called [employer]. I completed PA for aubagio via phone. Call > 35 mins. PA for Ashok Cordia was approved by [place] from 07/16/2021-07/16/2022. Case ID: [id number]. MS [facility] to One was informed.

## 2021-07-22 ENCOUNTER — Telehealth: Payer: Self-pay | Admitting: *Deleted

## 2021-07-22 NOTE — Telephone Encounter (Signed)
Gave completed/signed physical ability assessment form back to medical records to process for pt.

## 2021-07-23 NOTE — Telephone Encounter (Signed)
I faxed pt long term form to [employer] life on 07/23/21 to [phone]

## 2021-08-06 ENCOUNTER — Telehealth: Payer: Self-pay | Admitting: Neurology

## 2021-08-06 DIAGNOSIS — G35 Multiple sclerosis: Secondary | ICD-10-CM

## 2021-08-06 MED ORDER — AUBAGIO 14 MG PO TABS: 1.0000 | ORAL_TABLET | Freq: Every day | ORAL | 5 refills | Status: DC

## 2021-08-06 NOTE — Telephone Encounter (Signed)
Pt request refill for [name] 14 MG TABS at [facility]. Pt is out of medication. ?

## 2021-08-06 NOTE — Telephone Encounter (Signed)
I have sent the refill for the pt to [facility].  ?

## 2021-08-08 ENCOUNTER — Telehealth: Payer: Self-pay | Admitting: Neurology

## 2021-08-08 NOTE — Telephone Encounter (Signed)
Paper work was placed in [employer] today. I have completed and will have Dr Epimenio Foot look over and review and we will send in for the patient.  ?

## 2021-08-08 NOTE — Telephone Encounter (Signed)
Dr. Domenica Fail called wanting to know if his fax was received from yesterday. He would like to speak to the provider regarding the pt's Functional Status due to the pt filing for Disability. Please advise. ?

## 2021-08-27 ENCOUNTER — Other Ambulatory Visit: Payer: Self-pay | Admitting: Neurology

## 2021-08-27 MED ORDER — AMPHETAMINE-DEXTROAMPHET ER 10 MG PO CP24: 10.0000 mg | ORAL_CAPSULE | Freq: Every day | ORAL | 0 refills | Status: DC

## 2021-08-27 NOTE — Telephone Encounter (Signed)
Pt request refill for amphetamine-dextroamphetamine (ADDERALL XR) 10 MG 24 hr capsule at [facility] ?

## 2021-08-27 NOTE — Telephone Encounter (Signed)
Last OV was on 05/09/21.  ?Next OV is scheduled for 11/14/21 .  ?Last RX was written on 05/09/21 for 30 tabs.  ? ?[place] [facility] Database has been reviewed.  ?

## 2021-09-02 ENCOUNTER — Ambulatory Visit: Payer: Managed Care, Other (non HMO) | Admitting: Neurology

## 2021-09-02 ENCOUNTER — Encounter: Payer: Self-pay | Admitting: Neurology

## 2021-09-02 ENCOUNTER — Telehealth: Payer: Self-pay | Admitting: Neurology

## 2021-09-02 VITALS — BP 142/90 | HR 84 | Ht 66.0 in | Wt 192.0 lb

## 2021-09-02 DIAGNOSIS — R5383 Other fatigue: Secondary | ICD-10-CM

## 2021-09-02 DIAGNOSIS — Z79899 Other long term (current) drug therapy: Secondary | ICD-10-CM

## 2021-09-02 DIAGNOSIS — G35 Multiple sclerosis: Secondary | ICD-10-CM

## 2021-09-02 DIAGNOSIS — F09 Unspecified mental disorder due to known physiological condition: Secondary | ICD-10-CM

## 2021-09-02 DIAGNOSIS — M542 Cervicalgia: Secondary | ICD-10-CM

## 2021-09-02 DIAGNOSIS — G4489 Other headache syndrome: Secondary | ICD-10-CM

## 2021-09-02 DIAGNOSIS — R269 Unspecified abnormalities of gait and mobility: Secondary | ICD-10-CM

## 2021-09-02 DIAGNOSIS — G379 Demyelinating disease of central nervous system, unspecified: Secondary | ICD-10-CM

## 2021-09-02 NOTE — Progress Notes (Signed)
? ?[facility] ? ?PATIENT: Lindsey Phelps ?DOB: 1981/05/10 ? ?REFERRING DOCTOR OR PCP:  Elfredia Nevins ?SOURCE: Patient, notes from Dr. Sherwood Gambler, laboratory results. ? ?_________________________________ ? ? ?HISTORICAL ? ?CHIEF COMPLAINT:  ?Chief Complaint  ?Patient presents with  ? Follow-up  ?  Rm 2, w brother. Pt having head pn and spasms on R posterior head. Vibration/ electrical, not so much pn. Ongoing since last week. Feeling exhausted.   ? ? ?HISTORY OF PRESENT ILLNESS:  ?Lindsey Phelps is a 41 y.o. woman with relapsing remiting MS with multiple tumefactive lesions. ? ?Update 09/02/2021: ?She has right occipital tenderness, similar to pain she experienced in 2019.  The pain was helped by occipital nerve blocks/trigger point injections.   ? ?She is on [name].  She has some  nausea, better than initially.  She has taken phenergan a few times.  . She has not had any definite exacerbation but has noted some more symptoms..    ? ?She notes a lot of other symptoms.    She notes some neck pain on the left side.   She has noted pain in her eys bilaterally as well.    She has had a rash on her arms the last 2 weeks.   She has an internal vibration sensation.   ? ?She has tingling and dysesthesias in the legs, right greater than left.  She has more pain and tingling numbness in the hands.    NCV/EMG 07/19/2019 did not show any neuropathy or evidence of radiculopathy.   Steroids helped once for her right leg but other times more recently  has nor helped ? ?She had one episode of fecal incontinence.  Some urinary urgency but no incontinence.  ? ?She has residual mild right leg weakness affecting gait at times since the 2019 episode   She has word finding difficulties at times and reduced STM/focus.      Adderall has helped some.  She has had issues with cognition.   She is now on disability.   She saw an occupational doctor and psychology for testing.     She was told she needs someone else to handle  finances, though she has never had diffiulty with that.  She tries to stay busy and raises a few chickens to stay more active.     She has had some panic attack sensations the last few weeks.   She feels more apathetic.   Valium at night helps her sleep.     She has some anxiety and agoraphobia.    ? ?She has eczema and sees dermatology.    ?.   ? ?MS History: ?In 03/31/2017 she experienced word finding difficulties and an MRI of the brain was performed concerning for a subacute left frontal stroke. There were a few other punctate T2/FLAIR hyperintense foci consistent with minimal chronic microvascular ischemic change.  Stroke work-up was performed.  I was most concerned about an embolic etiology. The carotid ultrasound was essentially normal. The cardiac event monitor was essentially normal.  A TEE showed a normal ejection fraction of 60-65 percent.   There was no evidence of a patent foramen ovale or other right-to-left shunt. The study was felt to be normal.  She is on aspirin, metoprolol and Crestor.    A subsequent MRI 07/06/2017 showed multiple areas of juxtacortical edema and enhancement, most consistent with demyelination.  A biopsy was considered but due to his improvement on the subsequent MRI, more consistent with demyelination then tumor, this was  held.  She was started on Tysabri 07/2017.  MRI 11/10/2017 was stable.  Due to tolerability issues, she stopped May 2020 and started on [name]. ? ?Imaging: ?MRI of the brain 04/07/2017 showed a partially enhancing focus in the left frontal lobe.  There were a couple punctate T2/FLAIR hyperintense foci elsewhere that were not nonspecific. ? ?MRI of the brain 07/06/2017 showed multiple juxtacortical and periventricular enhancing lesions. ? ?MRI of the brain 07/28/2017 showed improvement of several lesions noted on the previous MRI though one of the foci was larger. ? ?MRI of the brain 11/10/2017 showed no new lesions and resolution of the enhancement seen on the previous  MRI ? ?MRI of the brain 03/03/2018 showed no new lesions ? ?MRI of the brain 11/03/2018 showed multiple juxtacortical and periventricular white matter foci.  None of these were new.  They do not enhance. ? ?MRI of the cervical and thoracic spine 11/03/2018 showed a normal spinal cord. ? ?MRI of the brain 05/10/2019 showed no new lesions. ? ?MRI of the brain 05/09/2020 showed multiple T2/FLAIR hyperintense foci, predominantly in the juxtacortical white matter.  None of the foci enhances or appears to be acute.  Compared to the MRI dated 05/10/2019, there are no new lesions.  ? ? ? ?REVIEW OF SYSTEMS: ?Constitutional: No fevers, chills, sweats, or change in appetite.   She has mild sleep onset insomnia ?Eyes: No visual changes, double vision, eye pain ?Ear, nose and throat: No hearing loss, ear pain, nasal congestion, sore throat ?Cardiovascular: No chest pain.  Has intermittent tachycardia.  ?Respiratory:  No shortness of breath at rest or with exertion.   No wheezes ?GastrointestinaI: No nausea, vomiting, diarrhea, abdominal pain, fecal incontinence ?Genitourinary:  No dysuria, urinary retention or frequency.  No nocturia. ?Musculoskeletal: Neck pain as above.   ?Integumentary: No rash, pruritus, skin lesions ?Neurological: as above ?Psychiatric: No depression at this time.  H/o panic attacks. ?Endocrine: No palpitations, diaphoresis, change in appetite, change in weigh or increased thirst ?Hematologic/Lymphatic:  No anemia, purpura, petechiae. ?Allergic/Immunologic: has some seasonal allergies ? ?ALLERGIES: ?Allergies  ?Allergen Reactions  ? Darvocet [Propoxyphene N-Acetaminophen] Hives  ? Demerol Hives  ? Adhesive [Tape] Rash and Other (See Comments)  ?  Including bandaids - takes skin off, peels ?welts  ? ? ?HOME MEDICATIONS: ? ?Current Outpatient Medications:  ?  Acetaminophen (TYLENOL PO), Take by mouth as needed., Disp: , Rfl:  ?  amphetamine-dextroamphetamine (ADDERALL XR) 10 MG 24 hr capsule, TAKE ONE CAPSULE BY  MOUTH DAILY, Disp: 30 capsule, Rfl: 0 ?  cetirizine-pseudoephedrine (ZYRTEC-D) 5-120 MG tablet, Take 1 tablet by mouth 2 (two) times daily., Disp: , Rfl:  ?  Cholecalciferol (VITAMIN D3) 1.25 MG (50000 UT) TABS, Take by mouth., Disp: , Rfl:  ?  cyclobenzaprine (FLEXERIL) 5 MG tablet, Take up to 3 pills daily as needed, Disp: 90 tablet, Rfl: 5 ?  diazepam (VALIUM) 10 MG tablet, Take 10 mg by mouth at bedtime as needed for anxiety. , Disp: , Rfl:  ?  diphenhydrAMINE (BENADRYL) 25 MG tablet, Take 25 mg by mouth every 6 (six) hours as needed., Disp: , Rfl:  ?  ibuprofen (ADVIL) 200 MG tablet, Take 200 mg by mouth every 6 (six) hours as needed. Up to 3 as needed, Disp: , Rfl:  ?  lactase (LACTAID) 3000 units tablet, Take 9,000 Units by mouth with breakfast, with lunch, and with evening meal., Disp: , Rfl:  ?  levocetirizine (XYZAL) 5 MG tablet, Take 5 mg by mouth every  evening., Disp: , Rfl:  ?  Loperamide HCl (IMODIUM PO), Take 1 Dose by mouth as needed., Disp: , Rfl:  ?  promethazine (PHENERGAN) 25 MG tablet, Take 1 tablet (25 mg total) by mouth 2 (two) times daily as needed for nausea or vomiting., Disp: 60 tablet, Rfl: 5 ?  Teriflunomide ([name]) 14 MG TABS, Take 1 tablet by mouth daily., Disp: 30 tablet, Rfl: 5 ?  halobetasol (ULTRAVATE) 0.05 % cream, Apply 1 application. topically 2 (two) times daily., Disp: , Rfl:  ?No current facility-administered medications for this visit. ? ?Facility-Administered Medications Ordered in Other Visits:  ?  gadopentetate dimeglumine (MAGNEVIST) injection 18 mL, 18 mL, Intravenous, Once PRN, Algie Cales A, MD ?  gadopentetate dimeglumine (MAGNEVIST) injection 18 mL, 18 mL, Intravenous, Once PRN, Bosco Paparella, Pearletha Furl, MD ? ?PAST MEDICAL HISTORY: ?Past Medical History:  ?Diagnosis Date  ? Anxiety   ? Cold   ? Heart palpitations   ? High cholesterol   ? History of IBS   ? Hypertension   ? Multiple sclerosis (HCC)   ? Stroke [facility])   ? ? ?PAST SURGICAL HISTORY: ?Past Surgical History:   ?Procedure Laterality Date  ? ABDOMINAL HYSTERECTOMY    ? BLADDER NECK SUSPENSION    ? CHOLECYSTECTOMY    ? COLONOSCOPY  01/23/2011  ? Rehman, normal  ? LAPAROSCOPIC TUBAL LIGATION    ? TEE WITHOUT CARDIOVER

## 2021-09-02 NOTE — Telephone Encounter (Signed)
Pt said, since last Thursday, spasm right side of head. Have not been to the ER. Would like a call from the nurse to discuss if can be worked in. ?

## 2021-09-02 NOTE — Telephone Encounter (Signed)
Called the pt back. In general felt tired on wed and then next day started having head pain. She said right posterior head pain (which she has had previously). She states that she feels like its electrical shock going down the back of the neck. In the past, Dr Epimenio Foot has given injections which have helped before. She is asking to be seen and determine if she should get that again. I had a opening at 3 pm for today and the pt accepted. ?

## 2021-09-04 ENCOUNTER — Other Ambulatory Visit: Payer: Self-pay | Admitting: Neurology

## 2021-09-04 LAB — COMPREHENSIVE METABOLIC PANEL
ALT: 44 IU/L — ABNORMAL HIGH (ref 0–32)
AST: 46 IU/L — ABNORMAL HIGH (ref 0–40)
Albumin/Globulin Ratio: 1.9 (ref 1.2–2.2)
Albumin: 4.6 g/dL (ref 3.8–4.8)
Alkaline Phosphatase: 89 IU/L (ref 44–121)
BUN/Creatinine Ratio: 14 (ref 9–23)
BUN: 9 mg/dL (ref 6–24)
Bilirubin Total: 0.3 mg/dL (ref 0.0–1.2)
CO2: 21 mmol/L (ref 20–29)
Calcium: 9 mg/dL (ref 8.7–10.2)
Chloride: 106 mmol/L (ref 96–106)
Creatinine, Ser: 0.63 mg/dL (ref 0.57–1.00)
Globulin, Total: 2.4 g/dL (ref 1.5–4.5)
Glucose: 85 mg/dL (ref 70–99)
Potassium: 3.9 mmol/L (ref 3.5–5.2)
Sodium: 143 mmol/L (ref 134–144)
Total Protein: 7 g/dL (ref 6.0–8.5)
eGFR: 114 mL/min/{1.73_m2} (ref >59)

## 2021-09-04 LAB — CBC WITH DIFFERENTIAL/PLATELET
Basophils Absolute: 0.1 10*3/uL (ref 0.0–0.2)
Basos: 2 %
EOS (ABSOLUTE): 0.9 10*3/uL — ABNORMAL HIGH (ref 0.0–0.4)
Eos: 16 %
Hematocrit: 41.4 % (ref 34.0–46.6)
Hemoglobin: 14 g/dL (ref 11.1–15.9)
Immature Grans (Abs): 0 10*3/uL (ref 0.0–0.1)
Immature Granulocytes: 0 %
Lymphocytes Absolute: 2.4 10*3/uL (ref 0.7–3.1)
Lymphs: 44 %
MCH: 30 pg (ref 26.6–33.0)
MCHC: 33.8 g/dL (ref 31.5–35.7)
MCV: 89 fL (ref 79–97)
Monocytes Absolute: 0.4 10*3/uL (ref 0.1–0.9)
Monocytes: 7 %
Neutrophils Absolute: 1.7 10*3/uL (ref 1.4–7.0)
Neutrophils: 31 %
Platelets: 233 10*3/uL (ref 150–450)
RBC: 4.66 x10E6/uL (ref 3.77–5.28)
RDW: 12.7 % (ref 11.7–15.4)
WBC: 5.4 10*3/uL (ref 3.4–10.8)

## 2021-09-04 LAB — SEDIMENTATION RATE: Sed Rate: 13 mm/hr (ref 0–32)

## 2021-09-04 LAB — ANTI-MOG, SERUM: MOG Antibody, Cell-based IFA: NEGATIVE

## 2021-09-04 LAB — C-REACTIVE PROTEIN: CRP: 1 mg/L (ref 0–10)

## 2021-09-30 ENCOUNTER — Telehealth: Payer: Self-pay | Admitting: Neurology

## 2021-09-30 ENCOUNTER — Other Ambulatory Visit: Payer: Self-pay | Admitting: *Deleted

## 2021-09-30 DIAGNOSIS — R748 Abnormal levels of other serum enzymes: Secondary | ICD-10-CM

## 2021-09-30 NOTE — Telephone Encounter (Signed)
Called and spoke with pt. She would like to come in for repeat liver function check per Dr. Epimenio Foot recommendation. Scheduled lab appt for 10/01/21 at 1:15pm. Placed order for hepatic function panel. ?

## 2021-09-30 NOTE — Telephone Encounter (Signed)
Pt states she is to get blood work done this month May of 2023, by Dr. Rexene Alberts request.  ?Pt requesting tocom into [facility] office for blood work, and would like a call back to discuss when she can get this done.  ? ?

## 2021-10-01 ENCOUNTER — Other Ambulatory Visit (INDEPENDENT_AMBULATORY_CARE_PROVIDER_SITE_OTHER): Payer: Self-pay

## 2021-10-01 DIAGNOSIS — Z0289 Encounter for other administrative examinations: Secondary | ICD-10-CM

## 2021-10-01 DIAGNOSIS — R748 Abnormal levels of other serum enzymes: Secondary | ICD-10-CM

## 2021-10-02 LAB — HEPATIC FUNCTION PANEL
ALT: 27 IU/L (ref 0–32)
AST: 28 IU/L (ref 0–40)
Albumin: 4.6 g/dL (ref 3.8–4.8)
Alkaline Phosphatase: 104 IU/L (ref 44–121)
Bilirubin Total: 0.3 mg/dL (ref 0.0–1.2)
Bilirubin, Direct: 0.1 mg/dL (ref 0.00–0.40)
Total Protein: 7.4 g/dL (ref 6.0–8.5)

## 2021-10-24 ENCOUNTER — Telehealth: Payer: Self-pay | Admitting: Neurology

## 2021-10-24 DIAGNOSIS — G35 Multiple sclerosis: Secondary | ICD-10-CM

## 2021-10-24 MED ORDER — AUBAGIO 14 MG PO TABS: 1.0000 | ORAL_TABLET | Freq: Every day | ORAL | 5 refills | Status: DC

## 2021-10-24 NOTE — Telephone Encounter (Signed)
Pt has called to report that since she has been on the generic Teriflunomide ([name]) 14 MG TABS, she has experienced more dizziness, feeling of being nauseated  right after eating and her legs feet and ankles being swollen double their size.  Pt states all these things occurred since being on the generic for [name], please call.

## 2021-10-24 NOTE — Telephone Encounter (Signed)
Called pt back. Last refill she got was for generic [name] (teriflunomide). Started first of May 2023.Since being on it, nauseous most days. Every time she eats, she is nauseous. Some stomach pain above belly button area (feels bruised). Also feels more sore all over. Legs/feet are swollen. This was severe last night. Elevated legs all night. Swelling better this morning. Today, moving around caused increased swelling again. She is also more dizzy than her normal.   She called [facility] one to one nurse today about sx as well. She recommended she call her insurance to see if she can get back on brand name [employer], spoke with a Legrand Como. He recommended she contact our office.  I sent in updated prescription asking brand name be dispensed to her. She has about 7 pills of teriflunomide left.   Has appt with PCP 11/05/21 to talk about nausea/leg swelling to make sure to r/o other causes besides this medication.

## 2021-10-25 NOTE — Telephone Encounter (Signed)
Called and LVM relaying Dr. Bonnita Hollow message. Advised her to call back if she has any further questions/concerns.

## 2021-11-14 ENCOUNTER — Ambulatory Visit: Payer: Managed Care, Other (non HMO) | Admitting: Neurology

## 2021-11-27 ENCOUNTER — Encounter: Payer: Self-pay | Admitting: Neurology

## 2021-11-28 NOTE — Telephone Encounter (Signed)
Initiated PA brand [name] on CMM. Key: [id number]. In process of completing.

## 2021-12-26 NOTE — Telephone Encounter (Signed)
Received the following response back on covermymeds: "CaseId:[id number];Status:Cancelled;Explanation:There is a similar open case waiting for completion. Please refer to [id number]"  I called [employer] at [phone] and spoke w/ Lauren. They faxed PA form to [place] 12/23/21 but they faxed to [phone] which I informed was incorrect and she corrected to [phone] for future faxes. I completed PA over the phone. PA approved for brand name [name] 14mg  tablet. #30/30. Effective 11/26/21-12/26/22. Case ID: [id number].    Pt tried/failed: Tysabri (had SE). Went on brand name [name] 11/10/2018. Switched to generic 09/30/21-11/26/21. Had SE on generic. Went back on brand name starting around 11/27/21.

## 2021-12-26 NOTE — Telephone Encounter (Signed)
Initiated PA on covermymeds. Key: [id number]. In process of completing.

## 2022-02-20 ENCOUNTER — Ambulatory Visit: Payer: Managed Care, Other (non HMO) | Admitting: Neurology

## 2022-02-20 ENCOUNTER — Encounter: Payer: Self-pay | Admitting: Neurology

## 2022-02-20 VITALS — BP 144/100 | HR 85 | Ht 66.0 in | Wt 188.9 lb

## 2022-02-20 DIAGNOSIS — G35 Multiple sclerosis: Secondary | ICD-10-CM

## 2022-02-20 DIAGNOSIS — G4489 Other headache syndrome: Secondary | ICD-10-CM | POA: Diagnosis not present

## 2022-02-20 DIAGNOSIS — R5383 Other fatigue: Secondary | ICD-10-CM

## 2022-02-20 DIAGNOSIS — R21 Rash and other nonspecific skin eruption: Secondary | ICD-10-CM

## 2022-02-20 DIAGNOSIS — Z79899 Other long term (current) drug therapy: Secondary | ICD-10-CM

## 2022-02-20 MED ORDER — IMIPRAMINE HCL 25 MG PO TABS: 25.0000 mg | ORAL_TABLET | Freq: Every day | ORAL | 11 refills | Status: DC

## 2022-02-20 MED ORDER — AMPHETAMINE-DEXTROAMPHET ER 10 MG PO CP24: 10.0000 mg | ORAL_CAPSULE | Freq: Every day | ORAL | 0 refills | Status: DC

## 2022-02-20 MED ORDER — CHOLESTYRAMINE 4 G PO PACK: PACK | ORAL | 12 refills | Status: DC

## 2022-02-20 NOTE — Progress Notes (Signed)
[facility]  PATIENT: Lindsey Phelps DOB: 03-Jan-1981  REFERRING DOCTOR OR PCP:  Elfredia Nevins SOURCE: Patient, notes from Dr. Sherwood Gambler, laboratory results.  _________________________________   HISTORICAL  CHIEF COMPLAINT:  Chief Complaint  Patient presents with   Follow-up    Pt in room #1 and alone. Pt here today for f/u MS.    HISTORY OF PRESENT ILLNESS:  Lindsey Phelps is a 41 y.o. woman with relapsing remiting MS with multiple tumefactive lesions.  Update 02/20/2022: She continues to have right occipital tenderness,   The pain was helped by occipital nerve blocks/trigger point injections but for just 10 days.       She has a blistering skin condition.   She saw dermatology and  had a biopsy and was told she had atopic dermatitis but that has been questioned.  She is on [name] and has been on since 2020. She has not had any definite exacerbation but has noted some more symptoms..     Gait is mildly off-balanced.  She has residual mild right leg weakness affecting gait at times since the 2019 episode    She has tingling and dysesthesias in the legs, right greater than left.  She has more pain and tingling numbness in the hands.    NCV/EMG 07/19/2019 did not show any neuropathy or evidence of radiculopathy. She has some urinary urgency but no incontinence.   She has word finding difficulties at times and reduced STM/focus.      Adderall has helped some.  She has had issues with cognition.   She is now on disability.   She saw an occupational doctor and psychology for testing.     She was told she needs someone else to handle finances, though she has never had diffiulty with that.  She tries to stay busy and raises a few chickens to stay more active.     She has had some panic attack sensations the last few weeks.      Valium at night helps her sleep.     She has some anxiety and agoraphobia.     .    MS History: In 03/31/2017 she experienced word finding  difficulties and an MRI of the brain was performed concerning for a subacute left frontal stroke. There were a few other punctate T2/FLAIR hyperintense foci consistent with minimal chronic microvascular ischemic change.  Stroke work-up was performed.  I was most concerned about an embolic etiology. The carotid ultrasound was essentially normal. The cardiac event monitor was essentially normal.  A TEE showed a normal ejection fraction of 60-65 percent.   There was no evidence of a patent foramen ovale or other right-to-left shunt. The study was felt to be normal.  She is on aspirin, metoprolol and Crestor.    A subsequent MRI 07/06/2017 showed multiple areas of juxtacortical edema and enhancement, most consistent with demyelination.  A biopsy was considered but due to his improvement on the subsequent MRI, more consistent with demyelination then tumor, this was held.  She was started on Tysabri 07/2017.  MRI 11/10/2017 was stable.  Due to tolerability issues, she stopped May 2020 and started on [name].  Imaging: MRI of the brain 04/07/2017 showed a partially enhancing focus in the left frontal lobe.  There were a couple punctate T2/FLAIR hyperintense foci elsewhere that were not nonspecific.  MRI of the brain 07/06/2017 showed multiple juxtacortical and periventricular enhancing lesions.  MRI of the brain 07/28/2017 showed improvement of several lesions noted on  the previous MRI though one of the foci was larger.  MRI of the brain 11/10/2017 showed no new lesions and resolution of the enhancement seen on the previous MRI  MRI of the brain 03/03/2018 showed no new lesions  MRI of the brain 11/03/2018 showed multiple juxtacortical and periventricular white matter foci.  None of these were new.  They do not enhance.  MRI of the cervical and thoracic spine 11/03/2018 showed a normal spinal cord.  MRI of the brain 05/10/2019 showed no new lesions.  MRI of the brain 05/09/2020 showed multiple T2/FLAIR hyperintense  foci, predominantly in the juxtacortical white matter.  None of the foci enhances or appears to be acute.  Compared to the MRI dated 05/10/2019, there are no new lesions.     REVIEW OF SYSTEMS: Constitutional: No fevers, chills, sweats, or change in appetite.   She has mild sleep onset insomnia Eyes: No visual changes, double vision, eye pain Ear, nose and throat: No hearing loss, ear pain, nasal congestion, sore throat Cardiovascular: No chest pain.  Has intermittent tachycardia.  Respiratory:  No shortness of breath at rest or with exertion.   No wheezes GastrointestinaI: No nausea, vomiting, diarrhea, abdominal pain, fecal incontinence Genitourinary:  No dysuria, urinary retention or frequency.  No nocturia. Musculoskeletal: Neck pain as above.   Integumentary: No rash, pruritus, skin lesions Neurological: as above Psychiatric: No depression at this time.  H/o panic attacks. Endocrine: No palpitations, diaphoresis, change in appetite, change in weigh or increased thirst Hematologic/Lymphatic:  No anemia, purpura, petechiae. Allergic/Immunologic: has some seasonal allergies  ALLERGIES: Allergies  Allergen Reactions   Darvocet [Propoxyphene N-Acetaminophen] Hives   Demerol Hives   Adhesive [Tape] Rash and Other (See Comments)    Including bandaids - takes skin off, peels welts    HOME MEDICATIONS:  Current Outpatient Medications:    Acetaminophen (TYLENOL PO), Take by mouth as needed., Disp: , Rfl:    [name] 14 MG TABS, Take 1 tablet by mouth daily., Disp: 30 tablet, Rfl: 5   cetirizine-pseudoephedrine (ZYRTEC-D) 5-120 MG tablet, Take 1 tablet by mouth 2 (two) times daily., Disp: , Rfl:    Cholecalciferol (VITAMIN D3) 1.25 MG (50000 UT) TABS, Take by mouth., Disp: , Rfl:    cholestyramine (QUESTRAN) 4 g packet, Take 1-2 packs po tid until complete, Disp: 60 each, Rfl: 12   cyclobenzaprine (FLEXERIL) 5 MG tablet, Take up to 3 pills daily as needed, Disp: 90 tablet, Rfl: 5    diazepam (VALIUM) 10 MG tablet, Take 10 mg by mouth at bedtime as needed for anxiety. , Disp: , Rfl:    diphenhydrAMINE (BENADRYL) 25 MG tablet, Take 25 mg by mouth every 6 (six) hours as needed., Disp: , Rfl:    doxycycline (LYMEPAK) 100 MG tablet, Take 100 mg by mouth 2 (two) times daily., Disp: , Rfl:    fluconazole (DIFLUCAN) 150 MG tablet, Take 150 mg by mouth daily., Disp: , Rfl:    halobetasol (ULTRAVATE) 0.05 % cream, Apply 1 application. topically 2 (two) times daily., Disp: , Rfl:    ibuprofen (ADVIL) 200 MG tablet, Take 200 mg by mouth every 6 (six) hours as needed. Up to 3 as needed, Disp: , Rfl:    imipramine (TOFRANIL) 25 MG tablet, Take 1 tablet (25 mg total) by mouth at bedtime., Disp: 30 tablet, Rfl: 11   lactase (LACTAID) 3000 units tablet, Take 9,000 Units by mouth with breakfast, with lunch, and with evening meal., Disp: , Rfl:    levocetirizine (  XYZAL) 5 MG tablet, Take 5 mg by mouth every evening., Disp: , Rfl:    Loperamide HCl (IMODIUM PO), Take 1 Dose by mouth as needed., Disp: , Rfl:    promethazine (PHENERGAN) 25 MG tablet, Take 1 tablet (25 mg total) by mouth 2 (two) times daily as needed for nausea or vomiting., Disp: 60 tablet, Rfl: 5   amphetamine-dextroamphetamine (ADDERALL XR) 10 MG 24 hr capsule, Take 1 capsule (10 mg total) by mouth daily., Disp: 30 capsule, Rfl: 0 No current facility-administered medications for this visit.  Facility-Administered Medications Ordered in Other Visits:    gadopentetate dimeglumine (MAGNEVIST) injection 18 mL, 18 mL, Intravenous, Once PRN, Rockney Grenz A, MD   gadopentetate dimeglumine (MAGNEVIST) injection 18 mL, 18 mL, Intravenous, Once PRN, Jaskirat Schwieger, Pearletha Furl, MD  PAST MEDICAL HISTORY: Past Medical History:  Diagnosis Date   Anxiety    Cold    Heart palpitations    High cholesterol    History of IBS    Hypertension    Multiple sclerosis (HCC)    Stroke [facility])     PAST SURGICAL HISTORY: Past Surgical History:   Procedure Laterality Date   ABDOMINAL HYSTERECTOMY     BLADDER NECK SUSPENSION     CHOLECYSTECTOMY     COLONOSCOPY  01/23/2011   Rehman, normal   LAPAROSCOPIC TUBAL LIGATION     TEE WITHOUT CARDIOVERSION N/A 05/05/2017   Procedure: TRANSESOPHAGEAL ECHOCARDIOGRAM (TEE);  Surgeon: Laqueta Linden, MD;  Location[facility];  Service: Cardiovascular;  Laterality: N/A;    FAMILY HISTORY: Family History  Problem Relation Age of Onset   Diabetes Father    Hypertension Father    Colon polyps Father    Lung cancer Father    Hypertension Mother    Coronary artery disease Mother    COPD Mother     SOCIAL HISTORY:  Social History   Socioeconomic History   Marital status: Married    Spouse name: Not on file   Number of children: Not on file   Years of education: Not on file   Highest education level: Not on file  Occupational History   Occupation: on disability  Tobacco Use   Smoking status: Former    Packs/day: 0.50    Years: 15.00    Total pack years: 7.50    Types: Cigarettes    Quit date: 09/16/2012    Years since quitting: 9.4   Smokeless tobacco: Never  Vaping Use   Vaping Use: Never used  Substance and Sexual Activity   Alcohol use: Yes    Alcohol/week: 1.0 standard drink of alcohol    Types: 1 Shots of liquor per week    Comment: every few months   Drug use: No   Sexual activity: Yes    Birth control/protection: Surgical  Other Topics Concern   Not on file  Social History Narrative   Not on file   Social Determinants of Health   Financial Resource Strain: Not on file  Food Insecurity: Not on file  Transportation Needs: Not on file  Physical Activity: Not on file  Stress: Not on file  Social Connections: Not on file  Intimate Partner Violence: Not on file     PHYSICAL EXAM  Vitals:   02/20/22 1118  BP: (!) 144/100  Pulse: 85  Weight: 188 lb 14.4 oz (85.7 kg)  Height: 5\' 6"  (1.676 m)    Body mass index is 30.49 kg/m.   General:  The patient is well-developed and well-nourished  and in no acute distress.   She has some occipital tenderness on the right.  There is a blistering rash.  Neurologic Exam  Mental status: She is alert and oriented x 3 at the time of the examination. The patient has apparent normal recent and remote memory, with an apparently normal attention span and concentration ability.  She had no noticeable speech issue during today's visit.  Cranial nerves:   Color vision is symmetric.  Color vision is mildly desaturated OD.  Extraocular movements are full.  Facial strength and sensation was normal.. No obvious hearing deficits are noted.  Motor:  Muscle bulk is normal.   Tone is normal.  Strength 5/5.  I Sensory:   She has mildly reduced touch and vibration on right arm and mildly reduced vibration in the right leg leg compared to left  Coordination: Cerebellar testing reveals good finger-nose-finger and left heel-to-shin.  She has reduced right heel-to-shin.  Gait and station: Station is normal.  The gait is mildly wide.  Tandem gait is wide.  Romberg is negative.   Reflexes: Deep tendon reflexes are symmetric and slightly increased in the arms, increased at the knees with crossed adductors, right >left.  She does not have ankle clonus.        DIAGNOSTIC DATA (LABS, IMAGING, TESTING) - I reviewed patient records, labs, notes, testing and imaging myself where available.  Lab Results  Component Value Date   WBC 5.4 09/02/2021   HGB 14.0 09/02/2021   HCT 41.4 09/02/2021   MCV 89 09/02/2021   PLT 233 09/02/2021      Component Value Date/Time   NA 143 09/02/2021 1608   K 3.9 09/02/2021 1608   CL 106 09/02/2021 1608   CO2 21 09/02/2021 1608   GLUCOSE 85 09/02/2021 1608   GLUCOSE 85 03/03/2018 1736   BUN 9 09/02/2021 1608   CREATININE 0.63 09/02/2021 1608   CALCIUM 9.0 09/02/2021 1608   PROT 7.4 10/01/2021 1402   ALBUMIN 4.6 10/01/2021 1402   AST 28 10/01/2021 1402   ALT 27 10/01/2021  1402   ALKPHOS 104 10/01/2021 1402   BILITOT 0.3 10/01/2021 1402   GFRNONAA 93 04/30/2020 1147   GFRAA 107 04/30/2020 1147       ASSESSMENT AND PLAN  1. Multiple sclerosis (HCC)   2. High risk medication use   3. Other headache syndrome   4. Other fatigue   5. Blistering rash        1.   I am unsure what is causing her skin issue.  Ashok Cordia has been associated with some skin disorders though she has been on medication since 2020.  She had mild skin rash in 2020 but is having much more difficulty over the last few months with a blistering rash.  Therefore, I will stop the [name].  We will switch her to an anti-CD20 agent, either Briumvi or Ocrevus.  We will check appropriate blood work.  Additionally, I gave her a prescription for cholestyramine that she will take to illuminate the [name] more rapidly.  She should start a day or 2 before her new MS medication begins.   2.  Continue Adderall XR 10 milligrams.  She had trouble tolerating higher doses. 3.  Imipramine for insomnia and headache 4.   She is on disability.  Although she has impairments that prevent her from working a job that are both physical and cognitive, I believe she is able to handle her personal finances. 5.   Return to see  me in 6 months or sooner if there are new or worsening neurologic symptoms.      Peace Noyes A. Epimenio Foot, MD, PhD, FAAN Certified in Neurology, Clinical Neurophysiology, Sleep Medicine, Pain Medicine and Neuroimaging Director, [facility] at [facility]  [facility] [address], [address] [place], [place] [address] [phone]

## 2022-02-21 ENCOUNTER — Emergency Department (HOSPITAL_COMMUNITY)
Admission: EM | Admit: 2022-02-21 | Discharge: 2022-02-22 | Disposition: A | Discharge status: Home / Self Care | Payer: Managed Care, Other (non HMO) | Attending: Emergency Medicine | Admitting: Emergency Medicine

## 2022-02-21 ENCOUNTER — Other Ambulatory Visit: Payer: Self-pay

## 2022-02-21 ENCOUNTER — Encounter (HOSPITAL_COMMUNITY): Payer: Self-pay

## 2022-02-21 DIAGNOSIS — I10 Essential (primary) hypertension: Secondary | ICD-10-CM | POA: Diagnosis not present

## 2022-02-21 DIAGNOSIS — M79602 Pain in left arm: Secondary | ICD-10-CM | POA: Diagnosis present

## 2022-02-21 DIAGNOSIS — Z87891 Personal history of nicotine dependence: Secondary | ICD-10-CM | POA: Insufficient documentation

## 2022-02-21 DIAGNOSIS — R519 Headache, unspecified: Secondary | ICD-10-CM | POA: Insufficient documentation

## 2022-02-21 LAB — COMPREHENSIVE METABOLIC PANEL
ALT: 24 U/L (ref 0–44)
AST: 29 U/L (ref 15–41)
Albumin: 4.8 g/dL (ref 3.5–5.0)
Alkaline Phosphatase: 88 U/L (ref 38–126)
Anion gap: 11 (ref 5–15)
BUN: 16 mg/dL (ref 6–20)
CO2: 24 mmol/L (ref 22–32)
Calcium: 9.3 mg/dL (ref 8.9–10.3)
Chloride: 104 mmol/L (ref 98–111)
Creatinine, Ser: 0.73 mg/dL (ref 0.44–1.00)
GFR, Estimated: >60 mL/min (ref >60)
Glucose, Bld: 90 mg/dL (ref 70–99)
Potassium: 3.6 mmol/L (ref 3.5–5.1)
Sodium: 139 mmol/L (ref 135–145)
Total Bilirubin: 0.9 mg/dL (ref 0.3–1.2)
Total Protein: 8.2 g/dL — ABNORMAL HIGH (ref 6.5–8.1)

## 2022-02-21 LAB — CBC WITH DIFFERENTIAL/PLATELET
Abs Immature Granulocytes: 0.02 10*3/uL (ref 0.00–0.07)
Basophils Absolute: 0.1 10*3/uL (ref 0.0–0.1)
Basophils Relative: 1 %
Eosinophils Absolute: 0.9 10*3/uL — ABNORMAL HIGH (ref 0.0–0.5)
Eosinophils Relative: 11 %
HCT: 42.8 % (ref 36.0–46.0)
Hemoglobin: 14.1 g/dL (ref 12.0–15.0)
Immature Granulocytes: 0 %
Lymphocytes Relative: 42 %
Lymphs Abs: 3.3 10*3/uL (ref 0.7–4.0)
MCH: 29.4 pg (ref 26.0–34.0)
MCHC: 32.9 g/dL (ref 30.0–36.0)
MCV: 89.4 fL (ref 80.0–100.0)
Monocytes Absolute: 0.6 10*3/uL (ref 0.1–1.0)
Monocytes Relative: 7 %
Neutro Abs: 3.1 10*3/uL (ref 1.7–7.7)
Neutrophils Relative %: 39 %
Platelets: 263 10*3/uL (ref 150–400)
RBC: 4.79 MIL/uL (ref 3.87–5.11)
RDW: 12.6 % (ref 11.5–15.5)
WBC: 7.9 10*3/uL (ref 4.0–10.5)
nRBC: 0 % (ref 0.0–0.2)

## 2022-02-21 LAB — TROPONIN I (HIGH SENSITIVITY): Troponin I (High Sensitivity): <2 ng/L (ref <18)

## 2022-02-21 MED ORDER — SODIUM CHLORIDE 0.9 % IV BOLUS
1000.0000 mL | Freq: Once | INTRAVENOUS | Status: AC
  Administered 2022-02-22: 1000 mL via INTRAVENOUS

## 2022-02-21 MED ORDER — DIPHENHYDRAMINE HCL 50 MG/ML IJ SOLN
25.0000 mg | Freq: Once | INTRAMUSCULAR | Status: AC
  Administered 2022-02-22: 25 mg via INTRAVENOUS
  Filled 2022-02-21: qty 1

## 2022-02-21 MED ORDER — PROCHLORPERAZINE EDISYLATE 10 MG/2ML IJ SOLN
10.0000 mg | Freq: Once | INTRAMUSCULAR | Status: AC
  Administered 2022-02-22: 10 mg via INTRAVENOUS
  Filled 2022-02-21: qty 2

## 2022-02-21 NOTE — ED Triage Notes (Addendum)
Pt reports sharp pains starting at left shoulder shooting down to wrist, pt reports "heart pounding with this shooting pain"- left neck pain reported as well, pt took baby ASA around 1600 and nitro around 1830, says shooting pains subsided, but pain is still present described as "aching" in left shoulder. Denies injury. Pt started taking cholestyramine today, took first packet this morning, prescribed by neuro to "bind and remove the [name] out of system, pt was taking this for MS. She was told by Dr "not to look up side effects".

## 2022-02-22 ENCOUNTER — Emergency Department (HOSPITAL_COMMUNITY): Payer: Managed Care, Other (non HMO)

## 2022-02-22 LAB — TROPONIN I (HIGH SENSITIVITY): Troponin I (High Sensitivity): <2 ng/L (ref <18)

## 2022-02-22 NOTE — Discharge Instructions (Signed)
You were evaluated in the Emergency Department and after careful evaluation, we did not find any emergent condition requiring admission or further testing in the hospital.  Your exam/testing today is overall reassuring.  No signs of heart damage in the blood work.  Recommend follow-up with cardiology to discuss your symptoms.  Please return to the Emergency Department if you experience any worsening of your condition.   Thank you for allowing [place] to be a part of your care.

## 2022-02-22 NOTE — ED Provider Notes (Signed)
[facility] Emergency Department Provider Note MRN:  [id number]  Arrival date & time: 02/22/22     Chief Complaint   Arm Pain (Left arm, neck pain)   History of Present Illness   Lindsey Phelps is a 41 y.o. year-old female with a history of MS hypertension presenting to the ED with chief complaint of arm pain.  Sudden shooting pain down the left arm today at about 3 PM.  Happening intermittently since that time.  Also having some shooting similar type of pain to the left jaw.  Denies chest pain, no shortness of breath, no dizziness or diaphoresis, no nausea vomiting.  Has a history of MS.  Has been having headaches for several months, not uncommon for her.  Denies numbness or weakness to the arms or legs.  Review of Systems  A thorough review of systems was obtained and all systems are negative except as noted in the HPI and PMH.   Patient's Health History    Past Medical History:  Diagnosis Date   Anxiety    Cold    Heart palpitations    High cholesterol    History of IBS    Hypertension    Multiple sclerosis (HCC)    Stroke [facility])     Past Surgical History:  Procedure Laterality Date   ABDOMINAL HYSTERECTOMY     BLADDER NECK SUSPENSION     CHOLECYSTECTOMY     COLONOSCOPY  01/23/2011   Rehman, normal   LAPAROSCOPIC TUBAL LIGATION     TEE WITHOUT CARDIOVERSION N/A 05/05/2017   Procedure: TRANSESOPHAGEAL ECHOCARDIOGRAM (TEE);  Surgeon: Laqueta Linden, MD;  Location: [facility];  Service: Cardiovascular;  Laterality: N/A;    Family History  Problem Relation Age of Onset   Diabetes Father    Hypertension Father    Colon polyps Father    Lung cancer Father    Hypertension Mother    Coronary artery disease Mother    COPD Mother     Social History   Socioeconomic History   Marital status: Married    Spouse name: Not on file   Number of children: Not on file   Years of education: Not on file   Highest education level: Not on file   Occupational History   Occupation: on disability  Tobacco Use   Smoking status: Former    Packs/day: 0.50    Years: 15.00    Total pack years: 7.50    Types: Cigarettes    Quit date: 09/16/2012    Years since quitting: 9.4   Smokeless tobacco: Never  Vaping Use   Vaping Use: Never used  Substance and Sexual Activity   Alcohol use: Yes    Alcohol/week: 1.0 standard drink of alcohol    Types: 1 Shots of liquor per week    Comment: every few months   Drug use: No   Sexual activity: Yes    Birth control/protection: Surgical  Other Topics Concern   Not on file  Social History Narrative   Not on file   Social Determinants of Health   Financial Resource Strain: Not on file  Food Insecurity: Not on file  Transportation Needs: Not on file  Physical Activity: Not on file  Stress: Not on file  Social Connections: Not on file  Intimate Partner Violence: Not on file     Physical Exam   Vitals:   02/21/22 2100  BP: (!) 125/95  Pulse: 79  Temp: 98.1 F (36.7 C)  SpO2: 100%    CONSTITUTIONAL: Well-appearing, NAD NEURO/PSYCH:  Alert and oriented x 3, no focal deficits EYES:  eyes equal and reactive ENT/NECK:  no LAD, no JVD CARDIO: Regular rate, well-perfused, normal S1 and S2 PULM:  CTAB no wheezing or rhonchi GI/GU:  non-distended, non-tender MSK/SPINE:  No gross deformities, no edema SKIN:  no rash, atraumatic   *Additional and/or pertinent findings included in MDM below  Diagnostic and Interventional Summary    EKG Interpretation  Date/Time:  Friday February 21 2022 20:50:46 EDT Ventricular Rate:  87 PR Interval:  98 QRS Duration: 82 QT Interval:  332 QTC Calculation: 399 R Axis:   53 Text Interpretation: Sinus rhythm with sinus arrhythmia with short PR Otherwise normal ECG When compared with ECG of 06-Jul-2017 08:14, PREVIOUS ECG IS PRESENT Confirmed by Kennis Carina [id number]) on 02/21/2022 11:00:25 PM       Labs Reviewed  CBC WITH DIFFERENTIAL/PLATELET  - Abnormal; Notable for the following components:      Result Value   Eosinophils Absolute 0.9 (*)    All other components within normal limits  COMPREHENSIVE METABOLIC PANEL - Abnormal; Notable for the following components:   Total Protein 8.2 (*)    All other components within normal limits  TROPONIN I (HIGH SENSITIVITY)  TROPONIN I (HIGH SENSITIVITY)    DG Chest Port 1 View  Final Result      Medications  diphenhydrAMINE (BENADRYL) injection 25 mg (25 mg Intravenous Given 02/22/22 0009)  prochlorperazine (COMPAZINE) injection 10 mg (10 mg Intravenous Given 02/22/22 0011)  sodium chloride 0.9 % bolus 1,000 mL (1,000 mLs Intravenous New Bag/Given 02/22/22 0013)     Procedures  /  Critical Care Procedures  ED Course and Medical Decision Making  Initial Impression and Ddx Symptoms possibly referred cardiac pain ED presentation is fairly atypical.  History of hypertension, has a brother who had a heart attack at the age of 74.  Former smoker but not for the past 9 years.  Other considerations include MSK, radicular cervical pain.  Spurling sign is negative, does not have any pain with range of motion of the neck or palpation.    Past medical/surgical history that increases complexity of ED encounter: MS  Interpretation of Diagnostics I personally reviewed the EKG and my interpretation is as follows: Sinus rhythm without obvious ischemic features  Labs reassuring with no significant blood count or electrolyte disturbance.  Troponin negative x2.  Patient Reassessment and Ultimate Disposition/Management     Patient's heart score is 1, appropriate for discharge with referral to cardiology.  Patient management required discussion with the following services or consulting groups:  None  Complexity of Problems Addressed Acute illness or injury that poses threat of life of bodily function  Additional Data Reviewed and Analyzed Further history obtained from: None  Additional Factors  Impacting ED Encounter Risk None  Elmer Sow. Pilar Plate, MD [facility] Emergency Medicine [facility] [email or web address]  Final Clinical Impressions(s) / ED Diagnoses     ICD-10-CM   1. Left arm pain  M79.602 Ambulatory referral to Cardiology      ED Discharge Orders          Ordered    Ambulatory referral to Cardiology        02/22/22 0200             Discharge Instructions Discussed with and Provided to Patient:     Discharge Instructions      You were evaluated in the Emergency Department and  after careful evaluation, we did not find any emergent condition requiring admission or further testing in the hospital.  Your exam/testing today is overall reassuring.  No signs of heart damage in the blood work.  Recommend follow-up with cardiology to discuss your symptoms.  Please return to the Emergency Department if you experience any worsening of your condition.   Thank you for allowing [place] to be a part of your care.       Maudie Flakes, MD 02/22/22 [phone]

## 2022-02-26 LAB — QUANTIFERON-TB GOLD PLUS
QuantiFERON Mitogen Value: >10 IU/mL
QuantiFERON Nil Value: 0.08 IU/mL
QuantiFERON TB1 Ag Value: 0.14 IU/mL
QuantiFERON TB2 Ag Value: 0.12 IU/mL
QuantiFERON-TB Gold Plus: NEGATIVE

## 2022-02-26 LAB — HIV ANTIBODY (ROUTINE TESTING W REFLEX): HIV Screen 4th Generation wRfx: NONREACTIVE

## 2022-02-26 LAB — HEPATITIS B SURFACE ANTIGEN: Hepatitis B Surface Ag: NEGATIVE

## 2022-02-26 LAB — HEPATITIS B SURFACE ANTIBODY,QUALITATIVE: Hep B Surface Ab, Qual: NONREACTIVE

## 2022-02-26 LAB — IGG, IGA, IGM
IgA/Immunoglobulin A, Serum: 120 mg/dL (ref 87–352)
IgG (Immunoglobin G), Serum: 820 mg/dL (ref 586–1602)
IgM (Immunoglobulin M), Srm: 74 mg/dL (ref 26–217)

## 2022-02-26 LAB — HEPATITIS B CORE ANTIBODY, TOTAL: Hep B Core Total Ab: NEGATIVE

## 2022-02-26 LAB — VARICELLA ZOSTER ANTIBODY, IGG: Varicella zoster IgG: >4000 index (ref >165)

## 2022-02-26 LAB — HEPATITIS C ANTIBODY: Hep C Virus Ab: NONREACTIVE

## 2022-03-04 ENCOUNTER — Telehealth: Payer: Self-pay

## 2022-03-04 NOTE — Telephone Encounter (Signed)
-----   Message from Britt Bottom, MD sent at 03/04/2022  4:37 PM EDT ----- She is fine for either Briumvi or Ocrevus

## 2022-03-05 ENCOUNTER — Ambulatory Visit: Payer: Managed Care, Other (non HMO) | Admitting: Allergy & Immunology

## 2022-03-11 NOTE — Telephone Encounter (Addendum)
Ocrevus start form faxed to [facility]. Received a receipt of confirmation.  Ocrevus order given to infusion suite to begin processing.

## 2022-03-25 MED ORDER — CHOLESTYRAMINE 4 G PO PACK: PACK | ORAL | 0 refills | Status: DC

## 2022-03-25 NOTE — Telephone Encounter (Signed)
Called pt back. She has not received a date yet for her first Ocrevus infusion w/ [facility].  Working on [employer]er still. I sent message to [facility] to get update on getting pt scheduled. Waiting on response.  She started Questran yesterday. SE started after taking med. Was not able to sleep d/t heartburn. She wants to know what Dr. Felecia Shelling recommends. If there is something she can take with it to help w/ SE or if she should d/c? Also wanting to confirm if she should be starting now or waiting. Aware I will speak with Dr. Felecia Shelling and call her back.

## 2022-03-25 NOTE — Telephone Encounter (Signed)
Dr. Felecia Shelling, just to clarify the dosing, do you want her to take 1/2 packet three times daily for 10 days? Starting 1-2 days before whenever she gets scheduled for Ocrevus infusion?

## 2022-03-25 NOTE — Telephone Encounter (Signed)
Pt called needing to speak to the RN regarding her cholestyramine (QUESTRAN) 4 g packet Pt is having really bad heartburn and other symptoms that she would like to discuss. Please advise.

## 2022-03-25 NOTE — Telephone Encounter (Signed)
Spoke w/ Maudie Mercury. They are still waiting to get final approval from insurance. Once they get this, they will call her to schedule.   Per Dr. Felecia Shelling,  he would like her to take Questran 4g, 1/2 pack three times daily for 10 days starting the day after her first Ocrevus infusion and then stop.   I called pt and relayed above info. She wrote down instructions. She verbalized understanding and appreciation.  I called pharmacy and cx any refills on file for Questran per MD request. Spoke w/ Cyril Mourning.

## 2022-03-26 NOTE — Telephone Encounter (Signed)
Ocrevus denied. Appeal letter faxed to [facility] at [phone]. Received fax confirmation. Waiting on determination

## 2022-03-28 ENCOUNTER — Ambulatory Visit: Payer: Managed Care, Other (non HMO) | Admitting: Allergy & Immunology

## 2022-04-01 NOTE — Telephone Encounter (Signed)
Called [employer] at [phone] (chose authorization option, then pharmacy). Spoke w/ Anderson Malta. Confirmed they received appeal 03/27/22 but declined it being reviewed urgently. States it did not meet requirement to be reviewed urgently. They state they LVM 03/28/22 at 11:08am letting us know however we did not receive this message.   I resubmitted appeal advising why request meets an expedited review. Request ID# [id number]. Received fax confirmation. Waiting on determination.

## 2022-04-07 NOTE — Telephone Encounter (Signed)
I called [employer]. I spoke with Heather. This was a 64 minute phone call. The expedited appeal for Ocrevus is still open. Without an expedited review, the appeal can take 30 days for a determination. She found the documentation submitted on 04/01/22 asking for an expedited appeal and explanation on why an expedited appeal is needed. She has escalated this appeal. We should have a determination within 4 business days.

## 2022-04-21 NOTE — Telephone Encounter (Signed)
I called [employer]. I spoke with Consuella Lose. This was a 34 minute call. The appeal was approved. They are faxing a determination letter right now.

## 2022-04-21 NOTE — Telephone Encounter (Signed)
Ocrevus was approved from 04/11/22-04/12/2023. SR # [id number].  Gave this fax to infusion suite for scheduling.

## 2022-04-22 ENCOUNTER — Other Ambulatory Visit: Payer: Self-pay | Admitting: Neurology

## 2022-05-02 ENCOUNTER — Encounter: Payer: Self-pay | Admitting: Allergy & Immunology

## 2022-05-02 ENCOUNTER — Ambulatory Visit: Payer: Managed Care, Other (non HMO) | Admitting: Allergy & Immunology

## 2022-05-02 VITALS — BP 130/88 | HR 103 | Temp 97.9°F | Resp 18 | Ht 65.75 in | Wt 201.4 lb

## 2022-05-02 DIAGNOSIS — L2089 Other atopic dermatitis: Secondary | ICD-10-CM | POA: Diagnosis not present

## 2022-05-02 DIAGNOSIS — L235 Allergic contact dermatitis due to other chemical products: Secondary | ICD-10-CM | POA: Diagnosis not present

## 2022-05-02 DIAGNOSIS — J310 Chronic rhinitis: Secondary | ICD-10-CM

## 2022-05-02 DIAGNOSIS — G35 Multiple sclerosis: Secondary | ICD-10-CM | POA: Diagnosis not present

## 2022-05-02 NOTE — Progress Notes (Unsigned)
NEW PATIENT  Date of Service/Encounter:  05/02/22  Consult requested by: No primary care provider on file.   Assessment:   Flexural atopic dermatitis - Plan: Alpha-Gal Panel, ANA, IFA (with reflex), Chronic Urticaria, C-reactive protein, CMP14+EGFR, Sedimentation rate, Tryptase, Thyroid antibodies, CBC With Differential, Protein electrophoresis, serum, Allergens w/Comp Rflx Area 2  Allergic dermatitis due to other chemical product  Plan/Recommendations:    Patient Instructions  1. Flexural atopic dermatitis - I agree with Dr. Fontaine No that Dupixent is the next best step. - I am going to get some labs to look for weird causes of rashes, although I suspect that they are going to be normal. - We need to start you on a class of non-steroidal ointments in order to fulfill a step needed to get the [place] approved. - We are also giving you some samples of Epiceram to try to help with the itchiness.  - I will send all of my notes to Dr. Fontaine No.   2. Return in about 4 weeks (around 05/30/2022).    Please inform us of any Emergency Department visits, hospitalizations, or changes in symptoms. Call us before going to the ED for breathing or allergy symptoms since we might be able to fit you in for a sick visit. Feel free to contact us anytime with any questions, problems, or concerns.  It was a pleasure to meet you today! I am sorry that you are going through all of this.   Websites that have reliable patient information: 1. American Academy of Asthma, Allergy, and Immunology: www.aaaai.org 2. Food Allergy Research and Education (FARE): foodallergy.org 3. Mothers of Asthmatics: http://www.asthmacommunitynetwork.org 4. American College of Allergy, Asthma, and Immunology: www.acaai.org   COVID-19 Vaccine Information can be found at: [email or web address] For questions related to vaccine distribution or appointments, please  email [email or web address] or call [phone].   We realize that you might be concerned about having an allergic reaction to the COVID19 vaccines. To help with that concern, WE ARE OFFERING THE COVID19 VACCINES IN OUR OFFICE! Ask the front desk for dates!     "Like" [place] on Facebook and Instagram for our latest updates!      A healthy democracy works best when [employer] participate! Make sure you are registered to vote! If you have moved or changed any of your contact information, you will need to get this updated before voting!  In some cases, you MAY be able to register to vote online: [email or web address]             {Blank single:19197::"This note in its entirety was forwarded to the Provider who requested this consultation."}  Subjective:   Lindsey Phelps is a 41 y.o. female presenting today for evaluation of  Chief Complaint  Patient presents with   Other    Started in 2020 started abajio for MS. Started with a rash on her skin. Her rash turned kind of like eczema. This past year has been getting worse. Has been to 3 dermatologist this year. Had a patch test done earlier this year. Finished her predisone course and the rash came back all over. Has done a lot of changes in her products.    Allergic Reaction    Has been having multiple medication allergies. Had a skin biopsy and showed atopic dermatitis.    Allergy Testing    Testing for animals     REGHAN THUL has a history of the following: Patient Active Problem List   Diagnosis Date Noted   Blistering rash  02/20/2022   Nausea without vomiting 04/09/2021   Irritable bowel syndrome with diarrhea 04/09/2021   Nausea 10/31/2020   High risk medication use 10/24/2019   Cognitive deficit secondary to MS [facility]) 12/14/2018   Other fatigue 12/14/2018   Other headache syndrome 10/22/2018   Neck pain 03/08/2018   Urinary dysfunction 11/20/2017   Multiple sclerosis ([place]) 07/06/2017    Speech disturbance 06/11/2017   Stroke ([place]) 04/07/2017   Numbness 03/31/2017   Vertigo 03/31/2017   Slurred speech 03/31/2017   Vision disturbance 03/31/2017   Anxiety 03/31/2017   Gait disturbance 03/31/2017   Leg swelling 12/30/2013   Palpitations 09/16/2013   Chest pain 09/16/2013    History obtained from: chart review and {Persons; PED relatives w/patient:19415::"patient"}.  KORTNIE STOVALL was referred by No primary care provider on file.Lindsey Phelps is a 41 y.o. female presenting for {Blank single:19197::"a food challenge","a drug challenge","skin testing","a sick visit","an evaluation of ***","a follow up visit"}.  She first started having issues in 2020 when she started her newest MS medication. It started gradually in the summer 2020 the first month thaet she started [place]. She also had a new tattoo that same month. She started having the rash on her legs, sides, back, and stomach. It has gotten worse this last year especially since October 2022. It has been unbearable for one year. She had patch testing   Currently she is seeing Dr. Lynnell Dike at [facility]. She just finished high dose prednisone that cleared it up. But then it started coming right back. She stopped the [name] two months ago, but it can stay in the system for two years per the patient. Anyway she was on a prednisone taper and the rash started to come back when she got back to 2 prednisone per day for four days. Then it started coming back from the feet up. Dr. Tarri Glenn apparently wanted this to be mites from hay. She was started on Atarax and she developed blistering skin. Prednisone resolved that.   These rashes have included intense blistering with clear fluid. She shows me several pictures.   She has tried halobetasol cream with minimal success. She was also placed on triamcinolone 0.1% ointment. She has tried hydrocortisone 0.1% cream and etaphil cream. She has not tried any Elidel, Protopic, or  Eucrisa. She has reacted to the hydroxyzine, but does does use   She had two biopsies on her hands done by Dr. Tarri Glenn in July 2023. Then she had another one from a lesion on her foot by Dr. Delon [place]. These all came back as atopic dermatitis.   She changed to All Free and Clear and unscented everything. Recently she stopped using the Dove products and switched to Aveeno and Vanicream shampoo. There has been some releif in certain places. th She was just approved for Ocrevus for her MS. She has not started it yet.   She had patch testing done with Dr. Nevada Crane here in [place]. She had to do patch testing on her stomach. It sounds like this is a True TEst. She was llergic to balsalm of [place] and nickel which she knew about already  Stroke preseted in 2018 and then MS confirmed in 2019.    {Blank single:19197::"Asthma/Respiratory Symptom History: ***"," "}  {Blank single:19197::"Allergic Rhinitis Symptom History: ***"," "}  {Blank single:19197::"Food Allergy Symptom History: ***"," "}  {Blank single:19197::"Skin Symptom History: ***"," "}  {Blank single:19197::"GERD Symptom History: ***"," "}  ***Otherwise, there is no history of other atopic diseases, including {  Blank multiple:19196:o:"asthma","food allergies","drug allergies","environmental allergies","stinging insect allergies","eczema","urticaria","contact dermatitis"}. There is no significant infectious history. ***Vaccinations are up to date.    Past Medical History: Patient Active Problem List   Diagnosis Date Noted   Blistering rash 02/20/2022   Nausea without vomiting 04/09/2021   Irritable bowel syndrome with diarrhea 04/09/2021   Nausea 10/31/2020   High risk medication use 10/24/2019   Cognitive deficit secondary to MS [facility]) 12/14/2018   Other fatigue 12/14/2018   Other headache syndrome 10/22/2018   Neck pain 03/08/2018   Urinary dysfunction 11/20/2017   Multiple sclerosis ([place]) 07/06/2017   Speech disturbance  06/11/2017   Stroke ([place]) 04/07/2017   Numbness 03/31/2017   Vertigo 03/31/2017   Slurred speech 03/31/2017   Vision disturbance 03/31/2017   Anxiety 03/31/2017   Gait disturbance 03/31/2017   Leg swelling 12/30/2013   Palpitations 09/16/2013   Chest pain 09/16/2013    Medication List:  Allergies as of 05/02/2022       Reactions   Darvocet [propoxyphene N-acetaminophen] Hives   Demerol Hives   Adhesive [tape] Rash, Other (See Comments)   Including bandaids - takes skin off, peels welts        Medication List        Accurate as of May 02, 2022  3:33 PM. If you have any questions, ask your nurse or doctor.          amoxicillin 875 MG tablet Commonly known as: AMOXIL SMARTSIG:1 Tablet(s) By Mouth Every 12 Hours   amphetamine-dextroamphetamine 10 MG 24 hr capsule Commonly known as: ADDERALL XR TAKE ONE CAPSULE BY MOUTH DAILY   [name] 14 MG Tabs Generic drug: Teriflunomide Take 1 tablet by mouth daily.   cetirizine-pseudoephedrine 5-120 MG tablet Commonly known as: ZYRTEC-D Take 1 tablet by mouth 2 (two) times daily.   cholestyramine 4 g packet Commonly known as: Questran Take 1-2 packs po tid until complete   cyclobenzaprine 5 MG tablet Commonly known as: FLEXERIL Take up to 3 pills daily as needed   diazepam 10 MG tablet Commonly known as: VALIUM Take 10 mg by mouth at bedtime as needed for anxiety.   diphenhydrAMINE 25 MG tablet Commonly known as: BENADRYL Take 25 mg by mouth every 6 (six) hours as needed.   doxepin 10 MG capsule Commonly known as: SINEQUAN TAKE THREE (3) CAPSULES BY MOUTH ONCE DAILY. START WITH ONE CAPSULE AND WORK UP TO THREE TIMES DAILY AS DIRECTED   erythromycin ophthalmic ointment 4 (four) times daily.   fluconazole 150 MG tablet Commonly known as: DIFLUCAN Take 150 mg by mouth daily.   halobetasol 0.05 % cream Commonly known as: ULTRAVATE Apply 1 application. topically 2 (two) times daily.   ibuprofen 200 MG  tablet Commonly known as: ADVIL Take 200 mg by mouth every 6 (six) hours as needed. Up to 3 as needed   imipramine 25 MG tablet Commonly known as: Tofranil Take 1 tablet (25 mg total) by mouth at bedtime.   IMODIUM PO Take 1 Dose by mouth as needed.   Lactaid 3000 units tablet Generic drug: lactase Take 9,000 Units by mouth with breakfast, with lunch, and with evening meal.   levocetirizine 5 MG tablet Commonly known as: XYZAL Take 5 mg by mouth every evening.   Lymepak 100 MG tablet Generic drug: doxycycline Take 100 mg by mouth 2 (two) times daily.   promethazine 25 MG tablet Commonly known as: PHENERGAN Take 1 tablet (25 mg total) by mouth 2 (two) times daily as needed for  nausea or vomiting.   triamcinolone cream 0.1 % Commonly known as: KENALOG SMARTSIG:Sparingly Topical Twice Daily   TYLENOL PO Take by mouth as needed.   Vitamin D3 1.25 MG (50000 UT) Tabs Take by mouth.        Birth History: {Blank single:19197::"non-contributory","born premature and spent time in the NICU","born at term without complications"}  Developmental History: Genia has met all milestones on time. She has required no {Blank multiple:19196:a:"speech therapy","occupational therapy","physical therapy"}. ***non-contributory  Past Surgical History: Past Surgical History:  Procedure Laterality Date   ABDOMINAL HYSTERECTOMY     BLADDER NECK SUSPENSION     CHOLECYSTECTOMY     COLONOSCOPY  01/23/2011   Rehman, normal   LAPAROSCOPIC TUBAL LIGATION     TEE WITHOUT CARDIOVERSION N/A 05/05/2017   Procedure: TRANSESOPHAGEAL ECHOCARDIOGRAM (TEE);  Surgeon: Herminio Commons, MD;  Location: [facility];  Service: Cardiovascular;  Laterality: N/A;     Family History: Family History  Problem Relation Age of Onset   Asthma Mother    Hypertension Mother    Coronary artery disease Mother    COPD Mother    Diabetes Father    Hypertension Father    Colon polyps Father    Lung cancer  Father    Allergic rhinitis Brother      Social History: Mitzi lives at home with ***. They live in a house that is 42 years old. There is luxury vinyl plank throughout the home with carpeting. There are two english bulldogs and chickens, horses, mules and donkeys in the home. There are no dust mite coverings on the bedding. There is no tobacco exposure. She is currently on disability since 2020.    ROS     Objective:   Blood pressure 130/88, pulse (!) 103, temperature 97.9 F (36.6 C), resp. rate 18, height 5' 5.75" (1.67 m), weight 201 lb 6 oz (91.3 kg), SpO2 97 %. Body mass index is 32.75 kg/m.     Physical Exam   Diagnostic studies: {Blank single:19197::"none","deferred due to recent antihistamine use","labs sent instead"," "}  Spirometry: {Blank single:19197::"results normal (FEV1: ***%, FVC: ***%, FEV1/FVC: ***%)","results abnormal (FEV1: ***%, FVC: ***%, FEV1/FVC: ***%)"}.    {Blank single:19197::"Spirometry consistent with mild obstructive disease","Spirometry consistent with moderate obstructive disease","Spirometry consistent with severe obstructive disease","Spirometry consistent with possible restrictive disease","Spirometry consistent with mixed obstructive and restrictive disease","Spirometry uninterpretable due to technique","Spirometry consistent with normal pattern"}. {Blank single:19197::"Albuterol/Atrovent nebulizer","Xopenex/Atrovent nebulizer","Albuterol nebulizer","Albuterol four puffs via MDI","Xopenex four puffs via MDI"} treatment given in clinic with {Blank single:19197::"significant improvement in FEV1 per ATS criteria","significant improvement in FVC per ATS criteria","significant improvement in FEV1 and FVC per ATS criteria","improvement in FEV1, but not significant per ATS criteria","improvement in FVC, but not significant per ATS criteria","improvement in FEV1 and FVC, but not significant per ATS criteria","no improvement"}.  Allergy Studies: {Blank  single:19197::"none","labs sent instead"," "}    {Blank single:19197::"Allergy testing results were read and interpreted by myself, documented by clinical staff."," "}         Salvatore Marvel, MD Allergy and [facility]

## 2022-05-02 NOTE — Patient Instructions (Addendum)
1. Flexural atopic dermatitis - I agree with Dr. Leonie Man that Dupixent is the next best step. - I am going to get some labs to look for weird causes of rashes, although I suspect that they are going to be normal. - We need to start you on a class of non-steroidal ointments in order to fulfill a step needed to get the Dupixent approved. - We are also giving you some samples of Epiceram to try to help with the itchiness.  - I will send all of my notes to Dr. Leonie Man.   2. Return in about 4 weeks (around 05/30/2022).    Please inform us of any Emergency Department visits, hospitalizations, or changes in symptoms. Call us before going to the ED for breathing or allergy symptoms since we might be able to fit you in for a sick visit. Feel free to contact us anytime with any questions, problems, or concerns.  It was a pleasure to meet you today! I am sorry that you are going through all of this.   Websites that have reliable patient information: 1. American Academy of Asthma, Allergy, and Immunology: www.aaaai.org 2. Food Allergy Research and Education (FARE): foodallergy.org 3. Mothers of Asthmatics: http://www.asthmacommunitynetwork.org 4. American College of Allergy, Asthma, and Immunology: www.acaai.org   COVID-19 Vaccine Information can be found at: [email or web address].[email or web address] For questions related to vaccine distribution or appointments, please email [email or web address] or call [phone].   We realize that you might be concerned about having an allergic reaction to the COVID19 vaccines. To help with that concern, WE ARE OFFERING THE COVID19 VACCINES IN OUR OFFICE! Ask the front desk for dates!     "Like" [place] on Facebook and Instagram for our latest updates!      A healthy democracy works best when [employer] participate! Make sure you are registered to vote! If you have moved or changed any of your contact information, you  will need to get this updated before voting!  In some cases, you MAY be able to register to vote online: [email or web address]

## 2022-05-06 LAB — ALPHA-GAL PANEL
Allergen Lamb IgE: <0.1 kU/L
Beef IgE: <0.1 kU/L
IgE (Immunoglobulin E), Serum: 24 IU/mL (ref 6–495)
O215-IgE Alpha-Gal: <0.1 kU/L
Pork IgE: <0.1 kU/L

## 2022-05-07 ENCOUNTER — Telehealth: Payer: Self-pay | Admitting: *Deleted

## 2022-05-07 MED ORDER — PIMECROLIMUS 1 % EX CREA: TOPICAL_CREAM | Freq: Two times a day (BID) | CUTANEOUS | 1 refills | Status: DC

## 2022-05-07 NOTE — Telephone Encounter (Signed)
Called patient and advised step therapy will need to try elidel or protopic to get Ins approval for Dupixent and sent in elidel. Patient has appt in couple weeks with Dr Dellis Anes for followup

## 2022-05-07 NOTE — Telephone Encounter (Signed)
-----   Message from Alfonse Spruce, MD sent at 05/06/2022  7:56 AM EST ----- Dupixent new start (did not give sample).

## 2022-05-08 NOTE — Telephone Encounter (Signed)
Thanks Lindsey Phelps 

## 2022-05-09 LAB — CMP14+EGFR
ALT: 16 IU/L (ref 0–32)
AST: 18 IU/L (ref 0–40)
Albumin/Globulin Ratio: 1.8 (ref 1.2–2.2)
Albumin: 4.3 g/dL (ref 3.9–4.9)
Alkaline Phosphatase: 77 IU/L (ref 44–121)
BUN/Creatinine Ratio: 18 (ref 9–23)
BUN: 14 mg/dL (ref 6–24)
Bilirubin Total: 0.3 mg/dL (ref 0.0–1.2)
CO2: 23 mmol/L (ref 20–29)
Calcium: 9.3 mg/dL (ref 8.7–10.2)
Chloride: 105 mmol/L (ref 96–106)
Creatinine, Ser: 0.8 mg/dL (ref 0.57–1.00)
Globulin, Total: 2.4 g/dL (ref 1.5–4.5)
Glucose: 84 mg/dL (ref 70–99)
Potassium: 4.2 mmol/L (ref 3.5–5.2)
Sodium: 143 mmol/L (ref 134–144)
Total Protein: 6.7 g/dL (ref 6.0–8.5)
eGFR: 94 mL/min/{1.73_m2} (ref >59)

## 2022-05-09 LAB — CBC WITH DIFFERENTIAL
Basophils Absolute: 0.1 10*3/uL (ref 0.0–0.2)
Basos: 1 %
EOS (ABSOLUTE): 0.4 10*3/uL (ref 0.0–0.4)
Eos: 9 %
Hematocrit: 38.7 % (ref 34.0–46.6)
Hemoglobin: 12.6 g/dL (ref 11.1–15.9)
Immature Grans (Abs): 0 10*3/uL (ref 0.0–0.1)
Immature Granulocytes: 0 %
Lymphocytes Absolute: 2 10*3/uL (ref 0.7–3.1)
Lymphs: 43 %
MCH: 29.2 pg (ref 26.6–33.0)
MCHC: 32.6 g/dL (ref 31.5–35.7)
MCV: 90 fL (ref 79–97)
Monocytes Absolute: 0.4 10*3/uL (ref 0.1–0.9)
Monocytes: 8 %
Neutrophils Absolute: 1.9 10*3/uL (ref 1.4–7.0)
Neutrophils: 39 %
RBC: 4.31 x10E6/uL (ref 3.77–5.28)
RDW: 12.9 % (ref 11.7–15.4)
WBC: 4.8 10*3/uL (ref 3.4–10.8)

## 2022-05-09 LAB — ALLERGENS W/COMP RFLX AREA 2
Alternaria Alternata IgE: 0.1 kU/L — AB
Aspergillus Fumigatus IgE: <0.1 kU/L
Bermuda Grass IgE: 0.14 kU/L — AB
Cedar, Mountain IgE: <0.1 kU/L
Cladosporium Herbarum IgE: <0.1 kU/L
Cockroach, German IgE: <0.1 kU/L
Common Silver Birch IgE: 0.45 kU/L — AB
Cottonwood IgE: <0.1 kU/L
D Farinae IgE: <0.1 kU/L
D Pteronyssinus IgE: <0.1 kU/L
E001-IgE Cat Dander: 0.11 kU/L — AB
E005-IgE Dog Dander: 0.13 kU/L — AB
Elm, American IgE: 0.12 kU/L — AB
IgE (Immunoglobulin E), Serum: 24 IU/mL (ref 6–495)
Johnson Grass IgE: 0.11 kU/L — AB
Maple/Box Elder IgE: <0.1 kU/L
Mouse Urine IgE: 0.54 kU/L — AB
Oak, White IgE: <0.1 kU/L
Pecan, Hickory IgE: 0.91 kU/L — AB
Penicillium Chrysogen IgE: <0.1 kU/L
Pigweed, Rough IgE: <0.1 kU/L
Ragweed, Short IgE: 0.16 kU/L — AB
Sheep Sorrel IgE Qn: <0.1 kU/L
Timothy Grass IgE: 2.05 kU/L — AB
White Mulberry IgE: <0.1 kU/L

## 2022-05-09 LAB — ANTINUCLEAR ANTIBODIES, IFA: ANA Titer 1: NEGATIVE

## 2022-05-09 LAB — TRYPTASE: Tryptase: 8.1 ug/L (ref 2.2–13.2)

## 2022-05-09 LAB — PROTEIN ELECTROPHORESIS, SERUM
A/G Ratio: 1.5 (ref 0.7–1.7)
Albumin ELP: 4 g/dL (ref 2.9–4.4)
Alpha 1: 0.2 g/dL (ref 0.0–0.4)
Alpha 2: 0.8 g/dL (ref 0.4–1.0)
Beta: 1 g/dL (ref 0.7–1.3)
Gamma Globulin: 0.8 g/dL (ref 0.4–1.8)
Globulin, Total: 2.7 g/dL (ref 2.2–3.9)

## 2022-05-09 LAB — THYROID ANTIBODIES
Thyroglobulin Antibody: <1 IU/mL (ref 0.0–0.9)
Thyroperoxidase Ab SerPl-aCnc: 22 IU/mL (ref 0–34)

## 2022-05-09 LAB — SEDIMENTATION RATE: Sed Rate: 14 mm/hr (ref 0–32)

## 2022-05-09 LAB — CHRONIC URTICARIA: cu index: <1.7 (ref <10)

## 2022-05-09 LAB — C-REACTIVE PROTEIN: CRP: <1 mg/L (ref 0–10)

## 2022-05-22 NOTE — Telephone Encounter (Signed)
Patient's first ocrevus infusion was 05/14/2022.

## 2022-05-28 ENCOUNTER — Telehealth: Payer: Self-pay | Admitting: Neurology

## 2022-05-28 NOTE — Telephone Encounter (Signed)
Called and LVM relaying Dr. Bonnita Hollow message. Asked her to call back if she has any more questions.

## 2022-05-28 NOTE — Telephone Encounter (Signed)
Dr. Epimenio Foot, I don't see any interactions, ok for pt to take?

## 2022-05-28 NOTE — Telephone Encounter (Signed)
Patient would like to know is it ok to be on dupixent and ocrevus. Dermatologist has started Therapist, occupational for dupixent and patient will see provider for dupixent on Friday.

## 2022-05-30 ENCOUNTER — Ambulatory Visit: Payer: Medicare Other | Admitting: Family Medicine

## 2022-06-17 NOTE — Progress Notes (Signed)
[place], [place] [address] Dept: [phone]  FOLLOW UP NOTE  Patient ID: Lindsey Phelps, female    DOB: [birth year]  Age: 42 y.o. MRN: [id number] Date of Office Visit: 06/18/2022  Assessment  Chief Complaint: Eczema (////)  HPI Lindsey Phelps is a 42 year old female who presents to the clinic for follow-up visit.  She was last seen in this clinic on 05/02/2022 by Dr. Ernst Bowler as a new patient for evaluation of allergic rhinitis, atopic dermatitis, and contact allergic dermatitis to nickel and balsam of [place].  Active problem list includes MS for which she has recently started Ocrevus infusions. At today's visit, she reports her atopic dermatitis remains poorly controlled with scattered eczematous patches occurring on her hands, flanks, and back. She reports these areas are extremely pruritic. She continues a twice a day moisturizing routine as well as Elidel and triamcinolone to stubborn red, itchy areas with mild relief of symptoms.   Allergic rhinitis is reported as moderately well controlled with no symptoms including rhinorrhea or nasal congestions. She continues cetirizine 10 mg once a day and is not currently using a nasal saline or nasal steroid spray.   She continues to avoid products containing nickel or balsam of [place].   Her current medications are listed in the chart.   Drug Allergies:  Allergies  Allergen Reactions   Darvocet [Propoxyphene N-Acetaminophen] Hives   Demerol Hives   Adhesive [Tape] Rash and Other (See Comments)    Including bandaids - takes skin off, peels welts    Physical Exam: BP 118/78   Pulse 67   Temp 98.1 F (36.7 C)   Resp 18   SpO2 97%    Physical Exam Vitals reviewed.  Constitutional:      Appearance: Normal appearance.  HENT:     Head: Normocephalic and atraumatic.     Right Ear: Tympanic membrane normal.     Left Ear: Tympanic membrane normal.     Nose:     Comments: Bilateral nares slightly erythematous  with clear nasal drainage noted. Pharynx normal. Ears normal. Eyes normal.     Mouth/Throat:     Pharynx: Oropharynx is clear.  Eyes:     Conjunctiva/sclera: Conjunctivae normal.  Cardiovascular:     Rate and Rhythm: Normal rate and regular rhythm.     Heart sounds: Normal heart sounds. No murmur heard. Pulmonary:     Effort: Pulmonary effort is normal.     Breath sounds: Normal breath sounds.     Comments: Lungs clear to auscultation Musculoskeletal:        General: Normal range of motion.     Cervical back: Normal range of motion and neck supple.  Skin:    General: Skin is warm.     Comments: Scattered eczematous patches noted on hands and back. No open areas or drainage noted.  Neurological:     Mental Status: She is alert.  Psychiatric:        Mood and Affect: Mood normal.        Judgment: Judgment normal.       Assessment and Plan: 1. Flexural atopic dermatitis   2. Allergic dermatitis due to other chemical product   3. Multiple sclerosis ([place])   4. Chronic rhinitis     Patient Instructions  Allergic rhinitis Continue allergen avoidance measures directed toward cat, dog, mold, grass pollen, tree pollen, weed pollen, and ragweed pollen as listed below. Continue cetirizine 10 mg once a day as needed for a  runny nose Consider saline nasal rinses as needed for nasal symptoms. Use this before any medicated nasal sprays for best result  Atopic dermatitis Continue a twice a day moisturizing routine Continue Elidel to red, itchy areas twice a day as needed Continue triamcinolone to stubborn, red itchy areas below your face twice a day as needed. Do not use this medication longer than 2 weeks in a row We will submit your information to begin [place]. You will next hear from out [place], with next steps.   Contact allergic dermatitis Continue to avoid nickel, and balsam of [place]  Call the clinic if this treatment plan is not working well for  you.  Follow up in 3 months or sooner if needed.   Return in about 3 months (around 09/17/2022), or if symptoms worsen or fail to improve.    Thank you for the opportunity to care for this patient.  Please do not hesitate to contact me with questions.  Gareth Morgan, FNP Allergy and [place] [facility] [place]

## 2022-06-17 NOTE — Patient Instructions (Signed)
Allergic rhinitis Continue allergen avoidance measures directed toward cat, dog, mold, grass pollen, tree pollen, weed pollen, and ragweed pollen as listed below. Continue cetirizine 10 mg once a day as needed for a runny nose Consider saline nasal rinses as needed for nasal symptoms. Use this before any medicated nasal sprays for best result  Atopic dermatitis Continue a twice a day moisturizing routine Continue Elidel to red, itchy areas twice a day as needed Continue triamcinolone to stubborn, red itchy areas below your face twice a day as needed. Do not use this medication longer than 2 weeks in a row We will submit your information to begin [place]. You will next hear from out [place], with next steps.   Contact allergic dermatitis Continue to avoid nickel, and balsam of [place]  Call the clinic if this treatment plan is not working well for you.  Follow up in 3 months or sooner if needed.  Control of Dog or Cat Allergen Avoidance is the best way to manage a dog or cat allergy. If you have a dog or cat and are allergic to dog or cats, consider removing the dog or cat from the home. If you have a dog or cat but don't want to find it a new home, or if your family wants a pet even though someone in the household is allergic, here are some strategies that may help keep symptoms at bay:  Keep the pet out of your bedroom and restrict it to only a few rooms. Be advised that keeping the dog or cat in only one room will not limit the allergens to that room. Don't pet, hug or kiss the dog or cat; if you do, wash your hands with soap and water. High-efficiency particulate air (HEPA) cleaners run continuously in a bedroom or living room can reduce allergen levels over time. Regular use of a high-efficiency vacuum cleaner or a central vacuum can reduce allergen levels. Giving your dog or cat a bath at least once a week can reduce airborne allergen.  Reducing Pollen Exposure The  American Academy of Allergy, Asthma and Immunology suggests the following steps to reduce your exposure to pollen during allergy seasons. Do not hang sheets or clothing out to dry; pollen may collect on these items. Do not mow lawns or spend time around freshly cut grass; mowing stirs up pollen. Keep windows closed at night.  Keep car windows closed while driving. Minimize morning activities outdoors, a time when pollen counts are usually at their highest. Stay indoors as much as possible when pollen counts or humidity is high and on windy days when pollen tends to remain in the air longer. Use air conditioning when possible.  Many air conditioners have filters that trap the pollen spores. Use a HEPA room air filter to remove pollen form the indoor air you breathe.  Control of Mold Allergen Mold and fungi can grow on a variety of surfaces provided certain temperature and moisture conditions exist.  Outdoor molds grow on plants, decaying vegetation and soil.  The major outdoor mold, Alternaria and Cladosporium, are found in very high numbers during hot and dry conditions.  Generally, a late Summer - Fall peak is seen for common outdoor fungal spores.  Rain will temporarily lower outdoor mold spore count, but counts rise rapidly when the rainy period ends.  The most important indoor molds are Aspergillus and Penicillium.  Dark, humid and poorly ventilated basements are ideal sites for mold growth.  The next most common  sites of mold growth are the bathroom and the kitchen.  Outdoor [employer] Use air conditioning and keep windows closed Avoid exposure to decaying vegetation. Avoid leaf raking. Avoid grain handling. Consider wearing a face mask if working in moldy areas.  Indoor Mold Control Maintain humidity below 50%. Clean washable surfaces with 5% bleach solution. Remove sources e.g. Contaminated carpets.

## 2022-06-18 ENCOUNTER — Other Ambulatory Visit: Payer: Self-pay | Admitting: Internal Medicine

## 2022-06-18 ENCOUNTER — Encounter: Payer: Self-pay | Admitting: Family Medicine

## 2022-06-18 ENCOUNTER — Ambulatory Visit: Payer: Managed Care, Other (non HMO) | Admitting: Family Medicine

## 2022-06-18 ENCOUNTER — Ambulatory Visit
Admission: RE | Admit: 2022-06-18 | Discharge: 2022-06-18 | Disposition: A | Discharge status: Home / Self Care | Payer: Managed Care, Other (non HMO) | Source: Ambulatory Visit | Attending: Internal Medicine | Admitting: Internal Medicine

## 2022-06-18 VITALS — BP 118/78 | HR 67 | Temp 98.1°F | Resp 18

## 2022-06-18 DIAGNOSIS — N23 Unspecified renal colic: Secondary | ICD-10-CM

## 2022-06-18 DIAGNOSIS — J310 Chronic rhinitis: Secondary | ICD-10-CM | POA: Diagnosis not present

## 2022-06-18 DIAGNOSIS — L2089 Other atopic dermatitis: Secondary | ICD-10-CM

## 2022-06-18 DIAGNOSIS — G35 Multiple sclerosis: Secondary | ICD-10-CM

## 2022-06-18 DIAGNOSIS — L235 Allergic contact dermatitis due to other chemical products: Secondary | ICD-10-CM | POA: Diagnosis not present

## 2022-06-18 DIAGNOSIS — L259 Unspecified contact dermatitis, unspecified cause: Secondary | ICD-10-CM | POA: Insufficient documentation

## 2022-06-23 ENCOUNTER — Telehealth: Payer: Self-pay | Admitting: *Deleted

## 2022-06-23 NOTE — Telephone Encounter (Signed)
L/m for patient to contact me to advise approval, copay card and submit for Dupixent °

## 2022-06-23 NOTE — Telephone Encounter (Signed)
Thank you :)

## 2022-06-23 NOTE — Telephone Encounter (Signed)
-----  Message from Dara Hoyer, [place] sent at 06/18/2022  4:40 PM EST ----- Hi there, Can you please submit this patient for [place] for atopic dermatitis? She did not have improvement after adding Elidel. Thank you

## 2022-07-02 ENCOUNTER — Ambulatory Visit: Payer: Managed Care, Other (non HMO) | Attending: Cardiology | Admitting: Cardiology

## 2022-07-02 ENCOUNTER — Encounter: Payer: Self-pay | Admitting: Cardiology

## 2022-07-02 VITALS — BP 120/98 | HR 79 | Ht 65.75 in | Wt 201.0 lb

## 2022-07-02 DIAGNOSIS — I209 Angina pectoris, unspecified: Secondary | ICD-10-CM | POA: Diagnosis not present

## 2022-07-02 DIAGNOSIS — Z8249 Family history of ischemic heart disease and other diseases of the circulatory system: Secondary | ICD-10-CM

## 2022-07-02 DIAGNOSIS — E782 Mixed hyperlipidemia: Secondary | ICD-10-CM | POA: Diagnosis not present

## 2022-07-02 NOTE — Patient Instructions (Signed)
Medication Instructions:  Your physician recommends that you continue on your current medications as directed. Please refer to the Current Medication list given to you today.   Labwork: None  Testing/Procedures: Your physician has requested that you have a Exercise myoview. For further information please visit [email or web address]. Please follow instruction sheet, as given.   Follow-Up: Follow up is pending test results.   Any Other Special Instructions Will Be Listed Below (If Applicable).     If you need a refill on your cardiac medications before your next appointment, please call your pharmacy.

## 2022-07-02 NOTE — Progress Notes (Signed)
Cardiology Office Note  Date: 07/02/2022   ID: Lindsey, Phelps [birth year], MRN [id number]  PCP:  Redmond School, MD  Cardiologist:  Rozann Lesches, MD Electrophysiologist:  None   Chief Complaint  Patient presents with   Left arm and neck discomfort    History of Present Illness: Lindsey Phelps is a 42 y.o. female former patient of Dr. Bronson Ing (last seen in 2019) now referred to the office for cardiology consultation by Dr. Sedonia Small with history of left arm pain that was evaluated in the ER back in September 2023 per chart review.  She ruled out for ACS at that time and her ECG showed no acute ST segment changes, chest x-ray without acute findings.  She does relate she has been experiencing an intermittent left neck and left arm shooting discomfort, typically lasting for a few seconds and without obvious precipitant.  This has been going on for several weeks.  Also feels a general discomfort in her left arm when she moves it or lifts things.  She is concerned about possible cardiac etiology given family history of premature CAD, her brother had a myocardial infarction in his 95s.  She reports undergoing a remote treadmill test several years ago, but but no recent cardiac testing.  I reviewed her history.  There was question of previous stroke based on MRI in the past, although she was subsequently diagnosed with multiple sclerosis and is not entirely clear whether she had a true ischemic event or not.  She does follow with a neurologist.  Symptoms due to multiple sclerosis are related to balance and a feeling of right-sided weakness, also intermittent difficulty with speech.  Past Medical History:  Diagnosis Date   Anxiety    Heart palpitations    History of IBS    Hyperlipidemia    Hypertension    Multiple sclerosis ([place])    Stroke [facility])     Past Surgical History:  Procedure Laterality Date   ABDOMINAL HYSTERECTOMY     BLADDER NECK SUSPENSION     CHOLECYSTECTOMY      COLONOSCOPY  01/23/2011   Rehman, normal   LAPAROSCOPIC TUBAL LIGATION     TEE WITHOUT CARDIOVERSION N/A 05/05/2017   Procedure: TRANSESOPHAGEAL ECHOCARDIOGRAM (TEE);  Surgeon: Herminio Commons, MD;  Location: [facility];  Service: Cardiovascular;  Laterality: N/A;    Current Outpatient Medications  Medication Sig Dispense Refill   Acetaminophen (TYLENOL PO) Take by mouth as needed.     amphetamine-dextroamphetamine (ADDERALL XR) 10 MG 24 hr capsule TAKE ONE CAPSULE BY MOUTH DAILY 30 capsule 0   cetirizine-pseudoephedrine (ZYRTEC-D) 5-120 MG tablet Take 1 tablet by mouth 2 (two) times daily.     cyclobenzaprine (FLEXERIL) 5 MG tablet Take up to 3 pills daily as needed 90 tablet 5   diazepam (VALIUM) 10 MG tablet Take 10 mg by mouth at bedtime as needed for anxiety.      doxepin (SINEQUAN) 10 MG capsule TAKE THREE (3) CAPSULES BY MOUTH ONCE DAILY. START WITH ONE CAPSULE AND WORK UP TO THREE TIMES DAILY AS DIRECTED     erythromycin ophthalmic ointment 4 (four) times daily.     halobetasol (ULTRAVATE) 0.05 % cream Apply 1 application  topically 2 (two) times daily as needed.     Loperamide HCl (IMODIUM PO) Take 1 Dose by mouth as needed.     Ocrelizumab (OCREVUS IV) Inject into the vein. Infusion twice per year for MS     promethazine (PHENERGAN) 25 MG tablet  Take 1 tablet (25 mg total) by mouth 2 (two) times daily as needed for nausea or vomiting. 60 tablet 5   triamcinolone cream (KENALOG) 0.1 % SMARTSIG:Sparingly Topical Twice Daily     No current facility-administered medications for this visit.   Facility-Administered Medications Ordered in Other Visits  Medication Dose Route Frequency Provider Last Rate Last Admin   gadopentetate dimeglumine (MAGNEVIST) injection 18 mL  18 mL Intravenous Once PRN Sater, Nanine Means, MD       gadopentetate dimeglumine (MAGNEVIST) injection 18 mL  18 mL Intravenous Once PRN Sater, Nanine Means, MD       Allergies:  Darvocet [propoxyphene  n-acetaminophen], Demerol, and Adhesive [tape]   Social History: The patient  reports that she quit smoking about 9 years ago. Her smoking use included cigarettes. She has a 7.50 pack-year smoking history. She has never used smokeless tobacco. She reports that she does not currently use alcohol. She reports that she does not use drugs.   Family History: The patient's family history includes Allergic rhinitis in her brother; Asthma in her mother; COPD in her mother; Colon polyps in her father; Coronary artery disease in her mother; Diabetes in her father; Hypertension in her father and mother; Lung cancer in her father.   ROS: Occasional sense of palpitations, nothing prolonged.  No syncope.  Physical Exam: VS:  BP (!) 120/98   Pulse 79   Ht 5' 5.75" (1.67 m)   Wt 201 lb (91.2 kg)   BMI 32.69 kg/m , BMI Body mass index is 32.69 kg/m.  Wt Readings from Last 3 Encounters:  07/02/22 201 lb (91.2 kg)  05/02/22 201 lb 6 oz (91.3 kg)  02/21/22 188 lb (85.3 kg)    General: Patient appears comfortable at rest. HEENT: Conjunctiva and lids normal. Neck: Supple, no elevated JVP or carotid bruits. Lungs: Clear to auscultation, nonlabored breathing at rest. Cardiac: Regular rate and rhythm, no S3 or significant systolic murmur, no pericardial rub. Abdomen: Bowel sounds present. Extremities: No pitting edema, distal pulses 2+. Skin: Warm and dry. Musculoskeletal: No kyphosis. Neuropsychiatric: Alert and oriented x3, affect grossly appropriate.  ECG:  An ECG dated 02/21/2022 was personally reviewed today and demonstrated:  Sinus arrhythmia.  Recent Labwork: 02/21/2022: Platelets 263 05/02/2022: ALT 16; AST 18; BUN 14; Creatinine, Ser 0.80; Hemoglobin 12.6; Potassium 4.2; Sodium 143   Other Studies Reviewed Today:  Chest x-ray 02/22/2022: FINDINGS: The heart size and mediastinal contours are within normal limits. Both lungs are clear. The visualized skeletal structures are unremarkable.    IMPRESSION: No active disease.  Assessment and Plan:  Atypical left-sided neck and arm discomfort as discussed above.  ER evaluation in September 2023 was reassuring in terms of ACS.  Cannot exclude atypical angina.  She does have a family history of premature CAD including a brother who had a myocardial infarction in his mid 72s.  She has a remote history of tobacco use, questionable stroke history (may have actually been multiple sclerosis finding by MRI), and apparently diet managed hyperlipidemia.  She has not undergone any ischemic testing recently and we will plan on a exercise Myoview (can convert to [place] if necessary).  Medication Adjustments/Labs and Tests Ordered: Current medicines are reviewed at length with the patient today.  Concerns regarding medicines are outlined above.   Tests Ordered: Orders Placed This Encounter  Procedures   NM Myocar Multi W/Spect W/Wall Motion / EF    Medication Changes: No orders of the defined types were placed in this  encounter.   Disposition:  Follow up  test results.  Signed, Satira Sark, MD, [facility] 07/02/2022 1:45 PM    [facility] at [facility] [address] [place], [place] [address] Phone: [phone]; Fax: [phone]

## 2022-07-07 ENCOUNTER — Telehealth: Payer: Self-pay | Admitting: Cardiology

## 2022-07-07 DIAGNOSIS — I209 Angina pectoris, unspecified: Secondary | ICD-10-CM

## 2022-07-07 NOTE — Telephone Encounter (Signed)
Insurance will not cover pt's stress test. Patient is asking for another type of test if possible.   Please advise.

## 2022-07-07 NOTE — Telephone Encounter (Signed)
Pt called and stated her Stress Test scheduled for 2/8 was cancelled due to ins will not cover.  She stated she would like to know where to go from here and what she needs to do now?    Best number  [phone]

## 2022-07-07 NOTE — Telephone Encounter (Signed)
Patient notified via [place]. New order entered for GXT per provider.

## 2022-07-10 ENCOUNTER — Other Ambulatory Visit (HOSPITAL_COMMUNITY): Payer: Managed Care, Other (non HMO)

## 2022-07-10 ENCOUNTER — Encounter (HOSPITAL_COMMUNITY): Payer: Managed Care, Other (non HMO)

## 2022-07-28 ENCOUNTER — Ambulatory Visit (INDEPENDENT_AMBULATORY_CARE_PROVIDER_SITE_OTHER): Payer: Managed Care, Other (non HMO)

## 2022-07-28 DIAGNOSIS — L209 Atopic dermatitis, unspecified: Secondary | ICD-10-CM

## 2022-07-28 MED ORDER — DUPILUMAB 300 MG/2ML ~~LOC~~ SOSY
300.0000 mg | PREFILLED_SYRINGE | SUBCUTANEOUS | Status: AC
  Administered 2022-07-28: 300 mg via SUBCUTANEOUS

## 2022-07-28 NOTE — Progress Notes (Signed)
Immunotherapy   Patient Details  Name: Lindsey Phelps MRN: [id number] Date of Birth: Jul 10, 1980  07/28/2022  Lindsey Phelps started injections for  Dupixent. She expressed that she wanted to learn to do injections at home.  Following schedule:  Every fourteen days. Frequency:Every two weeks Epi-Pen:Not needed. Consent signed in office today and patient instructions given. Patient was shown how to administer into the abdomen and the thigh. I injected one syringe into her abdomen then she did one herself. Patient is to come back in two weeks to show that she is comfortable and ready to inject herself on her own with no assistance. Patient and her mother waited in the lobby for thirty minute without an issue.    Julius Bowels 07/28/2022, 5:34 PM

## 2022-08-11 ENCOUNTER — Ambulatory Visit: Payer: Medicare Other

## 2022-08-28 ENCOUNTER — Encounter: Payer: Self-pay | Admitting: Neurology

## 2022-08-28 ENCOUNTER — Ambulatory Visit: Payer: Managed Care, Other (non HMO) | Admitting: Neurology

## 2022-08-28 VITALS — BP 146/93 | HR 80 | Ht 66.0 in | Wt 202.5 lb

## 2022-08-28 DIAGNOSIS — R200 Anesthesia of skin: Secondary | ICD-10-CM

## 2022-08-28 DIAGNOSIS — R21 Rash and other nonspecific skin eruption: Secondary | ICD-10-CM | POA: Diagnosis not present

## 2022-08-28 DIAGNOSIS — M791 Myalgia, unspecified site: Secondary | ICD-10-CM

## 2022-08-28 DIAGNOSIS — F09 Unspecified mental disorder due to known physiological condition: Secondary | ICD-10-CM

## 2022-08-28 DIAGNOSIS — R5383 Other fatigue: Secondary | ICD-10-CM

## 2022-08-28 DIAGNOSIS — G35 Multiple sclerosis: Secondary | ICD-10-CM

## 2022-08-28 DIAGNOSIS — Z79899 Other long term (current) drug therapy: Secondary | ICD-10-CM | POA: Diagnosis not present

## 2022-08-28 DIAGNOSIS — L2089 Other atopic dermatitis: Secondary | ICD-10-CM

## 2022-08-28 MED ORDER — AMPHETAMINE-DEXTROAMPHET ER 10 MG PO CP24: 10.0000 mg | ORAL_CAPSULE | Freq: Every day | ORAL | 0 refills | Status: DC

## 2022-08-28 MED ORDER — DULOXETINE HCL 60 MG PO CPEP: ORAL_CAPSULE | ORAL | 11 refills | Status: DC

## 2022-08-28 NOTE — Progress Notes (Signed)
[facility]  PATIENT: Lindsey Phelps DOB: [birth year]  REFERRING DOCTOR OR PCP:  [facility] SOURCE: Patient, notes from Dr. Gerarda Fraction, laboratory results.  _________________________________   HISTORICAL  CHIEF COMPLAINT:  Chief Complaint  Patient presents with   Room 11    Pt is here Alone. Pt states that she is having body aches all over since doing her infusions. Pt states that she has some tingling and numbness in her hands and her feet. Pt states that everything else has been going good.     HISTORY OF PRESENT ILLNESS:  Lindsey Phelps is a 42 y.o. woman with relapsing remiting MS with multiple tumefactive lesions.  Update 08/28/2022: She had a blistering skin condition.   She saw dermatology and  had a biopsy and was told she had atopic dermatitis but that has been questioned.  We stopped [name] as it may have played a role.  Stopping it has helped the skin issues.  She also has started Dupixent.   We switched to [place] from [place].  She has had some infusion reactions.  She has more body aches, joint pain since the second infusion.   Muscles feel sore.  Flexeril has not helped.   She cannot tolerate gabapentin.   She had tried an old oxycodone and it helped.     She continues to have right occipital tenderness,   The pain was helped by occipital nerve blocks/trigger point injections but for just 10 days.       Gait is mildly off-balanced.  She has residual mild right leg weakness affecting gait at times since the 2019 episode    She has tingling and dysesthesias in the legs, right greater than left.  She has more pain and tingling numbness in the hands.    NCV/EMG 07/19/2019 did not show any neuropathy or evidence of radiculopathy. She has some urinary urgency but no incontinence.   She has word finding difficulties at times and reduced STM/focus.   She also has fatigue.   Adderall has helped with cognitive issues and fatigue..  Valium at night helps her  sleep.     She has some anxiety and agoraphobia.     .    MS History: In 03/31/2017 she experienced word finding difficulties and an MRI of the brain was performed concerning for a subacute left frontal stroke. There were a few other punctate T2/FLAIR hyperintense foci consistent with minimal chronic microvascular ischemic change.  Stroke work-up was performed.  I was most concerned about an embolic etiology. The carotid ultrasound was essentially normal. The cardiac event monitor was essentially normal.  A TEE showed a normal ejection fraction of 60-65 percent.   There was no evidence of a patent foramen ovale or other right-to-left shunt. The study was felt to be normal.  She is on aspirin, metoprolol and Crestor.    A subsequent MRI 07/06/2017 showed multiple areas of juxtacortical edema and enhancement, most consistent with demyelination.  A biopsy was considered but due to his improvement on the subsequent MRI, more consistent with demyelination then tumor, this was held.  She was started on Tysabri 07/2017.  MRI 11/10/2017 was stable.  Due to tolerability issues, she stopped May 2020 and started on [name].  Imaging: MRI of the brain 04/07/2017 showed a partially enhancing focus in the left frontal lobe.  There were a couple punctate T2/FLAIR hyperintense foci elsewhere that were not nonspecific.  MRI of the brain 07/06/2017 showed multiple juxtacortical and periventricular enhancing lesions.  MRI of the brain 07/28/2017 showed improvement of several lesions noted on the previous MRI though one of the foci was larger.  MRI of the brain 11/10/2017 showed no new lesions and resolution of the enhancement seen on the previous MRI  MRI of the brain 03/03/2018 showed no new lesions  MRI of the brain 11/03/2018 showed multiple juxtacortical and periventricular white matter foci.  None of these were new.  They do not enhance.  MRI of the cervical and thoracic spine 11/03/2018 showed a normal spinal cord.  MRI  of the brain 05/10/2019 showed no new lesions.  MRI of the brain 05/09/2020 showed multiple T2/FLAIR hyperintense foci, predominantly in the juxtacortical white matter.  None of the foci enhances or appears to be acute.  Compared to the MRI dated 05/10/2019, there are no new lesions.   MRi of the brain 05/22/2021 was stable.    REVIEW OF SYSTEMS: Constitutional: No fevers, chills, sweats, or change in appetite.   She has mild sleep onset insomnia Eyes: No visual changes, double vision, eye pain Ear, nose and throat: No hearing loss, ear pain, nasal congestion, sore throat Cardiovascular: No chest pain.  Has intermittent tachycardia.  Respiratory:  No shortness of breath at rest or with exertion.   No wheezes GastrointestinaI: No nausea, vomiting, diarrhea, abdominal pain, fecal incontinence Genitourinary:  No dysuria, urinary retention or frequency.  No nocturia. Musculoskeletal: Neck pain as above.   Integumentary: No rash, pruritus, skin lesions Neurological: as above Psychiatric: No depression at this time.  H/o panic attacks. Endocrine: No palpitations, diaphoresis, change in appetite, change in weigh or increased thirst Hematologic/Lymphatic:  No anemia, purpura, petechiae. Allergic/Immunologic: has some seasonal allergies  ALLERGIES: Allergies  Allergen Reactions   Darvocet [Propoxyphene N-Acetaminophen] Hives   Demerol Hives   Adhesive [Tape] Rash and Other (See Comments)    Including bandaids - takes skin off, peels welts    HOME MEDICATIONS:  Current Outpatient Medications:    Acetaminophen (TYLENOL PO), Take by mouth as needed., Disp: , Rfl:    Cetirizine HCl (ZYRTEC ALLERGY) 10 MG CAPS, Take 10 mg by mouth daily., Disp: , Rfl:    cyclobenzaprine (FLEXERIL) 5 MG tablet, Take up to 3 pills daily as needed, Disp: 90 tablet, Rfl: 5   diazepam (VALIUM) 10 MG tablet, Take 10 mg by mouth at bedtime as needed for anxiety. , Disp: , Rfl:    DULoxetine (CYMBALTA) 60 MG capsule,  One po qd, Disp: 30 capsule, Rfl: 11   Loperamide HCl (IMODIUM PO), Take 1 Dose by mouth as needed., Disp: , Rfl:    Ocrelizumab (OCREVUS IV), Inject into the vein. Infusion twice per year for MS, Disp: , Rfl:    promethazine (PHENERGAN) 25 MG tablet, Take 1 tablet (25 mg total) by mouth 2 (two) times daily as needed for nausea or vomiting., Disp: 60 tablet, Rfl: 5   amphetamine-dextroamphetamine (ADDERALL XR) 10 MG 24 hr capsule, Take 1 capsule (10 mg total) by mouth daily., Disp: 30 capsule, Rfl: 0   erythromycin ophthalmic ointment, 4 (four) times daily. (Patient not taking: Reported on 08/28/2022), Disp: , Rfl:   Current Facility-Administered Medications:    dupilumab (DUPIXENT) prefilled syringe 300 mg, 300 mg, Subcutaneous, Q14 Days, Valentina Shaggy, MD, 300 mg at 07/28/22 1459  Facility-Administered Medications Ordered in Other Visits:    gadopentetate dimeglumine (MAGNEVIST) injection 18 mL, 18 mL, Intravenous, Once PRN, Carlon Chaloux A, MD   gadopentetate dimeglumine (MAGNEVIST) injection 18 mL, 18 mL, Intravenous, Once  PRN, Magie Ciampa, Nanine Means, MD  PAST MEDICAL HISTORY: Past Medical History:  Diagnosis Date   Anxiety    Heart palpitations    History of IBS    Hyperlipidemia    Hypertension    Multiple sclerosis ([place])    Stroke [facility])     PAST SURGICAL HISTORY: Past Surgical History:  Procedure Laterality Date   ABDOMINAL HYSTERECTOMY     BLADDER NECK SUSPENSION     CHOLECYSTECTOMY     COLONOSCOPY  01/23/2011   Rehman, normal   LAPAROSCOPIC TUBAL LIGATION     TEE WITHOUT CARDIOVERSION N/A 05/05/2017   Procedure: TRANSESOPHAGEAL ECHOCARDIOGRAM (TEE);  Surgeon: Herminio Commons, MD;  Location: [facility];  Service: Cardiovascular;  Laterality: N/A;    FAMILY HISTORY: Family History  Problem Relation Age of Onset   Asthma Mother    Hypertension Mother    Coronary artery disease Mother    COPD Mother    Diabetes Father    Hypertension Father    Colon  polyps Father    Lung cancer Father    Allergic rhinitis Brother     SOCIAL HISTORY:  Social History   Socioeconomic History   Marital status: Married    Spouse name: Not on file   Number of children: Not on file   Years of education: Not on file   Highest education level: Not on file  Occupational History   Occupation: on disability  Tobacco Use   Smoking status: Former    Packs/day: 0.50    Years: 15.00    Additional pack years: 0.00    Total pack years: 7.50    Types: Cigarettes    Quit date: 09/16/2012    Years since quitting: 9.9   Smokeless tobacco: Never  Vaping Use   Vaping Use: Never used  Substance and Sexual Activity   Alcohol use: Not Currently    Comment: occ   Drug use: No   Sexual activity: Yes    Birth control/protection: Surgical  Other Topics Concern   Not on file  Social History Narrative   Right Handed   2-3 Sodas per day   Social Determinants of Health   Financial Resource Strain: Not on file  Food Insecurity: Not on file  Transportation Needs: Not on file  Physical Activity: Not on file  Stress: Not on file  Social Connections: Not on file  Intimate Partner Violence: Not on file     PHYSICAL EXAM  Vitals:   08/28/22 1124  BP: (!) 146/93  Pulse: 80  Weight: 202 lb 8 oz (91.9 kg)  Height: 5\' 6"  (1.676 m)    Body mass index is 32.68 kg/m.   General: The patient is well-developed and well-nourished and in no acute distress.   She has tenderness in muscles of forearm and joins of hands.  Neurologic Exam  Mental status: She is alert and oriented x 3 at the time of the examination. The patient has apparent normal recent and remote memory, with an apparently normal attention span and concentration ability.  Speech was fine.    Cranial nerves:   Color vision is symmetric.  Color vision is mildly desaturated OD.  Extraocular movements are full.  Facial strength and sensation was normal.. No obvious hearing deficits are  noted.  Motor:  Muscle bulk is normal.   Tone is normal.  Strength 5/5.  I Sensory:   She has mildly reduced touch and vibration on right arm and mildly reduced vibration and touch in  the right leg leg compared to left  Coordination: Cerebellar testing reveals good finger-nose-finger and left heel-to-shin.  She has reduced right heel-to-shin.  Gait and station: Station is normal.  The gait is mildly wide.  Her tandem is wide.   Romberg is negative.  Reflexes: Deep tendon reflexes are symmetric and slightly increased in the arms, increased at the knees with crossed adductors, right >left.  She does not have ankle clonus.        DIAGNOSTIC DATA (LABS, IMAGING, TESTING) - I reviewed patient records, labs, notes, testing and imaging myself where available.  Lab Results  Component Value Date   WBC 4.8 05/02/2022   HGB 12.6 05/02/2022   HCT 38.7 05/02/2022   MCV 90 05/02/2022   PLT 263 02/21/2022      Component Value Date/Time   NA 143 05/02/2022 1516   K 4.2 05/02/2022 1516   CL 105 05/02/2022 1516   CO2 23 05/02/2022 1516   GLUCOSE 84 05/02/2022 1516   GLUCOSE 90 02/21/2022 2314   BUN 14 05/02/2022 1516   CREATININE 0.80 05/02/2022 1516   CALCIUM 9.3 05/02/2022 1516   PROT 6.7 05/02/2022 1516   ALBUMIN 4.3 05/02/2022 1516   AST 18 05/02/2022 1516   ALT 16 05/02/2022 1516   ALKPHOS 77 05/02/2022 1516   BILITOT 0.3 05/02/2022 1516   GFRNONAA >60 02/21/2022 2314   GFRAA 107 04/30/2020 1147       ASSESSMENT AND PLAN  1. Multiple sclerosis ([place])   2. High risk medication use   3. Blistering rash   4. Other fatigue   5. Cognitive deficit secondary to MS ([place])   6. Numbness   7. Myalgia   8. Flexural atopic dermatitis      1.  Continue Ocrevus.   Ok to take [place].   We will check labs at next visit    Check MRI around time of next visit. 2.  Continue Adderall XR 10 milligrams nad renew.  She had trouble tolerating higher doses. 3.  Cymbalta for myalgias   4.   Return to see me in 6 months or sooner if there are new or worsening neurologic symptoms.      Juanmiguel Defelice A. Felecia Shelling, MD, PhD, FAAN Certified in Neurology, Clinical Neurophysiology, Sleep Medicine, Pain Medicine and Neuroimaging Director, [place] [facility] [address], [place], [place] [address] [phone]

## 2022-09-17 ENCOUNTER — Ambulatory Visit: Payer: Managed Care, Other (non HMO) | Admitting: Allergy & Immunology

## 2022-09-17 ENCOUNTER — Other Ambulatory Visit: Payer: Self-pay

## 2022-09-17 ENCOUNTER — Encounter: Payer: Self-pay | Admitting: Allergy & Immunology

## 2022-09-17 VITALS — BP 138/80 | HR 68 | Temp 98.1°F | Resp 16 | Ht 66.0 in | Wt 203.0 lb

## 2022-09-17 DIAGNOSIS — J302 Other seasonal allergic rhinitis: Secondary | ICD-10-CM

## 2022-09-17 DIAGNOSIS — L235 Allergic contact dermatitis due to other chemical products: Secondary | ICD-10-CM

## 2022-09-17 DIAGNOSIS — J3089 Other allergic rhinitis: Secondary | ICD-10-CM

## 2022-09-17 DIAGNOSIS — L2089 Other atopic dermatitis: Secondary | ICD-10-CM | POA: Diagnosis not present

## 2022-09-17 MED ORDER — AMOXICILLIN-POT CLAVULANATE 875-125 MG PO TABS: 1.0000 | ORAL_TABLET | Freq: Two times a day (BID) | ORAL | 0 refills | Status: AC

## 2022-09-17 NOTE — Patient Instructions (Addendum)
Allergic rhinitis - Continue allergen avoidance measures directed toward cat, dog, mold, grass pollen, tree pollen, weed pollen, and ragweed pollen. - Continue cetirizine 10 mg once a day as needed for a runny nose - Consider saline nasal rinses as needed for nasal symptoms. Use this before any medicated nasal sprays for best result  2. Atopic dermatitis - Continue a twice a day moisturizing routine - Continue Elidel to red, itchy areas twice a day as needed - Continue triamcinolone to stubborn, red itchy areas below your face twice a day as needed. Do not use this medication longer than 2 weeks in a row - Continue with Dupixent every two weeks.  3. Contact allergic dermatitis - Continue to avoid nickel, and balsam of [place]  4. Return in about 1 year (around 09/17/2023).    Please inform us of any Emergency Department visits, hospitalizations, or changes in symptoms. Call us before going to the ED for breathing or allergy symptoms since we might be able to fit you in for a sick visit. Feel free to contact us anytime with any questions, problems, or concerns.  It was a pleasure to see you again today!  Websites that have reliable patient information: 1. American Academy of Asthma, Allergy, and Immunology: www.aaaai.org 2. Food Allergy Research and Education (FARE): foodallergy.org 3. Mothers of Asthmatics: http://www.asthmacommunitynetwork.org 4. American College of Allergy, Asthma, and Immunology: www.acaai.org   COVID-19 Vaccine Information can be found at: [email or web address] For questions related to vaccine distribution or appointments, please email [email or web address] or call [phone].   We realize that you might be concerned about having an allergic reaction to the COVID19 vaccines. To help with that concern, WE ARE OFFERING THE COVID19 VACCINES IN OUR OFFICE! Ask the front desk for dates!     "Like" [place] on Facebook  and Instagram for our latest updates!      A healthy democracy works best when [employer] participate! Make sure you are registered to vote! If you have moved or changed any of your contact information, you will need to get this updated before voting!  In some cases, you MAY be able to register to vote online: [email or web address]

## 2022-09-17 NOTE — Progress Notes (Signed)
FOLLOW UP  Date of Service/Encounter:  09/17/22   Assessment:   Flexural atopic dermatitis - followed by Dr. Leonie Man    Allergic dermatitis due to other chemical product (nickel and balsam of [place])   Multiple sclerosis - doing well on Ocrevus   Stye - somewhat responsive warm compresses (adding on Augmentin today)   Plan/Recommendations:    Perennial and seasonal allergic rhinitis (grasses, trees, weeds, ragweed, cat, dog, mold) - Continue allergen avoidance measures directed toward cat, dog, mold, grass pollen, tree pollen, weed pollen, and ragweed pollen. - Continue cetirizine 10 mg once a day as needed for a runny nose - Consider saline nasal rinses as needed for nasal symptoms. Use this before any medicated nasal sprays for best result  2. Atopic dermatitis - Continue a twice a day moisturizing routine - Continue Elidel to red, itchy areas twice a day as needed - Continue triamcinolone to stubborn, red itchy areas below your face twice a day as needed. Do not use this medication longer than 2 weeks in a row - Continue with Dupixent every two weeks.  3. Contact allergic dermatitis - Continue to avoid nickel, and balsam of [place]  4. Return in about 1 year (around 09/17/2023).    Subjective:   Lindsey Phelps is a 42 y.o. female presenting today for follow up of  Chief Complaint  Patient presents with   Follow-up    Lindsey Phelps has a history of the following: Patient Active Problem List   Diagnosis Date Noted   Chronic rhinitis 06/18/2022   Dermatitis venenata 06/18/2022   Flexural atopic dermatitis 06/18/2022   Blistering rash 02/20/2022   Nausea without vomiting 04/09/2021   Irritable bowel syndrome with diarrhea 04/09/2021   Nausea 10/31/2020   High risk medication use 10/24/2019   Cognitive deficit secondary to MS 12/14/2018   Other fatigue 12/14/2018   Other headache syndrome 10/22/2018   Neck pain 03/08/2018   Urinary dysfunction 11/20/2017    Multiple sclerosis 07/06/2017   Speech disturbance 06/11/2017   Stroke 04/07/2017   Numbness 03/31/2017   Vertigo 03/31/2017   Slurred speech 03/31/2017   Vision disturbance 03/31/2017   Anxiety 03/31/2017   Gait disturbance 03/31/2017   Leg swelling 12/30/2013   Palpitations 09/16/2013   Chest pain 09/16/2013    History obtained from: chart review and patient.  Lindsey Phelps is a 42 y.o. female presenting for a follow up visit.  We last saw her in January 2024.  At that time, her skin was under good control with triamcinolone and Elidel as well as Dupixent.  She continues to avoid nickel as well as balsam of [place].  For her allergic rhinitis, she was continued on Zyrtec as well as nasal saline rinses.  Since last visit, she has done very well.  Allergic Rhinitis Symptom History: Allergic rhinitis has improved since starting the Dupixent. Typically she will require prednisone several times during this time of the year. She is outside planting flowers and whatnot.  Does not think that she needs any allergy shots.  She has not really used much in the way of any over-the-counter medications including antihistamines or nasal sprays.  Overall, she is doing very well.  She has not had any sinus infections.  Since yesterday  Skin Symptom History: She is giving herself the injections in her thighs. Sometimes she gets them in her arms from her husband and her mother. She uses prefilled syringes. She is not using any topicals at all. She takes cetirizine  nightly and this seems to be working.  She has done very well with this regimen.  She was very happy that she ended up starting it.  Her multiple sclerosis is under good control with Ocrevus.  She is getting regular scans to make sure that this is continuing to work. Thus far, everything has been well controlled.   Otherwise, there have been no changes to her past medical history, surgical history, family history, or social history.    Review of  Systems  Constitutional: Negative.  Negative for fever, malaise/fatigue and weight loss.  HENT: Negative.  Negative for congestion, ear discharge and ear pain.   Eyes:  Negative for pain, discharge and redness.  Respiratory:  Negative for cough, sputum production, shortness of breath and wheezing.   Cardiovascular: Negative.  Negative for chest pain and palpitations.  Gastrointestinal:  Negative for abdominal pain, heartburn, nausea and vomiting.  Skin:  Positive for itching and rash.  Neurological:  Negative for dizziness and headaches.  Endo/Heme/Allergies:  Positive for environmental allergies. Does not bruise/bleed easily.       Objective:   Blood pressure 138/80, pulse 68, temperature 98.1 F (36.7 C), resp. rate 16, height  (1.676 m), weight 203 lb (92.1 kg). Body mass index is 32.77 kg/m.    Physical Exam Constitutional:      Appearance: She is well-developed.  HENT:     Head: Normocephalic and atraumatic.     Right Ear: Tympanic membrane, ear canal and external ear normal. No drainage, swelling or tenderness. Tympanic membrane is not injected, scarred, erythematous, retracted or bulging.     Left Ear: Tympanic membrane, ear canal and external ear normal. No drainage, swelling or tenderness. Tympanic membrane is not injected, scarred, erythematous, retracted or bulging.     Nose: No nasal deformity, septal deviation, mucosal edema or rhinorrhea.     Right Turbinates: Enlarged and swollen.     Left Turbinates: Enlarged and swollen.     Right Sinus: No maxillary sinus tenderness or frontal sinus tenderness.     Left Sinus: No maxillary sinus tenderness or frontal sinus tenderness.     Mouth/Throat:     Mouth: Mucous membranes are not pale and not dry.     Pharynx: Uvula midline.  Eyes:     General: Lids are normal. Allergic shiner present.        Right eye: No discharge.        Left eye: No discharge.     Conjunctiva/sclera: Conjunctivae normal.     Right eye:  Right conjunctiva is not injected. No chemosis.    Left eye: Left conjunctiva is not injected. No chemosis.    Pupils: Pupils are equal, round, and reactive to light.  Cardiovascular:     Rate and Rhythm: Normal rate and regular rhythm.     Heart sounds: Normal heart sounds.  Pulmonary:     Effort: Pulmonary effort is normal. No tachypnea, accessory muscle usage or respiratory distress.     Breath sounds: Normal breath sounds. No wheezing, rhonchi or rales.     Comments: Moving air well in all lung fields. No increased work of breathing noted. Chest:     Chest wall: No tenderness.  Lymphadenopathy:     Head:     Right side of head: No submandibular, tonsillar or occipital adenopathy.     Left side of head: No submandibular, tonsillar or occipital adenopathy.     Cervical: No cervical adenopathy.  Skin:    General: Skin  is warm.     Capillary Refill: Capillary refill takes less than 2 seconds.     Coloration: Skin is not pale.     Findings: No abrasion, erythema, petechiae or rash. Rash is not papular, urticarial or vesicular.     Comments: Eczematous rash looks much better than when I first met her.  Neurological:     Mental Status: She is alert.  Psychiatric:        Behavior: Behavior is cooperative.      Diagnostic studies: none      Malachi Bonds, MD  [facility]

## 2022-09-24 ENCOUNTER — Telehealth: Payer: Self-pay

## 2022-09-24 NOTE — Telephone Encounter (Signed)
Patient called stating her eye still looks and feels the same from her visit on 09/17/2022. Patient states today is the last day of the antibiotic. Patient is wondering what else she can do to help her eye?  [facility]

## 2022-09-25 MED ORDER — MOXIFLOXACIN HCL 0.5 % OP SOLN: 1.0000 [drp] | Freq: Three times a day (TID) | OPHTHALMIC | 0 refills | Status: AC

## 2022-09-25 NOTE — Telephone Encounter (Signed)
Patient call back about a missed called, I read her the notes from Dr. Dellis Anes. Patient verbalized understanding and will reach out to her eye Dr.

## 2022-09-25 NOTE — Telephone Encounter (Signed)
Thank you! I called and I left a message to give the office a callback to inform of note.

## 2022-09-25 NOTE — Telephone Encounter (Signed)
I am sending in some eyedrops to use as well, but I am not sure that it will help much since the systemic antibiotic did not do much.  Does she have an eye doctor?  She should probably go see an eye doctor such as an optometrist or ophthalmologist.  I do not want to give her any prednisone in case it is an eye condition that will worsen with systemic steroids.

## 2023-03-10 ENCOUNTER — Encounter: Payer: Self-pay | Admitting: Neurology

## 2023-03-10 ENCOUNTER — Telehealth: Payer: Self-pay | Admitting: Neurology

## 2023-03-10 ENCOUNTER — Ambulatory Visit (INDEPENDENT_AMBULATORY_CARE_PROVIDER_SITE_OTHER): Payer: Managed Care, Other (non HMO) | Admitting: Neurology

## 2023-03-10 VITALS — BP 132/92 | HR 73 | Ht 66.0 in | Wt 203.5 lb

## 2023-03-10 DIAGNOSIS — Z79899 Other long term (current) drug therapy: Secondary | ICD-10-CM

## 2023-03-10 DIAGNOSIS — G35 Multiple sclerosis: Secondary | ICD-10-CM

## 2023-03-10 DIAGNOSIS — M791 Myalgia, unspecified site: Secondary | ICD-10-CM | POA: Diagnosis not present

## 2023-03-10 DIAGNOSIS — M2550 Pain in unspecified joint: Secondary | ICD-10-CM | POA: Diagnosis not present

## 2023-03-10 DIAGNOSIS — G35D Multiple sclerosis, unspecified: Secondary | ICD-10-CM

## 2023-03-10 MED ORDER — AMPHETAMINE-DEXTROAMPHET ER 15 MG PO CP24: ORAL_CAPSULE | ORAL | 0 refills | Status: DC

## 2023-03-10 NOTE — Progress Notes (Signed)
[facility]  PATIENT: Lindsey Phelps DOB: 1980/06/27  REFERRING DOCTOR OR PCP:  Elfredia Nevins SOURCE: Patient, notes from Dr. Sherwood Gambler, laboratory results.  _________________________________   HISTORICAL  CHIEF COMPLAINT:  Chief Complaint  Patient presents with   Room 10    Pt is here Alone. Pt states that she has pain in every joint in her body. Pt states that within the past several months she has a lot of Fatigue more than normal. Pt states that she feels crappy, but not sick crappy.    HISTORY OF PRESENT ILLNESS:  Lindsey Phelps is a 42 y.o. woman with relapsing remiting MS with multiple tumefactive lesions.  Update 03/06/2023: We switched from [name] to Ocrevus.  Next one will be in November.  She has had some infusion reactions.  She has more body aches, joint pain since the second infusion.   Muscles feel sore.  Flexeril has not helped.   She cannot tolerate gabapentin.   She had tried an old oxycodone and it helped.    She started Dupixent (for her skin rash felt to be due to [facility] in March 2024 (pain preceded it).    She notes less right occipital tenderness, not too bad currently.  The pain was helped by occipital nerve blocks/trigger point injections but for just 10 days  She tried duloxetine for her myalgias but she felt strange and stopped.  Gabapentin made her sleepy.       Gait is mildly off-balanced.  She has residual mild right leg weakness affecting gait at times since the 2019 episode    She has tingling and dysesthesias in the legs, right greater than left.  She has more pain and tingling numbness in the hands.    NCV/EMG 07/19/2019 did not show any neuropathy or evidence of radiculopathy. She has some urinary urgency but no incontinence.   She has word finding difficulties at times and reduced STM/focus.    Valium at night helps her sleep.   She still sometimes wakes up.   She has some anxiety and agoraphobia.     She has more fatigue than  earlier this year.   Adderall has helped with cognitive issues and fatigue a bit.  Dose was reduced to 10 mg last year due to s.e.   She is tolerating 10 mg very well and we discussed a small increase   .    MS History: In 03/31/2017 she experienced word finding difficulties and an MRI of the brain was performed concerning for a subacute left frontal stroke. There were a few other punctate T2/FLAIR hyperintense foci consistent with minimal chronic microvascular ischemic change.  Stroke work-up was performed.  I was most concerned about an embolic etiology. The carotid ultrasound was essentially normal. The cardiac event monitor was essentially normal.  A TEE showed a normal ejection fraction of 60-65 percent.   There was no evidence of a patent foramen ovale or other right-to-left shunt. The study was felt to be normal.  She is on aspirin, metoprolol and Crestor.    A subsequent MRI 07/06/2017 showed multiple areas of juxtacortical edema and enhancement, most consistent with demyelination.  A biopsy was considered but due to his improvement on the subsequent MRI, more consistent with demyelination then tumor, this was held.  She was started on Tysabri 07/2017.  MRI 11/10/2017 was stable.  Due to tolerability issues, she stopped May 2020 and started on [name].  Imaging: MRI of the brain 04/07/2017 showed a partially enhancing  focus in the left frontal lobe.  There were a couple punctate T2/FLAIR hyperintense foci elsewhere that were not nonspecific.  MRI of the brain 07/06/2017 showed multiple juxtacortical and periventricular enhancing lesions.  MRI of the brain 07/28/2017 showed improvement of several lesions noted on the previous MRI though one of the foci was larger.  MRI of the brain 11/10/2017 showed no new lesions and resolution of the enhancement seen on the previous MRI  MRI of the brain 03/03/2018 showed no new lesions  MRI of the brain 11/03/2018 showed multiple juxtacortical and periventricular  white matter foci.  None of these were new.  They do not enhance.  MRI of the cervical and thoracic spine 11/03/2018 showed a normal spinal cord.  MRI of the brain 05/10/2019 showed no new lesions.  MRI of the brain 05/09/2020 showed multiple T2/FLAIR hyperintense foci, predominantly in the juxtacortical white matter.  None of the foci enhances or appears to be acute.  Compared to the MRI dated 05/10/2019, there are no new lesions.   MRi of the brain 05/22/2021 was stable.    REVIEW OF SYSTEMS: Constitutional: No fevers, chills, sweats, or change in appetite.   She has mild sleep onset insomnia Eyes: No visual changes, double vision, eye pain Ear, nose and throat: No hearing loss, ear pain, nasal congestion, sore throat Cardiovascular: No chest pain.  Has intermittent tachycardia.  Respiratory:  No shortness of breath at rest or with exertion.   No wheezes GastrointestinaI: No nausea, vomiting, diarrhea, abdominal pain, fecal incontinence Genitourinary:  No dysuria, urinary retention or frequency.  No nocturia. Musculoskeletal: Neck pain as above.   Integumentary: No rash, pruritus, skin lesions Neurological: as above Psychiatric: No depression at this time.  H/o panic attacks. Endocrine: No palpitations, diaphoresis, change in appetite, change in weigh or increased thirst Hematologic/Lymphatic:  No anemia, purpura, petechiae. Allergic/Immunologic: has some seasonal allergies  ALLERGIES: Allergies  Allergen Reactions   Darvocet [Propoxyphene N-Acetaminophen] Hives   Demerol Hives   Doxycycline Other (See Comments)    BLISTERS AND HIVES   Adhesive [Tape] Rash and Other (See Comments)    Including bandaids - takes skin off, peels welts    HOME MEDICATIONS:  Current Outpatient Medications:    Acetaminophen (TYLENOL PO), Take by mouth as needed., Disp: , Rfl:    Cetirizine HCl (ZYRTEC ALLERGY) 10 MG CAPS, Take 10 mg by mouth daily., Disp: , Rfl:    Cholecalciferol (D3 ADULT PO),  Take by mouth., Disp: , Rfl:    cyclobenzaprine (FLEXERIL) 5 MG tablet, Take up to 3 pills daily as needed, Disp: 90 tablet, Rfl: 5   diazepam (VALIUM) 10 MG tablet, Take 10 mg by mouth at bedtime as needed for anxiety. , Disp: , Rfl:    Dupilumab (DUPIXENT) 300 MG/2ML SOPN, Inject 300 mg into the skin every 14 (fourteen) days., Disp: , Rfl:    Loperamide HCl (IMODIUM PO), Take 1 Dose by mouth as needed., Disp: , Rfl:    Multiple Vitamins-Minerals (WOMENS MULTI GUMMIES PO), Take by mouth., Disp: , Rfl:    Ocrelizumab (OCREVUS IV), Inject into the vein. Infusion twice per year for MS, Disp: , Rfl:    promethazine (PHENERGAN) 25 MG tablet, Take 1 tablet (25 mg total) by mouth 2 (two) times daily as needed for nausea or vomiting., Disp: 60 tablet, Rfl: 5   amphetamine-dextroamphetamine (ADDERALL XR) 15 MG 24 hr capsule, One po qAM, Disp: 30 capsule, Rfl: 0  Current Facility-Administered Medications:    dupilumab (DUPIXENT)  prefilled syringe 300 mg, 300 mg, Subcutaneous, Q14 Days, Alfonse Spruce, MD, 300 mg at 07/28/22 1459  Facility-Administered Medications Ordered in Other Visits:    gadopentetate dimeglumine (MAGNEVIST) injection 18 mL, 18 mL, Intravenous, Once PRN, Lam Mccubbins, Pearletha Furl, MD   gadopentetate dimeglumine (MAGNEVIST) injection 18 mL, 18 mL, Intravenous, Once PRN, Cope Marte, Pearletha Furl, MD  PAST MEDICAL HISTORY: Past Medical History:  Diagnosis Date   Anxiety    Heart palpitations    History of IBS    Hyperlipidemia    Hypertension    Multiple sclerosis (HCC)    Stroke [facility])     PAST SURGICAL HISTORY: Past Surgical History:  Procedure Laterality Date   ABDOMINAL HYSTERECTOMY     BLADDER NECK SUSPENSION     CHOLECYSTECTOMY     COLONOSCOPY  01/23/2011   Rehman, normal   LAPAROSCOPIC TUBAL LIGATION     TEE WITHOUT CARDIOVERSION N/A 05/05/2017   Procedure: TRANSESOPHAGEAL ECHOCARDIOGRAM (TEE);  Surgeon: Laqueta Linden, MD;  Location[facility];  Service:  Cardiovascular;  Laterality: N/A;    FAMILY HISTORY: Family History  Problem Relation Age of Onset   Asthma Mother    Hypertension Mother    Coronary artery disease Mother    COPD Mother    Diabetes Father    Hypertension Father    Colon polyps Father    Lung cancer Father    Allergic rhinitis Brother     SOCIAL HISTORY:  Social History   Socioeconomic History   Marital status: Married    Spouse name: Not on file   Number of children: Not on file   Years of education: Not on file   Highest education level: Not on file  Occupational History   Occupation: on disability  Tobacco Use   Smoking status: Former    Current packs/day: 0.00    Average packs/day: 0.5 packs/day for 15.0 years (7.5 ttl pk-yrs)    Types: Cigarettes    Start date: 09/16/1997    Quit date: 09/16/2012    Years since quitting: 10.4   Smokeless tobacco: Never  Vaping Use   Vaping status: Never Used  Substance and Sexual Activity   Alcohol use: Not Currently    Comment: occ   Drug use: No   Sexual activity: Yes    Birth control/protection: Surgical  Other Topics Concern   Not on file  Social History Narrative   Right Handed   2-3 Sodas per day   Social Determinants of Health   Financial Resource Strain: Not on file  Food Insecurity: Not on file  Transportation Needs: Not on file  Physical Activity: Inactive (06/12/2020)   Received from [facility], [facility]   Exercise Vital Sign    Days of Exercise per Week: 0 days    Minutes of Exercise per Session: 0 min  Stress: Not on file  Social Connections: Not on file  Intimate Partner Violence: Not At Risk (06/12/2020)   Received from [facility], [facility]   Humiliation, Afraid, Rape, and Kick questionnaire    Fear of Current or Ex-Partner: No    Emotionally Abused: No    Physically Abused: No    Sexually Abused: No     PHYSICAL EXAM  Vitals:   03/10/23 1139  BP: (!) 132/92  Pulse: 73  Weight: 203 lb 8 oz (92.3  kg)  Height: 5\' 6"  (1.676 m)    Body mass index is 32.85 kg/m.   General: The patient  is well-developed and well-nourished and in no acute distress.   She has tenderness in muscles of forearm and joins of hands.   Neck is non-tender.       Neurologic Exam  Mental status: She is alert and oriented x 3 at the time of the examination. The patient has apparent normal recent and remote memory, with an apparently normal attention span and concentration ability.  Speech was fine.    Cranial nerves:   Color vision is symmetric.  Color vision is mildly desaturated OD.  Extraocular movements are full.  Facial strength and sensation was normal.. No obvious hearing deficits are noted.  Motor:  Muscle bulk is normal.   Tone is normal.  Strength 5/5.  I Sensory:   She has mildly reduced touch sensation on right arm and mildly reduced vibration and touch in the right leg leg compared to left  Coordination: Cerebellar testing reveals good finger-nose-finger and left heel-to-shin.  She has reduced right heel-to-shin.  Gait and station: Station is normal.  The gait is slightly wide.  Tandem gait is wide.  Romberg is negative.  Reflexes: Deep tendon reflexes are symmetric and slightly increased in the arms, increased at the knees with crossed adductors, right >left.  She does not have ankle clonus.        DIAGNOSTIC DATA (LABS, IMAGING, TESTING) - I reviewed patient records, labs, notes, testing and imaging myself where available.  Lab Results  Component Value Date   WBC 4.8 05/02/2022   HGB 12.6 05/02/2022   HCT 38.7 05/02/2022   MCV 90 05/02/2022   PLT 263 02/21/2022      Component Value Date/Time   NA 143 05/02/2022 1516   K 4.2 05/02/2022 1516   CL 105 05/02/2022 1516   CO2 23 05/02/2022 1516   GLUCOSE 84 05/02/2022 1516   GLUCOSE 90 02/21/2022 2314   BUN 14 05/02/2022 1516   CREATININE 0.80 05/02/2022 1516   CALCIUM 9.3 05/02/2022 1516   PROT 6.7 05/02/2022 1516   ALBUMIN 4.3  05/02/2022 1516   AST 18 05/02/2022 1516   ALT 16 05/02/2022 1516   ALKPHOS 77 05/02/2022 1516   BILITOT 0.3 05/02/2022 1516   GFRNONAA >60 02/21/2022 2314   GFRAA 107 04/30/2020 1147       ASSESSMENT AND PLAN  1. Multiple sclerosis (HCC)   2. High risk medication use   3. Multiple joint pain   4. Myalgia       1.  Continue Ocrevus.  We will check labst    Check MRI around time of next visit. 2.  Continue Adderall XR , increase to 15 milligrams.  If she has trouble tolerating the higher dose to we will go back to 10 mg or consider a different stimulant  3.  Stay active and exercise as tolerated. 4.  Return to see me in 6 months or sooner if there are new or worsening neurologic symptoms.      Bryton Waight A. Epimenio Foot, MD, PhD, FAAN Certified in Neurology, Clinical Neurophysiology, Sleep Medicine, Pain Medicine and Neuroimaging Director, [facility] at [facility]  [facility] [address] [place], [place] [address] [phone]

## 2023-03-10 NOTE — Telephone Encounter (Signed)
sent to GI they obtain [employer] auth [id number] 

## 2023-03-12 LAB — RHEUMATOID FACTOR: Rheumatoid fact SerPl-aCnc: <10 [IU]/mL (ref <14.0)

## 2023-03-12 LAB — CD20 B CELLS
% CD19-B Cells: 0 % — ABNORMAL LOW (ref 4.6–22.1)
% CD20-B Cells: 0 % — ABNORMAL LOW (ref 5.0–22.3)

## 2023-03-12 LAB — IGG, IGA, IGM
IgA/Immunoglobulin A, Serum: 125 mg/dL (ref 87–352)
IgG (Immunoglobin G), Serum: 957 mg/dL (ref 586–1602)
IgM (Immunoglobulin M), Srm: 44 mg/dL (ref 26–217)

## 2023-03-12 LAB — CK: Total CK: 65 U/L (ref 32–182)

## 2023-04-15 DIAGNOSIS — Z0289 Encounter for other administrative examinations: Secondary | ICD-10-CM

## 2023-04-20 ENCOUNTER — Encounter: Payer: Self-pay | Admitting: Neurology

## 2023-04-21 ENCOUNTER — Telehealth: Payer: Self-pay | Admitting: *Deleted

## 2023-04-21 NOTE — Telephone Encounter (Signed)
Pt [employer] [place] form faxed and copy @ the front desk for p/u

## 2023-04-21 NOTE — Telephone Encounter (Signed)
Gave completed/signed [employer] form back to medical records to process for pt.

## 2023-04-23 ENCOUNTER — Ambulatory Visit
Admission: RE | Admit: 2023-04-23 | Discharge: 2023-04-23 | Disposition: A | Discharge status: Home / Self Care | Payer: Managed Care, Other (non HMO) | Source: Ambulatory Visit | Attending: Neurology | Admitting: Neurology

## 2023-04-23 DIAGNOSIS — G35 Multiple sclerosis: Secondary | ICD-10-CM

## 2023-04-23 MED ORDER — GADOPICLENOL 0.5 MMOL/ML IV SOLN
9.0000 mL | Freq: Once | INTRAVENOUS | Status: AC | PRN
  Administered 2023-04-23: 9 mL via INTRAVENOUS

## 2023-05-01 ENCOUNTER — Other Ambulatory Visit: Payer: Self-pay | Admitting: Allergy & Immunology

## 2023-05-28 ENCOUNTER — Other Ambulatory Visit (HOSPITAL_COMMUNITY): Payer: Self-pay | Admitting: Internal Medicine

## 2023-05-28 DIAGNOSIS — Z1231 Encounter for screening mammogram for malignant neoplasm of breast: Secondary | ICD-10-CM

## 2023-06-15 ENCOUNTER — Encounter (HOSPITAL_COMMUNITY): Payer: Self-pay

## 2023-06-15 ENCOUNTER — Other Ambulatory Visit: Payer: Self-pay

## 2023-06-15 ENCOUNTER — Emergency Department (HOSPITAL_COMMUNITY): Payer: Medicare Other

## 2023-06-15 ENCOUNTER — Emergency Department (HOSPITAL_COMMUNITY)
Admission: EM | Admit: 2023-06-15 | Discharge: 2023-06-16 | Disposition: A | Discharge status: Home / Self Care | Payer: Medicare Other | Attending: Emergency Medicine | Admitting: Emergency Medicine

## 2023-06-15 DIAGNOSIS — Z87891 Personal history of nicotine dependence: Secondary | ICD-10-CM | POA: Diagnosis not present

## 2023-06-15 DIAGNOSIS — M25512 Pain in left shoulder: Secondary | ICD-10-CM | POA: Diagnosis not present

## 2023-06-15 DIAGNOSIS — R200 Anesthesia of skin: Secondary | ICD-10-CM | POA: Insufficient documentation

## 2023-06-15 DIAGNOSIS — M542 Cervicalgia: Secondary | ICD-10-CM

## 2023-06-15 DIAGNOSIS — R6884 Jaw pain: Secondary | ICD-10-CM | POA: Diagnosis not present

## 2023-06-15 DIAGNOSIS — R519 Headache, unspecified: Secondary | ICD-10-CM

## 2023-06-15 DIAGNOSIS — I10 Essential (primary) hypertension: Secondary | ICD-10-CM | POA: Diagnosis not present

## 2023-06-15 LAB — COMPREHENSIVE METABOLIC PANEL
ALT: 20 U/L (ref 0–44)
AST: 27 U/L (ref 15–41)
Albumin: 4.8 g/dL (ref 3.5–5.0)
Alkaline Phosphatase: 95 U/L (ref 38–126)
Anion gap: 9 (ref 5–15)
BUN: 9 mg/dL (ref 6–20)
CO2: 23 mmol/L (ref 22–32)
Calcium: 9.5 mg/dL (ref 8.9–10.3)
Chloride: 105 mmol/L (ref 98–111)
Creatinine, Ser: 0.69 mg/dL (ref 0.44–1.00)
GFR, Estimated: >60 mL/min (ref >60)
Glucose, Bld: 95 mg/dL (ref 70–99)
Potassium: 3.3 mmol/L — ABNORMAL LOW (ref 3.5–5.1)
Sodium: 137 mmol/L (ref 135–145)
Total Bilirubin: 0.8 mg/dL (ref 0.0–1.2)
Total Protein: 8.1 g/dL (ref 6.5–8.1)

## 2023-06-15 LAB — CBC WITH DIFFERENTIAL/PLATELET
Abs Immature Granulocytes: 0.01 10*3/uL (ref 0.00–0.07)
Basophils Absolute: 0 10*3/uL (ref 0.0–0.1)
Basophils Relative: 1 %
Eosinophils Absolute: 0.1 10*3/uL (ref 0.0–0.5)
Eosinophils Relative: 1 %
HCT: 40.8 % (ref 36.0–46.0)
Hemoglobin: 14.2 g/dL (ref 12.0–15.0)
Immature Granulocytes: 0 %
Lymphocytes Relative: 29 %
Lymphs Abs: 1.7 10*3/uL (ref 0.7–4.0)
MCH: 30.9 pg (ref 26.0–34.0)
MCHC: 34.8 g/dL (ref 30.0–36.0)
MCV: 88.7 fL (ref 80.0–100.0)
Monocytes Absolute: 0.3 10*3/uL (ref 0.1–1.0)
Monocytes Relative: 5 %
Neutro Abs: 3.9 10*3/uL (ref 1.7–7.7)
Neutrophils Relative %: 64 %
Platelets: 322 10*3/uL (ref 150–400)
RBC: 4.6 MIL/uL (ref 3.87–5.11)
RDW: 12.1 % (ref 11.5–15.5)
WBC: 6 10*3/uL (ref 4.0–10.5)
nRBC: 0 % (ref 0.0–0.2)

## 2023-06-15 LAB — TROPONIN I (HIGH SENSITIVITY): Troponin I (High Sensitivity): 5 ng/L (ref <18)

## 2023-06-15 MED ORDER — IOHEXOL 350 MG/ML SOLN
75.0000 mL | Freq: Once | INTRAVENOUS | Status: AC | PRN
  Administered 2023-06-16: 75 mL via INTRAVENOUS

## 2023-06-15 MED ORDER — PROCHLORPERAZINE EDISYLATE 10 MG/2ML IJ SOLN: 10.0000 mg | Freq: Once | INTRAMUSCULAR | Status: DC

## 2023-06-15 MED ORDER — ACETAMINOPHEN 500 MG PO TABS: 1000.0000 mg | ORAL_TABLET | Freq: Once | ORAL | Status: DC

## 2023-06-15 MED ORDER — DIPHENHYDRAMINE HCL 50 MG/ML IJ SOLN: 25.0000 mg | Freq: Once | INTRAMUSCULAR | Status: DC

## 2023-06-15 NOTE — ED Provider Triage Note (Signed)
 Emergency Medicine Provider Triage Evaluation Note  Lindsey Phelps , a 43 y.o. female  was evaluated in triage.  Pt complains of chest pain.  Review of Systems  Positive: CP, SOB, facial flushing, jaw pain Negative: Vomiting, fever, cough  Physical Exam  BP 138/88   Pulse 83   Temp 99.3 F (37.4 C)   Resp 20   Wt 90.7 kg   SpO2 100%   BMI 32.28 kg/m  Gen:   Awake, no distress   Resp:  Normal effort  MSK:   Moves extremities without difficulty  Other:    Medical Decision Making  Medically screening exam initiated at 8:25 PM.  Appropriate orders placed.  Lindsey Phelps was informed that the remainder of the evaluation will be completed by another provider, this initial triage assessment does not replace that evaluation, and the importance of remaining in the ED until their evaluation is complete.  Around 6:30 tonight felt suddenly flushed, SOB, vision tunneling in, chest tightness. Symptoms persistent now. She endorses jaw pain, on the left, then right.    Odell Balls, PA-C 06/15/23 2027

## 2023-06-15 NOTE — ED Triage Notes (Signed)
 Pt states tonight at 630 she started feeling like her face was on fire and was having intermittent numbness. Pt endorses a headache and neck pain and took a baby Asprin. Pt has hx of MS. BP states BP was 145/102.

## 2023-06-15 NOTE — ED Provider Notes (Signed)
 [facility]PT [facility] Emergency Department Provider Note MRN:  [id number]  Arrival date & time: 06/16/23     Chief Complaint   Headache and Neck Pain   History of Present Illness   Lindsey Phelps is a 43 y.o. year-old female with a history of MS presenting to the ED with chief complaint of headache and neck pain.  Sudden onset of facial flushing with headache, left-sided neck and jaw pain, left shoulder pain.  Onset about 7 PM.  Persistent headache and intermittent left neck pain since that time.  Denies new numbness or weakness to the arms or legs.  Has chronic right sided numbness from a MS.  Review of Systems  A thorough review of systems was obtained and all systems are negative except as noted in the HPI and PMH.   Patient's Health History    Past Medical History:  Diagnosis Date   Anxiety    Heart palpitations    History of IBS    Hyperlipidemia    Hypertension    Multiple sclerosis (HCC)    Stroke [facility])     Past Surgical History:  Procedure Laterality Date   ABDOMINAL HYSTERECTOMY     BLADDER NECK SUSPENSION     CHOLECYSTECTOMY     COLONOSCOPY  01/23/2011   Rehman, normal   LAPAROSCOPIC TUBAL LIGATION     TEE WITHOUT CARDIOVERSION N/A 05/05/2017   Procedure: TRANSESOPHAGEAL ECHOCARDIOGRAM (TEE);  Surgeon: Charls Pearla LABOR, MD;  Location: [facility];  Service: Cardiovascular;  Laterality: N/A;    Family History  Problem Relation Age of Onset   Asthma Mother    Hypertension Mother    Coronary artery disease Mother    COPD Mother    Diabetes Father    Hypertension Father    Colon polyps Father    Lung cancer Father    Allergic rhinitis Brother     Social History   Socioeconomic History   Marital status: Married    Spouse name: Not on file   Number of children: Not on file   Years of education: Not on file   Highest education level: Not on file  Occupational History   Occupation: on disability  Tobacco Use   Smoking status:  Former    Current packs/day: 0.00    Average packs/day: 0.5 packs/day for 15.0 years (7.5 ttl pk-yrs)    Types: Cigarettes    Start date: 09/16/1997    Quit date: 09/16/2012    Years since quitting: 10.7   Smokeless tobacco: Never  Vaping Use   Vaping status: Never Used  Substance and Sexual Activity   Alcohol use: Not Currently    Comment: occ   Drug use: No   Sexual activity: Yes    Birth control/protection: Surgical  Other Topics Concern   Not on file  Social History Narrative   Right Handed   2-3 Sodas per day   Social Drivers of Health   Financial Resource Strain: Not on file  Food Insecurity: Not on file  Transportation Needs: Not on file  Physical Activity: Inactive (06/12/2020)   Received from [facility], [facility] Care   Exercise Vital Sign    Days of Exercise per Week: 0 days    Minutes of Exercise per Session: 0 min  Stress: Not on file  Social Connections: Not on file  Intimate Partner Violence: Not At Risk (06/12/2020)   Received from [facility], [facility]   Humiliation, Afraid, Rape, and [place]  questionnaire    Fear of Current or Ex-Partner: No    Emotionally Abused: No    Physically Abused: No    Sexually Abused: No     Physical Exam   Vitals:   06/15/23 2013 06/15/23 2330  BP: 138/88 (!) 125/93  Pulse: 83 67  Resp: 20   Temp: 99.3 F (37.4 C)   SpO2: 100% 99%    CONSTITUTIONAL: Well-appearing, NAD NEURO/PSYCH:  Alert and oriented x 3, no focal deficits EYES:  eyes equal and reactive ENT/NECK:  no LAD, no JVD CARDIO: Regular rate, well-perfused, normal S1 and S2 PULM:  CTAB no wheezing or rhonchi GI/GU:  non-distended, non-tender MSK/SPINE:  No gross deformities, no edema SKIN:  no rash, atraumatic   *Additional and/or pertinent findings included in MDM below  Diagnostic and Interventional Summary    EKG Interpretation Date/Time:  Monday June 15 2023 21:02:49 EST Ventricular Rate:  80 PR Interval:  110 QRS  Duration:  76 QT Interval:  350 QTC Calculation: 403 R Axis:   25  Text Interpretation: Sinus rhythm with short PR Otherwise normal ECG When compared with ECG of 22-Feb-2022 00:31, PREVIOUS ECG IS PRESENT Confirmed by Theadore Sharper [phone]) on 06/15/2023 11:03:26 PM       Labs Reviewed  COMPREHENSIVE METABOLIC PANEL - Abnormal; Notable for the following components:      Result Value   Potassium 3.3 (*)    All other components within normal limits  CBC WITH DIFFERENTIAL/PLATELET  TROPONIN I (HIGH SENSITIVITY)  TROPONIN I (HIGH SENSITIVITY)    CT ANGIO HEAD NECK W WO CM  Final Result    DG Chest 2 View  Final Result      Medications  acetaminophen  (TYLENOL ) tablet 1,000 mg (has no administration in time range)  iohexol  (OMNIPAQUE ) 350 MG/ML injection 75 mL (75 mLs Intravenous Contrast Given 06/16/23 0010)     Procedures  /  Critical Care Procedures  ED Course and Medical Decision Making  Initial Impression and Ddx Differential diagnosis includes subarachnoid hemorrhage, carotid artery dissection, ACS, pneumothorax, MSK, component of anxiety is possible as well.  No new neurological deficits to suggest MS flare, baseline neurological exam would also make stroke unlikely at this time.  Past medical/surgical history that increases complexity of ED encounter: MS  Interpretation of Diagnostics I personally reviewed the EKG and my interpretation is as follows: Sinus rhythm without concerning ischemic findings  Labs reassuring with no significant blood count or electrolyte disturbance, troponin negative x 2.  Patient Reassessment and Ultimate Disposition/Management     CTA imaging is without evidence of dissection or intracranial bleeding.  On reassessment patient is feeling a lot better, continued baseline neurological exam, patient wants to go home and get some sleep.  Advised to follow-up with her regular doctors, appropriate for discharge.  Patient management required  discussion with the following services or consulting groups:  None  Complexity of Problems Addressed Acute illness or injury that poses threat of life of bodily function  Additional Data Reviewed and Analyzed Further history obtained from: Further history from spouse/family member  Additional Factors Impacting ED Encounter Risk None  Sharper HERO. Theadore, MD [facility] Emergency Medicine [facility] [email or web address]  Final Clinical Impressions(s) / ED Diagnoses     ICD-10-CM   1. Neck pain  M54.2     2. Bad headache  R51.9       ED Discharge Orders     None        Discharge  Instructions Discussed with and Provided to Patient:     Discharge Instructions      You were evaluated in the Emergency Department and after careful evaluation, we did not find any emergent condition requiring admission or further testing in the hospital.  Your exam/testing today is overall reassuring.  Recommend follow-up with your regular doctors to discuss your symptoms.  Please return to the Emergency Department if you experience any worsening of your condition.   Thank you for allowing us  to be a part of your care.       Theadore Ozell HERO, MD 06/16/23 0130

## 2023-06-16 LAB — TROPONIN I (HIGH SENSITIVITY): Troponin I (High Sensitivity): 6 ng/L (ref <18)

## 2023-06-16 NOTE — Discharge Instructions (Signed)
You were evaluated in the Emergency Department and after careful evaluation, we did not find any emergent condition requiring admission or further testing in the hospital. ° °Your exam/testing today is overall reassuring.  Recommend follow-up with your regular doctors to discuss your symptoms. ° °Please return to the Emergency Department if you experience any worsening of your condition.   Thank you for allowing us to be a part of your care. °

## 2023-06-17 ENCOUNTER — Telehealth: Payer: Self-pay | Admitting: Neurology

## 2023-06-17 ENCOUNTER — Telehealth: Payer: Self-pay | Admitting: Cardiology

## 2023-06-17 NOTE — Telephone Encounter (Signed)
 Pt was recently in the ER and wants to discuss this.

## 2023-06-17 NOTE — Telephone Encounter (Signed)
 Spoke with pt who states that Monday she was at a car dealership buy a car and started to feel flush, neck and arm pain and headache. Pt states that she felt as if she was going to black out. Pt then went home and checked her BP that was noted to be 145/102. She was seen in the ER and CT of the head and neck was done. Pt is requesting that Dr. Londa Rival review ER visit and testing. Pt was seen a year ago and was supposed to have stress test done but her insurance did not approved the test. Pt is asking what her next step should be. She has an Appt with PCP on next Thursday. Please advise.

## 2023-06-17 NOTE — Telephone Encounter (Signed)
Attempted to contact patient no answer no voicemail

## 2023-06-17 NOTE — Telephone Encounter (Signed)
 Pt states she went to the ER Monday due to having pain on left side of neck, dizziness, feeling like going to pass out. She received the CT Scan of the brain results on MyChart and would like to discuss them with a nurse. States she still doesn't feel like herself. Requesting call back

## 2023-06-18 ENCOUNTER — Encounter: Payer: Self-pay | Admitting: Neurology

## 2023-06-18 NOTE — Telephone Encounter (Signed)
Pt notified and voiced understanding and was thankful for the call.

## 2023-06-18 NOTE — Telephone Encounter (Signed)
Call to patient who reports going to ER on Monday due to pain on left side of her neck, facial numbness, and dizziness. She reports that her vision started to go dark but she didn't pass out. She felt like she couldn't catch her breath. She reports medication compliance and all symptoms have resolved except for headaches which she has used aleve for with some relief and the facial flushing. She states the ER discharged her and all tests/lab work came back fine and then she saw her CT results and is worried about the "Severe stenosis of the distal right PCA P2 segment ". Advised I would send to Dr. Epimenio Foot for review and advice and our office wouls reach back out.  She is also scared of any heart related issues or blockages. She is aware to discuss with PCP as there is a family history of heart attacks.

## 2023-07-06 ENCOUNTER — Other Ambulatory Visit (HOSPITAL_COMMUNITY): Payer: Self-pay | Admitting: Interventional Radiology

## 2023-07-06 DIAGNOSIS — I771 Stricture of artery: Secondary | ICD-10-CM

## 2023-07-14 ENCOUNTER — Telehealth: Payer: Self-pay | Admitting: Neurology

## 2023-07-14 ENCOUNTER — Ambulatory Visit (HOSPITAL_COMMUNITY)
Admission: RE | Admit: 2023-07-14 | Discharge: 2023-07-14 | Disposition: A | Discharge status: Home / Self Care | Payer: Managed Care, Other (non HMO) | Source: Ambulatory Visit | Attending: Interventional Radiology | Admitting: Interventional Radiology

## 2023-07-14 DIAGNOSIS — I771 Stricture of artery: Secondary | ICD-10-CM

## 2023-07-14 NOTE — Telephone Encounter (Signed)
Pt came into office. Had a meeting with Dr. Corliss Skains today about ED visit on 06/15/2023 and testing that was done. He stated he believed pt has a severe stenosis and agreed with radiology. Told pt he wants her to get Cerebral angiography. Wanted pt to schedule this now or wait 6 months and have another CT scan. Pt would like to discuss with Dr. Epimenio Foot what the best way to proceed would be. Would like Dr. Epimenio Foot to look at the blood work she had done at primary care as well. Pt would like to get an appt schedule with Dr. Epimenio Foot to discuss all of these things if possible to get worked in sooner than April 2025. Would like a call to discuss. Pt is still having ongoing headaches, facial numbness and neck pain.

## 2023-07-14 NOTE — Telephone Encounter (Signed)
Dr. Epimenio Foot- do you agree to see pt to discuss? Anywhere you are ok with working her in sooner for appt?

## 2023-07-16 ENCOUNTER — Telehealth (HOSPITAL_COMMUNITY): Payer: Self-pay

## 2023-07-16 ENCOUNTER — Other Ambulatory Visit (HOSPITAL_COMMUNITY): Payer: Self-pay | Admitting: Interventional Radiology

## 2023-07-16 DIAGNOSIS — I771 Stricture of artery: Secondary | ICD-10-CM

## 2023-07-16 HISTORY — PX: IR RADIOLOGIST EVAL & MGMT: IMG5224

## 2023-07-16 NOTE — Telephone Encounter (Signed)
Pt would like to speak with her PCP and then her MS physician on the first week of March. After that, she will decide if she wants to move forward with the angiogram. AB

## 2023-08-06 ENCOUNTER — Encounter: Payer: Self-pay | Admitting: Neurology

## 2023-08-06 ENCOUNTER — Ambulatory Visit: Payer: Managed Care, Other (non HMO) | Admitting: Neurology

## 2023-08-06 VITALS — BP 145/105 | HR 88 | Ht 66.0 in | Wt 192.5 lb

## 2023-08-06 DIAGNOSIS — Z79899 Other long term (current) drug therapy: Secondary | ICD-10-CM | POA: Diagnosis not present

## 2023-08-06 DIAGNOSIS — G4489 Other headache syndrome: Secondary | ICD-10-CM

## 2023-08-06 DIAGNOSIS — R232 Flushing: Secondary | ICD-10-CM

## 2023-08-06 DIAGNOSIS — G35 Multiple sclerosis: Secondary | ICD-10-CM

## 2023-08-06 DIAGNOSIS — R21 Rash and other nonspecific skin eruption: Secondary | ICD-10-CM | POA: Diagnosis not present

## 2023-08-06 DIAGNOSIS — M2550 Pain in unspecified joint: Secondary | ICD-10-CM

## 2023-08-06 DIAGNOSIS — L2089 Other atopic dermatitis: Secondary | ICD-10-CM

## 2023-08-06 DIAGNOSIS — I679 Cerebrovascular disease, unspecified: Secondary | ICD-10-CM

## 2023-08-06 MED ORDER — AMPHETAMINE-DEXTROAMPHET ER 10 MG PO CP24: 10.0000 mg | ORAL_CAPSULE | Freq: Every day | ORAL | 0 refills | Status: DC

## 2023-08-06 MED ORDER — PROMETHAZINE HCL 25 MG PO TABS: 25.0000 mg | ORAL_TABLET | Freq: Two times a day (BID) | ORAL | 5 refills | Status: AC | PRN

## 2023-08-06 NOTE — Progress Notes (Signed)
 [facility]  PATIENT: Lindsey Phelps DOB: 02-29-1981  REFERRING DOCTOR OR PCP:  Elfredia Nevins SOURCE: Patient, notes from Dr. Sherwood Gambler, laboratory results.  _________________________________   HISTORICAL  CHIEF COMPLAINT:  Chief Complaint  Patient presents with   Multiple Sclerosis    Rm10, alone, ms: face flushed (RED), vision deficit during an episode at a car dealership, overheated, felt like she was going to faint, neck pain, face/mouth/jaw numbness, switched from adderral 15mg  to 10mg  on her own and would like a refill    HISTORY OF PRESENT ILLNESS:  Lindsey Phelps is a 43 y.o. woman with relapsing remiting MS with multiple tumefactive lesions.  Update 08/06/2023: She had her last infusion in December 11.  She often has flushing after her infusions and this occurred as expected.  However, the flushing is persisting.    While at a car dealership, she had more flushing and also noted a tunnel vision and had the onset of numbness in her upper chest/face and some jaw pain.   She did not pass out.  The dealer placed some snow on her neck as she felt so hot.    A neighbor drove her home   BP ws 142/102.  BP has done better recently.     She had no medication changes in December but has since added aspirin and fish oil.     She had scans including CT Angiogram which showed severe stenosis of the right PCA P2.   This looks more mild to moderate to me.    I advised her to take ASA daily.    She has had HA.  In the past a TPI helped about 2 weeks.  She ha had some infusion reaction on Ocrevus, none severe but some flushing and myalgias..     Flexeril has not helped.   She cannot tolerate gabapentin.   She had tried an old oxycodone and it helped.    She started Dupixent (for her skin rash felt to be due to [facility]) in March 2024 (pain preceded it).   She tried duloxetine for her myalgias but she felt strange and stopped.  Gabapentin made her sleepy.       Gait is  mildly off-balanced.  She has residual mild right leg weakness affecting gait at times since the 2019 episode    She has tingling and dysesthesias in the legs, right greater than left.  She has more pain and tingling numbness in the hands.    NCV/EMG 07/19/2019 did not show any neuropathy or evidence of radiculopathy. She has some urinary urgency but no incontinence.   She has word finding difficulties at times and reduced STM/focus.    Valium at night helps her sleep.   She still sometimes wakes up.   She has some anxiety and agoraphobia.     She has more fatigue than earlier this year.   Adderall has helped with cognitive issues and fatigue a bit.  Dose was reduced to 10 mg last year due to s.e.   She is tolerating 10 mg very well and we discussed a small increase   .    MS History: In 03/31/2017 she experienced word finding difficulties and an MRI of the brain was performed concerning for a subacute left frontal stroke. There were a few other punctate T2/FLAIR hyperintense foci consistent with minimal chronic microvascular ischemic change.  Stroke work-up was performed.  I was most concerned about an embolic etiology. The carotid ultrasound was essentially normal.  The cardiac event monitor was essentially normal.  A TEE showed a normal ejection fraction of 60-65 percent.   There was no evidence of a patent foramen ovale or other right-to-left shunt. The study was felt to be normal.  She is on aspirin, metoprolol and Crestor.    A subsequent MRI 07/06/2017 showed multiple areas of juxtacortical edema and enhancement, most consistent with demyelination.  A biopsy was considered but due to his improvement on the subsequent MRI, more consistent with demyelination then tumor, this was held.  She was started on Tysabri 07/2017.  MRI 11/10/2017 was stable.  Due to tolerability issues, she stopped May 2020 and started on [name].  Switched to [employer] in 2024.    Imaging: MRI of the brain 04/07/2017 showed a  partially enhancing focus in the left frontal lobe.  There were a couple punctate T2/FLAIR hyperintense foci elsewhere that were not nonspecific.  MRI of the brain 07/06/2017 showed multiple juxtacortical and periventricular enhancing lesions.  MRI of the brain 07/28/2017 showed improvement of several lesions noted on the previous MRI though one of the foci was larger.  MRI of the brain 11/10/2017 showed no new lesions and resolution of the enhancement seen on the previous MRI  MRI of the brain 03/03/2018 showed no new lesions  MRI of the brain 11/03/2018 showed multiple juxtacortical and periventricular white matter foci.  None of these were new.  They do not enhance.  MRI of the cervical and thoracic spine 11/03/2018 showed a normal spinal cord.  MRI of the brain 05/10/2019 showed no new lesions.  MRI of the brain 05/09/2020 showed multiple T2/FLAIR hyperintense foci, predominantly in the juxtacortical white matter.  None of the foci enhances or appears to be acute.  Compared to the MRI dated 05/10/2019, there are no new lesions.   MRi of the brain 05/22/2021 was stable.    REVIEW OF SYSTEMS: Constitutional: No fevers, chills, sweats, or change in appetite.   She has mild sleep onset insomnia Eyes: No visual changes, double vision, eye pain Ear, nose and throat: No hearing loss, ear pain, nasal congestion, sore throat Cardiovascular: No chest pain.  Has intermittent tachycardia.  Respiratory:  No shortness of breath at rest or with exertion.   No wheezes GastrointestinaI: No nausea, vomiting, diarrhea, abdominal pain, fecal incontinence Genitourinary:  No dysuria, urinary retention or frequency.  No nocturia. Musculoskeletal: Neck pain as above.   Integumentary: No rash, pruritus, skin lesions Neurological: as above Psychiatric: No depression at this time.  H/o panic attacks. Endocrine: No palpitations, diaphoresis, change in appetite, change in weigh or increased  thirst Hematologic/Lymphatic:  No anemia, purpura, petechiae. Allergic/Immunologic: has some seasonal allergies  ALLERGIES: Allergies  Allergen Reactions   Darvocet [Propoxyphene N-Acetaminophen] Hives   Demerol Hives   Doxycycline Other (See Comments)    BLISTERS AND HIVES   Adhesive [Tape] Rash and Other (See Comments)    Including bandaids - takes skin off, peels welts    HOME MEDICATIONS:  Current Outpatient Medications:    Acetaminophen (TYLENOL PO), Take by mouth as needed., Disp: , Rfl:    Cetirizine HCl (ZYRTEC ALLERGY) 10 MG CAPS, Take 10 mg by mouth daily., Disp: , Rfl:    Cholecalciferol (D3 ADULT PO), Take by mouth., Disp: , Rfl:    cyclobenzaprine (FLEXERIL) 5 MG tablet, Take up to 3 pills daily as needed, Disp: 90 tablet, Rfl: 5   diazepam (VALIUM) 10 MG tablet, Take 10 mg by mouth at bedtime as needed for anxiety. ,  Disp: , Rfl:    DUPIXENT 300 MG/2ML prefilled syringe, INJECT 300 MG (1 SYRINGE) UNDER THE SKIN EVERY 2 WEEKS, Disp: 12 mL, Rfl: 3   Loperamide HCl (IMODIUM PO), Take 1 Dose by mouth as needed., Disp: , Rfl:    Ocrelizumab (OCREVUS IV), Inject into the vein. Infusion twice per year for MS, Disp: , Rfl:    amphetamine-dextroamphetamine (ADDERALL XR) 10 MG 24 hr capsule, Take 1 capsule (10 mg total) by mouth daily., Disp: 30 capsule, Rfl: 0   promethazine (PHENERGAN) 25 MG tablet, Take 1 tablet (25 mg total) by mouth 2 (two) times daily as needed for nausea or vomiting., Disp: 60 tablet, Rfl: 5  Current Facility-Administered Medications:    dupilumab (DUPIXENT) prefilled syringe 300 mg, 300 mg, Subcutaneous, Q14 Days, Alfonse Spruce, MD, 300 mg at 07/28/22 1459  Facility-Administered Medications Ordered in Other Visits:    gadopentetate dimeglumine (MAGNEVIST) injection 18 mL, 18 mL, Intravenous, Once PRN, Tajuan Dufault, Pearletha Furl, MD   gadopentetate dimeglumine (MAGNEVIST) injection 18 mL, 18 mL, Intravenous, Once PRN, Sophya Vanblarcom, Pearletha Furl, MD  PAST MEDICAL  HISTORY: Past Medical History:  Diagnosis Date   Anxiety    Heart palpitations    History of IBS    Hyperlipidemia    Hypertension    Multiple sclerosis (HCC)    Stroke [facility])     PAST SURGICAL HISTORY: Past Surgical History:  Procedure Laterality Date   ABDOMINAL HYSTERECTOMY     BLADDER NECK SUSPENSION     CHOLECYSTECTOMY     COLONOSCOPY  01/23/2011   Rehman, normal   IR RADIOLOGIST EVAL & MGMT  07/16/2023   LAPAROSCOPIC TUBAL LIGATION     TEE WITHOUT CARDIOVERSION N/A 05/05/2017   Procedure: TRANSESOPHAGEAL ECHOCARDIOGRAM (TEE);  Surgeon: Laqueta Linden, MD;  Location: [facility];  Service: Cardiovascular;  Laterality: N/A;    FAMILY HISTORY: Family History  Problem Relation Age of Onset   Asthma Mother    Hypertension Mother    Coronary artery disease Mother    COPD Mother    Diabetes Father    Hypertension Father    Colon polyps Father    Lung cancer Father    Allergic rhinitis Brother     SOCIAL HISTORY:  Social History   Socioeconomic History   Marital status: Married    Spouse name: Not on file   Number of children: Not on file   Years of education: Not on file   Highest education level: Not on file  Occupational History   Occupation: on disability  Tobacco Use   Smoking status: Former    Current packs/day: 0.00    Average packs/day: 0.5 packs/day for 15.0 years (7.5 ttl pk-yrs)    Types: Cigarettes    Start date: 09/16/1997    Quit date: 09/16/2012    Years since quitting: 10.8   Smokeless tobacco: Never  Vaping Use   Vaping status: Never Used  Substance and Sexual Activity   Alcohol use: Not Currently    Comment: occ   Drug use: No   Sexual activity: Yes    Birth control/protection: Surgical  Other Topics Concern   Not on file  Social History Narrative   Right Handed   2-3 Sodas per day   Social Drivers of Health   Financial Resource Strain: Not on file  Food Insecurity: Not on file  Transportation Needs: Not on file   Physical Activity: Inactive (06/12/2020)   [facility], [facility]  Exercise Vital Sign    Days of Exercise per Week: 0 days    Minutes of Exercise per Session: 0 min  Stress: Not on file  Social Connections: Not on file  Intimate Partner Violence: Not At Risk (06/12/2020)   Received from [facility], [facility]   Humiliation, Afraid, Rape, and Kick questionnaire    Fear of Current or Ex-Partner: No    Emotionally Abused: No    Physically Abused: No    Sexually Abused: No     PHYSICAL EXAM  Vitals:   08/06/23 1305  BP: (!) 145/105  Pulse: 88  Weight: 192 lb 8 oz (87.3 kg)  Height: 5\' 6"  (1.676 m)    Body mass index is 31.07 kg/m.   General: The patient is well-developed and well-nourished and in no acute distress.   She has mild tenderness over the occiput      Neurologic Exam  Mental status: She is alert and oriented x 3 at the time of the examination. The patient has apparent normal recent and remote memory, with an apparently normal attention span and concentration ability.  Speech was fine.    Cranial nerves:   Color vision is symmetric.  Color vision is mildly desaturated OD.  Extraocular movements are full.  Facial strength and sensation was normal.. No obvious hearing deficits are noted.  Motor:  Muscle bulk is normal.   Tone is normal.  Strength 5/5.  I Sensory:   She has mildly reduced touch sensation on right arm and mildly reduced vibration and touch in the right leg leg compared to left.    Coordination: Cerebellar testing reveals good finger-nose-finger and left heel-to-shin.  She has reduced right heel-to-shin.  Gait and station: Station is normal.  Her gait is mildly wide.  The tandem gait is wide.  Romberg is negative.  Reflexes: Deep tendon reflexes are symmetric and slightly increased in the arms, increased at the knees with crossed adductors, right >left.  She does not have ankle clonus.        DIAGNOSTIC DATA  (LABS, IMAGING, TESTING) - I reviewed patient records, labs, notes, testing and imaging myself where available.  Lab Results  Component Value Date   WBC 6.0 06/15/2023   HGB 14.2 06/15/2023   HCT 40.8 06/15/2023   MCV 88.7 06/15/2023   PLT 322 06/15/2023      Component Value Date/Time   NA 137 06/15/2023 2058   NA 143 05/02/2022 1516   K 3.3 (L) 06/15/2023 2058   CL 105 06/15/2023 2058   CO2 23 06/15/2023 2058   GLUCOSE 95 06/15/2023 2058   BUN 9 06/15/2023 2058   BUN 14 05/02/2022 1516   CREATININE 0.69 06/15/2023 2058   CALCIUM 9.5 06/15/2023 2058   PROT 8.1 06/15/2023 2058   PROT 6.7 05/02/2022 1516   ALBUMIN 4.8 06/15/2023 2058   ALBUMIN 4.3 05/02/2022 1516   AST 27 06/15/2023 2058   ALT 20 06/15/2023 2058   ALKPHOS 95 06/15/2023 2058   BILITOT 0.8 06/15/2023 2058   BILITOT 0.3 05/02/2022 1516   GFRNONAA >60 06/15/2023 2058   GFRAA 107 04/30/2020 1147       ASSESSMENT AND PLAN  1. Multiple sclerosis (HCC)   2. Facial rash   3. Flushing   4. High risk medication use   5. Multiple joint pain   6. Other headache syndrome   7. Flexural atopic dermatitis   8. Intracranial vascular stenosis      1.  Continue Ocrevus.  We will check IgG/IgM and CBC/differential.  Additionally, due to her facial flushing/rash we will check some vasculitis labs. 2.  For P2 stenosis, recheck CTA over summer and refer for intervention if stenosis appears similar or wore.    She is reluctant to proceed with conventional angiogram at this time but will reconsider based on next CTA.  She will take aspirin.  Blood pressure has been mildly elevated at times but generally in the normal or near normal range. 3.  Continue Adderall XR , decrease back to 10 milligrams.  3.  Stay active and exercise as tolerated. 4.  If another altered consciousness spell will check EEG and cardiac monitor Return to see me in 6 months or sooner if there are new or worsening neurologic symptoms.      Curlie Macken  A. Epimenio Foot, MD, PhD, FAAN Certified in Neurology, Clinical Neurophysiology, Sleep Medicine, Pain Medicine and Neuroimaging Director, [facility]  [facility] [address], [address] [place], [place] [address] [phone]

## 2023-08-08 ENCOUNTER — Encounter: Payer: Self-pay | Admitting: Neurology

## 2023-08-13 LAB — ANA+ENA+DNA/DS+SCL 70+SJOSSA/B
ANA Titer 1: NEGATIVE
ENA RNP Ab: 0.4 AI (ref 0.0–0.9)
ENA SM Ab Ser-aCnc: <0.2 AI (ref 0.0–0.9)
ENA SSA (RO) Ab: <0.2 AI (ref 0.0–0.9)
ENA SSB (LA) Ab: <0.2 AI (ref 0.0–0.9)
Scleroderma (Scl-70) (ENA) Antibody, IgG: <0.2 AI (ref 0.0–0.9)
dsDNA Ab: <1 [IU]/mL (ref 0–9)

## 2023-08-13 LAB — IGG, IGA, IGM
IgG (Immunoglobin G), Serum: 894 mg/dL (ref 586–1602)
IgM (Immunoglobulin M), Srm: 35 mg/dL (ref 26–217)
Immunoglobulin A, (IgA) QN, Serum: 103 mg/dL (ref 87–352)

## 2023-08-13 LAB — C-REACTIVE PROTEIN: CRP: <1 mg/L (ref 0–10)

## 2023-08-13 LAB — SEDIMENTATION RATE: Sed Rate: 3 mm/h (ref 0–32)

## 2023-09-22 ENCOUNTER — Ambulatory Visit: Payer: Managed Care, Other (non HMO) | Admitting: Neurology

## 2023-09-24 ENCOUNTER — Other Ambulatory Visit: Payer: Self-pay | Admitting: Neurology

## 2023-09-24 NOTE — Telephone Encounter (Signed)
 Last seen on 08/06/23 Follow up scheduled on 03/09/24   Dispensed Days Supply Quantity Provider Pharmacy  dextroamphetamine-amphetamine  ER 10 mg 24hr capsule,extend release 08/06/2023 30 30 each Sater, Sherida Dimmer, MD [facility] [place], .      Rx pending to be signed

## 2023-10-05 ENCOUNTER — Ambulatory Visit (INDEPENDENT_AMBULATORY_CARE_PROVIDER_SITE_OTHER): Admitting: Allergy & Immunology

## 2023-10-05 ENCOUNTER — Encounter: Payer: Self-pay | Admitting: Allergy & Immunology

## 2023-10-05 ENCOUNTER — Other Ambulatory Visit: Payer: Self-pay

## 2023-10-05 VITALS — BP 130/74 | HR 71 | Temp 98.3°F | Resp 17 | Ht 66.54 in | Wt 192.6 lb

## 2023-10-05 DIAGNOSIS — L2089 Other atopic dermatitis: Secondary | ICD-10-CM | POA: Diagnosis not present

## 2023-10-05 DIAGNOSIS — J302 Other seasonal allergic rhinitis: Secondary | ICD-10-CM | POA: Diagnosis not present

## 2023-10-05 DIAGNOSIS — J3089 Other allergic rhinitis: Secondary | ICD-10-CM

## 2023-10-05 DIAGNOSIS — G35 Multiple sclerosis: Secondary | ICD-10-CM | POA: Diagnosis not present

## 2023-10-05 NOTE — Progress Notes (Unsigned)
 FOLLOW UP  Date of Service/Encounter:  10/05/23   Assessment:   Flexural atopic dermatitis - followed by Dr. Ivonne Marry (DOING great on Dupixent )   Allergic dermatitis due to other chemical product (nickel and balsam of [place])   Multiple sclerosis - doing well on Ocrevus  Plan/Recommendations:   There are no Patient Instructions on file for this visit.   Subjective:   MIRELLA MILMAN is a 43 y.o. female presenting today for follow up of No chief complaint on file.   AHMIRA TRILLO has a history of the following: Patient Active Problem List   Diagnosis Date Noted   Chronic rhinitis 06/18/2022   Dermatitis venenata 06/18/2022   Flexural atopic dermatitis 06/18/2022   Blistering rash 02/20/2022   Nausea without vomiting 04/09/2021   Irritable bowel syndrome with diarrhea 04/09/2021   Nausea 10/31/2020   High risk medication use 10/24/2019   Cognitive deficit secondary to MS [facility]) 12/14/2018   Other fatigue 12/14/2018   Other headache syndrome 10/22/2018   Neck pain 03/08/2018   Urinary dysfunction 11/20/2017   Multiple sclerosis (HCC) 07/06/2017   Speech disturbance 06/11/2017   Stroke (HCC) 04/07/2017   Numbness 03/31/2017   Vertigo 03/31/2017   Slurred speech 03/31/2017   Vision disturbance 03/31/2017   Anxiety 03/31/2017   Gait disturbance 03/31/2017   Leg swelling 12/30/2013   Palpitations 09/16/2013   Chest pain 09/16/2013    History obtained from: chart review and {Persons; PED relatives w/patient:19415::"patient"}.  Discussed the use of AI scribe software for clinical note transcription with the patient and/or guardian, who gave verbal consent to proceed.  Zamoni is a 43 y.o. female presenting for a follow up visit.  She was last seen in April 2024.  At that time, we continued with cetirizine and nasal saline rinses for her perennial and seasonal allergic rhinitis.  For her atopic dermatitis, we continued with twice a day moisturizing as well as  Elidel  and triamcinolone.  She also was doing well on Dupixent  every 2 weeks.  For her contact dermatitis, she continue to avoid nickel as well as balsam of [place].  Since the last visit,   Asthma/Respiratory Symptom History: ***  Allergic Rhinitis Symptom History: ***  Food Allergy Symptom History: ***  Skin Symptom History: ***  GERD Symptom History: ***  Infection Symptom History: ***  Otherwise, there have been no changes to her past medical history, surgical history, family history, or social history.    Review of systems otherwise negative other than that mentioned in the HPI.    Objective:   There were no vitals taken for this visit. There is no height or weight on file to calculate BMI.    Physical Exam   Diagnostic studies: {Blank single:19197::"none","deferred due to recent antihistamine use","deferred due to insurance stipulations that require a separate visit for testing","labs sent instead"," "}  Spirometry: {Blank single:19197::"results normal (FEV1: ***%, FVC: ***%, FEV1/FVC: ***%)","results abnormal (FEV1: ***%, FVC: ***%, FEV1/FVC: ***%)"}.    {Blank single:19197::"Spirometry consistent with mild obstructive disease","Spirometry consistent with moderate obstructive disease","Spirometry consistent with severe obstructive disease","Spirometry consistent with possible restrictive disease","Spirometry consistent with mixed obstructive and restrictive disease","Spirometry uninterpretable due to technique","Spirometry consistent with normal pattern"}. {Blank single:19197::"Albuterol/Atrovent nebulizer","Xopenex/Atrovent nebulizer","Albuterol nebulizer","Albuterol four puffs via MDI","Xopenex four puffs via MDI"} treatment given in clinic with {Blank single:19197::"significant improvement in FEV1 per ATS criteria","significant improvement in FVC per ATS criteria","significant improvement in FEV1 and FVC per ATS criteria","improvement in FEV1, but not significant per ATS  criteria","improvement in FVC, but not  significant per ATS criteria","improvement in FEV1 and FVC, but not significant per ATS criteria","no improvement"}.  Allergy Studies: {Blank single:19197::"none","deferred due to recent antihistamine use","deferred due to insurance stipulations that require a separate visit for testing","labs sent instead"," "}    {Blank single:19197::"Allergy testing results were read and interpreted by myself, documented by clinical staff."," "}      Drexel Gentles, MD  [facility] 

## 2023-10-05 NOTE — Patient Instructions (Addendum)
 Allergic rhinitis - improved with the Dupixent  - Continue allergen avoidance measures directed toward cat, dog, mold, grass pollen, tree pollen, weed pollen, and ragweed pollen. - Continue cetirizine 10 mg once a day as needed for a runny nose - Consider saline nasal rinses as needed for nasal symptoms.   2. Atopic dermatitis - doing VERY well on the Dupixent   - Continue a twice a day moisturizing routine - Continue Elidel  to red, itchy areas twice a day as needed - Continue triamcinolone to stubborn, red itchy areas below your face twice a day as needed. Do not use this medication longer than 2 weeks in a row - Continue with Dupixent  every two weeks since this seems to be going well.   3. Contact allergic dermatitis - Continue to avoid nickel and balsam of [place]  4. Return in about 1 year (around 10/04/2024). You can have the follow up appointment with Dr. Idolina Maker or a Nurse Practicioner (our Nurse Practitioners are excellent and always have Physician oversight!).    Please inform us  of any Emergency Department visits, hospitalizations, or changes in symptoms. Call us  before going to the ED for breathing or allergy symptoms since we might be able to fit you in for a sick visit. Feel free to contact us  anytime with any questions, problems, or concerns.  It was a pleasure to see you again today!  Websites that have reliable patient information: 1. American Academy of Asthma, Allergy, and Immunology: www.aaaai.org 2. Food Allergy Research and Education (FARE): foodallergy.org 3. Mothers of Asthmatics: http://www.asthmacommunitynetwork.org 4. American College of Allergy, Asthma, and Immunology: www.acaai.org      "Like" us  on Facebook and Instagram for our latest updates!      A healthy democracy works best when [employer] participate! Make sure you are registered to vote! If you have moved or changed any of your contact information, you will need to get this updated before voting!  Scan the QR codes below to learn more!

## 2023-10-27 ENCOUNTER — Other Ambulatory Visit: Payer: Self-pay | Admitting: Neurology

## 2023-10-27 NOTE — Telephone Encounter (Signed)
 Last seen 08/06/23 and next f/u 03/09/24. Last refilled 09/24/23 #30.

## 2023-11-27 ENCOUNTER — Encounter (HOSPITAL_COMMUNITY): Payer: Self-pay | Admitting: Interventional Radiology

## 2023-12-01 ENCOUNTER — Encounter: Payer: Self-pay | Admitting: Neurology

## 2023-12-01 ENCOUNTER — Other Ambulatory Visit: Payer: Self-pay | Admitting: Neurology

## 2023-12-01 NOTE — Telephone Encounter (Signed)
 Pt Last Seen 08/06/2023 Upcoming Appointment 03/09/2024  Adderall 10/27/2023 Escript 12/01/2023

## 2023-12-07 ENCOUNTER — Ambulatory Visit: Payer: Self-pay | Admitting: Neurology

## 2023-12-07 ENCOUNTER — Ambulatory Visit
Admission: RE | Admit: 2023-12-07 | Discharge: 2023-12-07 | Disposition: A | Discharge status: Home / Self Care | Source: Ambulatory Visit | Attending: Neurology | Admitting: Neurology

## 2023-12-07 DIAGNOSIS — G4489 Other headache syndrome: Secondary | ICD-10-CM

## 2023-12-07 DIAGNOSIS — I679 Cerebrovascular disease, unspecified: Secondary | ICD-10-CM

## 2023-12-07 MED ORDER — IOPAMIDOL (ISOVUE-370) INJECTION 76%
75.0000 mL | Freq: Once | INTRAVENOUS | Status: AC | PRN
  Administered 2023-12-07: 75 mL via INTRAVENOUS

## 2024-01-10 ENCOUNTER — Other Ambulatory Visit: Payer: Self-pay | Admitting: Neurology

## 2024-01-11 ENCOUNTER — Other Ambulatory Visit: Payer: Self-pay

## 2024-01-11 NOTE — Telephone Encounter (Signed)
 Pt Last Seen 08/06/2023 Upcoming Appointment 03/09/2024  Adderall Last filled 12/01/2023

## 2024-02-29 ENCOUNTER — Other Ambulatory Visit: Payer: Self-pay | Admitting: Neurology

## 2024-02-29 NOTE — Telephone Encounter (Signed)
 Dr.Camara you are work in provider  Last seen on 08/06/23 Follow up scheduled on 03/09/24   Dispensed Days Supply Quantity Provider Pharmacy  dextroamphetamine-amphetamine  ER 10 mg 24hr capsule,extend release 01/11/2024 30 30 each Sater, Charlie LABOR, MD [facility] [place], ...   Rx pending to be signed

## 2024-03-09 ENCOUNTER — Encounter: Payer: Self-pay | Admitting: Neurology

## 2024-03-09 ENCOUNTER — Ambulatory Visit: Admitting: Neurology

## 2024-03-09 VITALS — BP 132/85 | HR 68 | Ht 66.0 in | Wt 199.5 lb

## 2024-03-09 DIAGNOSIS — G35D Multiple sclerosis, unspecified: Secondary | ICD-10-CM

## 2024-03-09 DIAGNOSIS — R5383 Other fatigue: Secondary | ICD-10-CM

## 2024-03-09 DIAGNOSIS — G4489 Other headache syndrome: Secondary | ICD-10-CM

## 2024-03-09 DIAGNOSIS — R41840 Attention and concentration deficit: Secondary | ICD-10-CM

## 2024-03-09 DIAGNOSIS — Z79899 Other long term (current) drug therapy: Secondary | ICD-10-CM | POA: Diagnosis not present

## 2024-03-09 DIAGNOSIS — E559 Vitamin D deficiency, unspecified: Secondary | ICD-10-CM

## 2024-03-09 NOTE — Progress Notes (Signed)
 [facility]  PATIENT: Lindsey Phelps DOB: 1981-04-05  REFERRING DOCTOR OR PCP:  Jerilynn Carnes SOURCE: Patient, notes from Dr. Carnes, laboratory results.  _________________________________   HISTORICAL  CHIEF COMPLAINT:  Chief Complaint  Patient presents with   Multiple Sclerosis    Rm10, alone, MS Facial rash Flushing Multiple joint pain HA Flexural atopic dermatitis,  Intracranial vascular stenosis: pt stated that the fatigue has worsened, started seeing chiropractor to see if that provides relief    HISTORY OF PRESENT ILLNESS:  Lindsey Phelps is a 43 y.o. woman with relapsing remiting MS with multiple tumefactive lesions.  Update 03/09/2024: She is generally feeling well but notes more fatigue, and not feeling refreshed after sleep.  She also notes more hip pain on the left.   She does not snore and husband has not mentioned snoring or gasping at night.   She had her last infusion in June 2025.  She felt exhausted the day of the infusion but mostly back to normal 3 days later.    This was better than the previous infusion when she had a mild rash and felt poorly > 1 week.      Last year she had scans including CT Angiogram which was read as severe stenosis of the right PCA P2.   This looked more mild  to me.  She saw IR and repeat study was read as normal and maybe tortuous on prior study.   I advised her to stop the ASA daily.    Gait is mildly off-balanced and sme stumbles but no falls..  She has residual mild right leg weakness affecting gait at times since the 2019 episode    She has tingling and dysesthesias in the legs, right greater than left.  She has pain and tingling numbness in the hands.    NCV/EMG 07/19/2019 did not show any neuropathy or evidence of radiculopathy.   She has some urinary urgency but no incontinence.   She has mild cognitive issues - word finding difficulties at times and reduced STM/focus. Adderall helps.      She sometimes has  insmnia.   Valium  at night helps her sleep.   She still sometimes wakes up.   No snoring or witnessed OSA.  Mild EDS but rarely naps.    She has some anxiety and agoraphobia.     She has more fatigue.  Adderall has helped some with the fatigue but not much lately.  Had also helped with cognitive issues.   Dose was reduced to 10 mg last year due to s.e.   She is tolerating 10 mg very well and we discussed a small increase  She had a spell of presyncope last year but no recurrence.  Sometimes notes a few seconds of palpitation  MS History: In 03/31/2017 she experienced word finding difficulties and an MRI of the brain was performed concerning for a subacute left frontal stroke. There were a few other punctate T2/FLAIR hyperintense foci consistent with minimal chronic microvascular ischemic change.  Stroke work-up was performed.  I was most concerned about an embolic etiology. The carotid ultrasound was essentially normal. The cardiac event monitor was essentially normal.  A TEE showed a normal ejection fraction of 60-65 percent.   There was no evidence of a patent foramen ovale or other right-to-left shunt. The study was felt to be normal.  She is on aspirin , metoprolol  and Crestor .    A subsequent MRI 07/06/2017 showed multiple areas of juxtacortical edema and enhancement,  most consistent with demyelination.  A biopsy was considered but due to his improvement on the subsequent MRI, more consistent with demyelination then tumor, this was held.  She was started on Tysabri  07/2017.  MRI 11/10/2017 was stable.  Due to tolerability issues, she stopped May 2020 and started on [name] .  Switched to Ocrevus in 2024.    Imaging: MRI of the brain 04/07/2017 showed a partially enhancing focus in the left frontal lobe.  There were a couple punctate T2/FLAIR hyperintense foci elsewhere that were not nonspecific.  MRI of the brain 07/06/2017 showed multiple juxtacortical and periventricular enhancing lesions.  MRI of the  brain 07/28/2017 showed improvement of several lesions noted on the previous MRI though one of the foci was larger.  MRI of the brain 11/10/2017 showed no new lesions and resolution of the enhancement seen on the previous MRI  MRI of the brain 03/03/2018 showed no new lesions  MRI of the brain 11/03/2018 showed multiple juxtacortical and periventricular white matter foci.  None of these were new.  They do not enhance.  MRI of the cervical and thoracic spine 11/03/2018 showed a normal spinal cord.  MRI of the brain 05/10/2019 showed no new lesions.  MRI of the brain 05/09/2020 showed multiple T2/FLAIR hyperintense foci, predominantly in the juxtacortical white matter.  None of the foci enhances or appears to be acute.  Compared to the MRI dated 05/10/2019, there are no new lesions.   MRi of the brain 05/22/2021 was stable.    REVIEW OF SYSTEMS: Constitutional: No fevers, chills, sweats, or change in appetite.   She has mild sleep onset insomnia Eyes: No visual changes, double vision, eye pain Ear, nose and throat: No hearing loss, ear pain, nasal congestion, sore throat Cardiovascular: No chest pain.  Has intermittent tachycardia.  Respiratory:  No shortness of breath at rest or with exertion.   No wheezes GastrointestinaI: No nausea, vomiting, diarrhea, abdominal pain, fecal incontinence Genitourinary:  No dysuria, urinary retention or frequency.  No nocturia. Musculoskeletal: Neck pain as above.   Integumentary: No rash, pruritus, skin lesions Neurological: as above Psychiatric: No depression at this time.  H/o panic attacks. Endocrine: No palpitations, diaphoresis, change in appetite, change in weigh or increased thirst Hematologic/Lymphatic:  No anemia, purpura, petechiae. Allergic/Immunologic: has some seasonal allergies  ALLERGIES: Allergies  Allergen Reactions   Meperidine Hives   Propoxyphene Hives    nausea   Darvocet [Propoxyphene N-Acetaminophen ] Hives   Demerol Hives    Doxycycline Other (See Comments), Dermatitis, Hives and Rash    BLISTERS AND HIVES   Tape Other (See Comments), Rash and Dermatitis    Including bandaids - takes skin off, peels  welts  Including bandaids - takes skin off, peels  welts    HOME MEDICATIONS:  Current Outpatient Medications:    Acetaminophen  (TYLENOL  PO), Take by mouth as needed., Disp: , Rfl:    amphetamine -dextroamphetamine (ADDERALL XR) 10 MG 24 hr capsule, TAKE ONE CAPSULE BY MOUTH DAILY, Disp: 30 capsule, Rfl: 0   Cetirizine HCl (ZYRTEC ALLERGY) 10 MG CAPS, Take 10 mg by mouth daily., Disp: , Rfl:    Cholecalciferol (D3 ADULT PO), Take by mouth., Disp: , Rfl:    cyclobenzaprine  (FLEXERIL ) 5 MG tablet, Take up to 3 pills daily as needed, Disp: 90 tablet, Rfl: 5   diazepam  (VALIUM ) 10 MG tablet, Take 10 mg by mouth at bedtime as needed for anxiety. , Disp: , Rfl:    DUPIXENT  300 MG/2ML prefilled syringe, INJECT 300 MG (  1 SYRINGE) UNDER THE SKIN EVERY 2 WEEKS, Disp: 12 mL, Rfl: 3   Loperamide HCl (IMODIUM PO), Take 1 Dose by mouth as needed., Disp: , Rfl:    metroNIDAZOLE (METROGEL) 1 % gel, , Disp: , Rfl:    Ocrelizumab (OCREVUS IV), Inject into the vein. Infusion twice per year for MS, Disp: , Rfl:    promethazine  (PHENERGAN ) 25 MG tablet, Take 1 tablet (25 mg total) by mouth 2 (two) times daily as needed for nausea or vomiting., Disp: 60 tablet, Rfl: 5  Current Facility-Administered Medications:    dupilumab  (DUPIXENT ) prefilled syringe 300 mg, 300 mg, Subcutaneous, Q14 Days, Iva Marty Saltness, MD, 300 mg at 07/28/22 1459  Facility-Administered Medications Ordered in Other Visits:    gadopentetate dimeglumine  (MAGNEVIST ) injection 18 mL, 18 mL, Intravenous, Once PRN, Lestat Golob, Charlie LABOR, MD   gadopentetate dimeglumine  (MAGNEVIST ) injection 18 mL, 18 mL, Intravenous, Once PRN, Farooq Petrovich, Charlie LABOR, MD  PAST MEDICAL HISTORY: Past Medical History:  Diagnosis Date   Anxiety    Heart palpitations    History of IBS     Hyperlipidemia    Hypertension    Multiple sclerosis    Stroke [facility])     PAST SURGICAL HISTORY: Past Surgical History:  Procedure Laterality Date   ABDOMINAL HYSTERECTOMY     BLADDER NECK SUSPENSION     CHOLECYSTECTOMY     COLONOSCOPY  01/23/2011   Rehman, normal   IR RADIOLOGIST EVAL & MGMT  07/16/2023   LAPAROSCOPIC TUBAL LIGATION     TEE WITHOUT CARDIOVERSION N/A 05/05/2017   Procedure: TRANSESOPHAGEAL ECHOCARDIOGRAM (TEE);  Surgeon: Charls Pearla LABOR, MD;  Location: [facility];  Service: Cardiovascular;  Laterality: N/A;    FAMILY HISTORY: Family History  Problem Relation Age of Onset   Asthma Mother    Hypertension Mother    Coronary artery disease Mother    COPD Mother    Diabetes Father    Hypertension Father    Colon polyps Father    Lung cancer Father    Allergic rhinitis Brother     SOCIAL HISTORY:  Social History   Socioeconomic History   Marital status: Married    Spouse name: Not on file   Number of children: Not on file   Years of education: Not on file   Highest education level: Not on file  Occupational History   Occupation: on disability  Tobacco Use   Smoking status: Former    Current packs/day: 0.00    Average packs/day: 0.5 packs/day for 15.0 years (7.5 ttl pk-yrs)    Types: Cigarettes    Start date: 09/16/1997    Quit date: 09/16/2012    Years since quitting: 11.4   Smokeless tobacco: Never  Vaping Use   Vaping status: Never Used  Substance and Sexual Activity   Alcohol use: Not Currently    Comment: occ   Drug use: No   Sexual activity: Yes    Birth control/protection: Surgical  Other Topics Concern   Not on file  Social History Narrative   Right Handed   2-3 Sodas per day   Social Drivers of Health   Financial Resource Strain: Not on file  Food Insecurity: Not on file  Transportation Needs: Not on file  Physical Activity: Inactive (06/12/2020)   [facility]   Exercise Vital Sign    On average, how  many days per week do you engage in moderate to strenuous exercise (like a brisk walk)?: 0 days  On average, how many minutes do you engage in exercise at this level?: 0 min  Stress: Not on file  Social Connections: Not on file  Intimate Partner Violence: Not At Risk (06/12/2020)   Received from [facility]   Humiliation, Afraid, Rape, and Kick questionnaire    Within the last year, have you been afraid of your partner or ex-partner?: No    Within the last year, have you been humiliated or emotionally abused in other ways by your partner or ex-partner?: No    Within the last year, have you been kicked, hit, slapped, or otherwise physically hurt by your partner or ex-partner?: No    Within the last year, have you been raped or forced to have any kind of sexual activity by your partner or ex-partner?: No     PHYSICAL EXAM  Vitals:   03/09/24 1353  BP: 132/85  Pulse: 68  SpO2: 99%  Weight: 199 lb 8 oz (90.5 kg)  Height: 5' 6 (1.676 m)    Body mass index is 32.2 kg/m.   General: The patient is well-developed and well-nourished and in no acute distress.   She has mild tenderness over the occiput      Neurologic Exam  Mental status: She is alert and oriented x 3 at the time of the examination. The patient has apparent normal recent and remote memory, with an apparently normal attention span and concentration ability.  Speech was fine.    Cranial nerves:  Color vision is mildly desaturated OD.  Acuity is more symmetric. SABRA  Extraocular movements are full.  Facial strength and sensation was normal.. No obvious hearing deficits are noted.  Motor:  Muscle bulk is normal.   Tone is normal.  Strength 5/5.  I Sensory:   She has mildly reduced vibratoin sensation right vs left.    Coordination: Cerebellar testing reveals good finger-nose-finger and left heel-to-shin.  She has reduced right heel-to-shin.  Gait and station: Station is normal.  Her gait is mildly wide.  The tandem gait  is wide.  Romberg is negative.  Reflexes: Deep tendon reflexes are symmetric and slightly increased in the arms, increased at the knees with crossed adductors, right >left.  She does not have ankle clonus.        DIAGNOSTIC DATA (LABS, IMAGING, TESTING) - I reviewed patient records, labs, notes, testing and imaging myself where available.  Lab Results  Component Value Date   WBC 6.0 06/15/2023   HGB 14.2 06/15/2023   HCT 40.8 06/15/2023   MCV 88.7 06/15/2023   PLT 322 06/15/2023      Component Value Date/Time   NA 137 06/15/2023 2058   NA 143 05/02/2022 1516   K 3.3 (L) 06/15/2023 2058   CL 105 06/15/2023 2058   CO2 23 06/15/2023 2058   GLUCOSE 95 06/15/2023 2058   BUN 9 06/15/2023 2058   BUN 14 05/02/2022 1516   CREATININE 0.69 06/15/2023 2058   CALCIUM  9.5 06/15/2023 2058   PROT 8.1 06/15/2023 2058   PROT 6.7 05/02/2022 1516   ALBUMIN 4.8 06/15/2023 2058   ALBUMIN 4.3 05/02/2022 1516   AST 27 06/15/2023 2058   ALT 20 06/15/2023 2058   ALKPHOS 95 06/15/2023 2058   BILITOT 0.8 06/15/2023 2058   BILITOT 0.3 05/02/2022 1516   GFRNONAA >60 06/15/2023 2058   GFRAA 107 04/30/2020 1147       ASSESSMENT AND PLAN  1. Multiple sclerosis   2. High risk medication use  3. Other headache syndrome   4. Other fatigue   5. Attention or concentration deficit       1.  Continue Ocrevus.  We will check IgG/IgM and CBC/differential.    2.  Continue Adderall XR for MS fatigue and related reduced focus, last filled for 10 mg but we can go back up to 15 mg if patient feels that dose worked better for her.  .  3.  Stay active and exercise as tolerated. 4.  return to see me in 7 months or sooner if there are new or worsening neurologic symptoms.       This visit is part of a comprehensive longitudinal care medical relationship regarding the patients primary diagnosis of MS and related concerns.   Azaryah Heathcock A. Vear, MD, PhD, FAAN Certified in Neurology, Clinical  Neurophysiology, Sleep Medicine, Pain Medicine and Neuroimaging Director, Multiple Sclerosis Center at [facility]  [facility] [address], [address] [place], [place] [address] [phone]

## 2024-03-10 ENCOUNTER — Ambulatory Visit: Payer: Self-pay | Admitting: Neurology

## 2024-03-10 LAB — CBC WITH DIFFERENTIAL/PLATELET
Basophils Absolute: 0.1 x10E3/uL (ref 0.0–0.2)
Basos: 1 %
EOS (ABSOLUTE): 0.2 x10E3/uL (ref 0.0–0.4)
Eos: 4 %
Hematocrit: 41.2 % (ref 34.0–46.6)
Hemoglobin: 13.6 g/dL (ref 11.1–15.9)
Immature Grans (Abs): 0 x10E3/uL (ref 0.0–0.1)
Immature Granulocytes: 0 %
Lymphocytes Absolute: 1.8 x10E3/uL (ref 0.7–3.1)
Lymphs: 41 %
MCH: 31 pg (ref 26.6–33.0)
MCHC: 33 g/dL (ref 31.5–35.7)
MCV: 94 fL (ref 79–97)
Monocytes Absolute: 0.3 x10E3/uL (ref 0.1–0.9)
Monocytes: 7 %
Neutrophils Absolute: 2.1 x10E3/uL (ref 1.4–7.0)
Neutrophils: 46 %
Platelets: 261 x10E3/uL (ref 150–450)
RBC: 4.39 x10E6/uL (ref 3.77–5.28)
RDW: 12.4 % (ref 11.7–15.4)
WBC: 4.4 x10E3/uL (ref 3.4–10.8)

## 2024-03-10 LAB — IGG, IGA, IGM
IgA/Immunoglobulin A, Serum: 107 mg/dL (ref 87–352)
IgG (Immunoglobin G), Serum: 900 mg/dL (ref 586–1602)
IgM (Immunoglobulin M), Srm: 33 mg/dL (ref 26–217)

## 2024-03-10 LAB — VITAMIN D 25 HYDROXY (VIT D DEFICIENCY, FRACTURES): Vit D, 25-Hydroxy: 21.3 ng/mL — ABNORMAL LOW (ref 30.0–100.0)

## 2024-03-10 MED ORDER — VITAMIN D (ERGOCALCIFEROL) 1.25 MG (50000 UNIT) PO CAPS: 50000.0000 [IU] | ORAL_CAPSULE | ORAL | 1 refills | Status: AC

## 2024-04-01 ENCOUNTER — Other Ambulatory Visit: Payer: Self-pay | Admitting: Allergy & Immunology

## 2024-04-07 ENCOUNTER — Other Ambulatory Visit: Payer: Self-pay | Admitting: Neurology

## 2024-04-08 NOTE — Telephone Encounter (Signed)
 LAST OV 03/26/24 LAST FILLED 02/29/24 QTY 30 DAY SUPPLY 30 days NEXT OV 10/17/24 PROVIDER  salter

## 2024-05-10 ENCOUNTER — Telehealth: Payer: Self-pay | Admitting: *Deleted

## 2024-05-10 NOTE — Telephone Encounter (Signed)
 Received report from [facility] Radiology of positive HSV Viral Culture. Pt currently on Valacyclovir 1 gram every 12 hours x 7 days. Dermatology is asking if patient needs to be on preventive Valacyclovir due to her Ocrevus. Fax has been placed in Dr Duncan office for review and response.

## 2024-05-11 NOTE — Telephone Encounter (Signed)
 From Dr Vear: If she is having frequent breakouts, then chronic treatment can be considered. If just a single episode we usually do not treat with valacyclovir past the acute stage   He wrote back a note on dermatology fax and I have faxed this back to [facility] @ [phone]. Received a receipt of confirmation.

## 2024-05-19 ENCOUNTER — Other Ambulatory Visit: Payer: Self-pay | Admitting: Neurology

## 2024-05-19 NOTE — Telephone Encounter (Signed)
 Medication: Adderall Directions: TAKE ONE CAPSULE BY MOUTH EVERY MORNING,  Cherre given: 04/08/2024 Number refills: 0 Last o/v:  Follow up:  Labs:    Please review refill request.

## 2024-07-06 ENCOUNTER — Other Ambulatory Visit: Payer: Self-pay | Admitting: Neurology

## 2024-07-06 NOTE — Telephone Encounter (Signed)
 Requested Prescriptions   Pending Prescriptions Disp Refills   amphetamine -dextroamphetamine (ADDERALL XR) 15 MG 24 hr capsule [Pharmacy Med Name: dextroamphetamine-amphetamine  ER 15 mg 24hr capsule,extend release] 30 capsule 0    Sig: TAKE ONE CAPSULE BY MOUTH EVERY MORNING   Last seen 03/09/24 Next appt 10/17/24  Dispenses   Dispensed Days Supply Quantity Provider Pharmacy  dextroamphetamine-amphetamine  ER 15 mg 24hr capsule,extend release 05/19/2024 30 30 each Onita Duos, MD [facility] [place], ...  dextroamphetamine-amphetamine  ER 15 mg 24hr capsule,extend release 04/08/2024 30 30 each Onita Duos, MD [facility] [place], ...  dextroamphetamine-amphetamine  ER 10 mg 24hr capsule,extend release 02/29/2024 30 30 each Gregg Lek, MD [facility] [place], ...  dextroamphetamine-amphetamine  ER 10 mg 24hr capsule,extend release 01/11/2024 30 30 each Sater, Charlie LABOR, MD [facility] [place], ...  dextroamphetamine-amphetamine  ER 10 mg 24hr capsule,extend release 12/01/2023 30 30 each Sater, Charlie LABOR, MD [facility] [place], ...  dextroamphetamine-amphetamine  ER 10 mg 24hr capsule,extend release 10/27/2023 30 30 each Sater, Charlie LABOR, MD [facility] [place], ...  dextroamphetamine-amphetamine  ER 10 mg 24hr capsule,extend release 09/24/2023 30 30 each Sater, Charlie LABOR, MD [facility] [place], ...  dextroamphetamine-amphetamine  ER 10 mg 24hr capsule,extend release 08/06/2023 30 30 each Sater, Charlie LABOR, MD [facility] [place], .SABRASABRA

## 2024-10-05 ENCOUNTER — Ambulatory Visit: Admitting: Allergy & Immunology

## 2024-10-17 ENCOUNTER — Ambulatory Visit: Admitting: Neurology

## 2024-11-07 ENCOUNTER — Ambulatory Visit: Admitting: Family Medicine
